# Patient Record
Sex: Male | Born: 1960 | Race: White | Hispanic: No | Marital: Single | Smoking: Never smoker
Health system: Southern US, Community
[De-identification: ages and names within clinical notes are randomized; demographics above are authoritative.]

## PROBLEM LIST (undated history)

## (undated) DIAGNOSIS — G4730 Sleep apnea, unspecified: Secondary | ICD-10-CM

## (undated) DIAGNOSIS — E785 Hyperlipidemia, unspecified: Secondary | ICD-10-CM

## (undated) DIAGNOSIS — I6529 Occlusion and stenosis of unspecified carotid artery: Secondary | ICD-10-CM

## (undated) DIAGNOSIS — Z8709 Personal history of other diseases of the respiratory system: Secondary | ICD-10-CM

## (undated) DIAGNOSIS — I2510 Atherosclerotic heart disease of native coronary artery without angina pectoris: Secondary | ICD-10-CM

## (undated) DIAGNOSIS — E119 Type 2 diabetes mellitus without complications: Secondary | ICD-10-CM

## (undated) DIAGNOSIS — K219 Gastro-esophageal reflux disease without esophagitis: Secondary | ICD-10-CM

## (undated) DIAGNOSIS — J329 Chronic sinusitis, unspecified: Secondary | ICD-10-CM

## (undated) DIAGNOSIS — Z8669 Personal history of other diseases of the nervous system and sense organs: Secondary | ICD-10-CM

## (undated) DIAGNOSIS — J45909 Unspecified asthma, uncomplicated: Secondary | ICD-10-CM

## (undated) DIAGNOSIS — K5090 Crohn's disease, unspecified, without complications: Secondary | ICD-10-CM

## (undated) DIAGNOSIS — I10 Essential (primary) hypertension: Secondary | ICD-10-CM

## (undated) DIAGNOSIS — L509 Urticaria, unspecified: Secondary | ICD-10-CM

## (undated) HISTORY — PX: OTHER SURGICAL HISTORY: SHX169

## (undated) HISTORY — PX: CAROTID ENDARTERECTOMY: SUR193

## (undated) HISTORY — DX: Urticaria, unspecified: L50.9

---

## 2019-10-05 NOTE — Progress Notes (Addendum)
New Bryan Lopez Note  RE: Bryan Lopez MRN: [id number] DOB: Aug 26, 1960 Date of Office Visit: 10/06/2019  Referring provider: No ref. provider found Primary care provider: Patient, [name]  Chief Complaint: Establish Care (needs an immunologist)  History of Present Illness: I had the pleasure of seeing Bryan Lopez for initial evaluation at the [facility] on 10/08/2019. He is a 59 y.o. male, who is self-referred here for the evaluation of establishing care with an allergist. Up to date with COVID-19 vaccine: no  Bryan Lopez just moved from [place] and was seen by allergy/immunology for chronic bronchitis, asthma on Dupixent, and sinusitis - in process of obtaining records.   Breathing:  He reports symptoms of chest tightness, shortness of breath, coughing (whitish to clear/yellow), wheezing, nocturnal awakenings for 3-4 years. Current medications include Trelegy 200 1 puff QD x 1 month, Singulair QHS and albuterol prn which help. He reports not using aerochamber with inhalers. He tried the following inhalers: Symbicort. Main triggers are hot weather, exertion. In the last month, frequency of symptoms: 1-3x/day. Frequency of nocturnal symptoms: nightly. Frequency of SABA use: 1-3x/day. Interference with physical activity: yes. Sleep is disturbed. In the last 12 months, emergency room visits/urgent care visits/doctor office visits or hospitalizations due to respiratory issues: every 2 weeks. In the last 12 months, oral steroids courses: 7-8 times with minimal benefit. Lifetime history of hospitalization for respiratory issues: yes about 1-2 years ago. Prior intubations: no. History of pneumonia: yes. He was evaluated by allergist/pulmonologist in the past. Smoking exposure: no. Up to date with flu vaccine: yes. Up to date with pneumonia vaccine: yes. Up to date with COVID-19 vaccine: no.  History of reflux: yes and takes Protonix daily.  Had recent CXR per Bryan Lopez report.   Bryan Lopez  started Dupixent injections in [place] every 2 weeks and had 3 injections to date with unknown benefit. He is due for his next injection.   Rhinitis: He reports symptoms of nasal congestion, rhinorrhea, PND, sneezing and slight itchy/watery eyes. Symptoms have been going on for 3-4 years. The symptoms are present all year around. Anosmia: diminished sense of smell. Headache: yes. He has used Flonase, Singulair, Careers adviser  with some improvement in symptoms. Sinus infections: yes. Previous work up includes: skin testing a few months ago was positive to ragweed, grass, dust mites and cockroach per Bryan Lopez report. No previous allergy injections.  Previous ENT evaluation: yes 6-7 surgeries Previous sinus imaging: yes. History of nasal polyps: no. Last eye exam: 4 months ago.  Infections:  Bryan Lopez has history multiple infections including sinus infection, pneumonia, bronchitis, ear infections. Denies any GI infections, skin infections/abscesses. Bryan Lopez has no history of opportunistic infections including fungal infections, viral infections.   Bryan Lopez reports 6-8 antibiotic use in the last 12 months.  Bryan Lopez has diabetes, heart disease s/p cardiac stents, hyperlipidemia, hypertension, Crohn's disease.   Bryan Lopez does not have any secondary causes of immunodeficiency including chronic steroid use, protein losing enteropathy, renal or hepatic dysfunction, history of cancer or irradiation or history of HIV, hepatitis B or C.  Bryan Lopez was told to get his pneumonia vaccine by allergy.   Assessment and Plan: Daymien is a 59 y.o. male with: Not well controlled severe persistent asthma Diagnosed with asthma 3-4 years ago. Currently on Trelegy 200 1 puff daily x 1 month, Singulair, albuterol prn. Recently started on Dupixent injections in [place] and due for his injection. 7-8 courses of prednisone per year with minimal benefit. Has reflux and takes Protonix. Not  up to date with COVID-19 vaccine. Had recent CXR -  awaiting records from previous allergist.   Today's spirometry showed some restriction.  Discussed with Bryan Lopez at length that I need to review his previous allergist's records and I may order some additional testing/bloodwork depending on what was done to date.  Will also call him about re-starting Dupixent injections.  . Daily controller medication(s): continue with Trelegy 1 puff daily and rinse mouth afterwards. . May use albuterol rescue inhaler 2 puffs every 4 to 6 hours as needed for shortness of breath, chest tightness, coughing, and wheezing. May use albuterol rescue inhaler 2 puffs 5 to 15 minutes prior to strenuous physical activities. Monitor frequency of use.  . Repeat spirometry at next visit. . Will check when last CXR was ordered.  . If no full body PFT - will need to get one in future. . Check for alpha-1 antitrypsin level as well.  . Use flutter valve to help with mucous clearance.  History of frequent upper respiratory infection Frequent sinusitis, pneumonia, bronchitis and ear infections. 6-8 courses of antibiotics the last 12 months. It sounds like his previous allergist may have done some basic immune bloodwork - awaiting records.  Depending on what was done, we may have to get additional bloodwork.  Okay to get the pneumonia vaccine and will check titers 4 weeks afterwards  Asked Bryan Lopez to hold given notes from allergist. Will check bloodwork first.   Keep track of infections.   Other allergic rhinitis Perennial rhinoconjunctivitis symptoms for the past 3 to 4 years.  Used Flonase, Singulair and Allegra with some benefit.  Apparently he had 6-7 sinus surgeries due to MRSA infection.  No history of polyps. skin testing a few months ago was positive to ragweed, grass, dust mites and cockroach per Bryan Lopez report.  No previous allergy immunotherapy.  Start environmental control measures as below.  May use over the counter antihistamines such as Zyrtec  (cetirizine), Claritin (loratadine), Allegra (fexofenadine), or Xyzal (levocetirizine) daily as needed.  May use Flonase 1 spray per nostril 1-2 times a day for nasal congestion.  Continue with Singulair daily.  Nasal saline spray (i.e., Simply Saline) or nasal saline lavage (i.e., NeilMed) is recommended as needed and prior to medicated nasal sprays.  Urticaria History of hives outbreak about 5 years ago with no known triggers.  Monitor symptoms and if has a recurrence then will do additional work-up at that time.  Return in about 4 weeks (around 11/03/2019).   Advised Bryan Lopez to establish care with a PCP in [place] as soon as possible as he has a complicated medical history.   ADDENDUM: Received some records from previous allergist Dr. Margit Banda from [place]. Severe persistent asthma with nasal polyposis. Started on Dupixent but issues with insurance.  Frequent infections needing 10+ courses of antibiotics and 7 courses of prednisone within 12 month.  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  Consider starting on IgG replacement therapy.   Other allergy screening: Food allergy: no Medication allergy:  Metformin - GI symptoms.  Hymenoptera allergy: no Urticaria:  5 years ago had hives with no known triggers noted.  Eczema:no  Diagnostics: Spirometry:  Tracings reviewed. His effort: Good reproducible efforts. FVC: 3.13L FEV1: 2.67L, 84% predicted FEV1/FVC ratio: 85% Interpretation: Spirometry consistent with possible restrictive disease.  Please see scanned spirometry results for details.  Skin Testing: Deferred due to recent antihistamines use.  Past Medical History: Bryan Lopez  Active Problem List   Diagnosis Date Noted  . History of frequent upper respiratory infection 10/07/2019  . Other allergic rhinitis 10/07/2019  . Urticaria 10/07/2019  . Not well controlled severe persistent asthma 10/06/2019   Past  Medical History:  Diagnosis Date  . Urticaria    Past Surgical History: History reviewed. No pertinent surgical history. Medication List:  Current Outpatient Medications  Medication Sig Dispense Refill  . albuterol (VENTOLIN HFA) 108 (90 Base) MCG/ACT inhaler     . atorvastatin (LIPITOR) 80 MG tablet Take 80 mg by mouth daily.    . DUPIXENT 300 MG/2ML prefilled syringe     . EPINEPHrine 0.3 mg/0.3 mL IJ SOAJ injection     . fentaNYL (DURAGESIC) 50 MCG/HR 1 patch every 3 (three) days.    . fluticasone (FLONASE) 50 MCG/ACT nasal spray Place 1 spray into both nostrils 2 (two) times daily.    Marland Kitchen gabapentin (NEURONTIN) 600 MG tablet     . JARDIANCE 25 MG TABS tablet Take 25 mg by mouth daily.    . methylPREDNISolone (MEDROL DOSEPAK) 4 MG TBPK tablet     . montelukast (SINGULAIR) 10 MG tablet Take 10 mg by mouth daily.    . nortriptyline (PAMELOR) 25 MG capsule Take 25 mg by mouth at bedtime.    . ondansetron (ZOFRAN) 8 MG tablet     . ONETOUCH ULTRA test strip 1 each daily.    . Oxycodone HCl 10 MG TABS Take 10 mg by mouth 5 (five) times daily as needed.    . pantoprazole (PROTONIX) 40 MG tablet Take 40 mg by mouth daily.    Viviana Simpler ELLIPTA 200-62.5-25 MCG/INH AEPB     . valsartan (DIOVAN) 160 MG tablet Take 160 mg by mouth daily.     No current facility-administered medications for this visit.   Allergies: No Known Allergies Social History: Social History   Socioeconomic History  . Marital status: Single    Spouse name: Not on file  . Number of children: Not on file  . Years of education: Not on file  . Highest education level: Not on file  Occupational History  . Not on file  Tobacco Use  . Smoking status: Never Smoker  . Smokeless tobacco: Never Used  Substance and Sexual Activity  . Alcohol use: Never  . Drug use: Never  . Sexual activity: Not on file  Other Topics Concern  . Not on file  Social History Narrative  . Not on file   Social Determinants of Health    Financial Resource Strain:   . Difficulty of Paying Living Expenses:   Food Insecurity:   . Worried About Charity fundraiser in the Last Year:   . Arboriculturist in the Last Year:   Transportation Needs:   . Film/video editor (Medical):   Marland Kitchen Lack of Transportation (Non-Medical):   Physical Activity:   . Days of Exercise per Week:   . Minutes of Exercise per Session:   Stress:   . Feeling of Stress :   Social Connections:   . Frequency of Communication with Friends and Family:   . Frequency of Social Gatherings with Friends and Family:   . Attends Religious Services:   . Active Member of Clubs or Organizations:   . Attends Archivist Meetings:   Marland Kitchen Marital Status:    Lives in a motel currently. Smoking: denies Occupation: tool and Set designer History: Water Damage/mildew in the house: no  Carpet in the family room: no Carpet in the bedroom: no Heating: heat pump Cooling: central Pet: 1 dog x 3 yrs  Family History: Family History  Problem Relation Age of Onset  . Asthma Father   . Allergic rhinitis Neg Hx   . Atopy Neg Hx    Review of Systems  Constitutional: Negative for appetite change, chills, fever and unexpected weight change.  HENT: Positive for congestion, postnasal drip, rhinorrhea and sneezing.   Eyes: Positive for itching.  Respiratory: Positive for cough, chest tightness, shortness of breath and wheezing.   Cardiovascular: Negative for chest pain.  Gastrointestinal: Negative for abdominal pain.  Genitourinary: Negative for difficulty urinating.  Skin: Negative for rash.  Allergic/Immunologic: Positive for environmental allergies. Negative for food allergies.  Neurological: Positive for headaches.   Objective: BP 140/82 (BP Location: Right Arm, Bryan Lopez Position: Sitting, Cuff Size: Normal)   Pulse 100   Temp 98.2 F (36.8 C) (Temporal)   Resp 16   Ht 5' 7.13" (1.705 m)   Wt 196 lb (88.9 kg)   SpO2 96%   BMI 30.58  kg/m  Body mass index is 30.58 kg/m. Physical Exam Vitals and nursing note reviewed.  Constitutional:      Appearance: Normal appearance. He is well-developed.  HENT:     Head: Normocephalic and atraumatic.     Right Ear: Tympanic membrane and external ear normal.     Left Ear: Tympanic membrane and external ear normal.     Nose: Nose normal. No congestion or rhinorrhea.     Mouth/Throat:     Mouth: Mucous membranes are moist.     Pharynx: Oropharynx is clear.  Eyes:     Conjunctiva/sclera: Conjunctivae normal.  Cardiovascular:     Rate and Rhythm: Normal rate and regular rhythm.     Heart sounds: Normal heart sounds. No murmur heard.  No friction rub. No gallop.   Pulmonary:     Effort: Pulmonary effort is normal.     Breath sounds: Rhonchi present. No wheezing or rales.  Abdominal:     Palpations: Abdomen is soft.  Musculoskeletal:     Cervical back: Neck supple.  Skin:    General: Skin is warm.     Findings: No rash.  Neurological:     Mental Status: He is alert and oriented to person, place, and time.  Psychiatric:        Behavior: Behavior normal.    The plan was reviewed with the Bryan Lopez/family, and all questions/concerned were addressed.  It was my pleasure to see Jazziel today and participate in his care. Please feel free to contact me with any questions or concerns.  Sincerely,  Wyline Mood, DO Allergy & Immunology  [facility] office: [phone] [facility] office: [phone] [place] office: [phone]

## 2019-10-06 ENCOUNTER — Other Ambulatory Visit: Payer: Self-pay

## 2019-10-06 ENCOUNTER — Encounter: Payer: Self-pay | Admitting: Allergy

## 2019-10-06 ENCOUNTER — Ambulatory Visit (INDEPENDENT_AMBULATORY_CARE_PROVIDER_SITE_OTHER): Payer: BC Managed Care – PPO | Admitting: Allergy

## 2019-10-06 VITALS — BP 140/82 | HR 100 | Temp 98.2°F | Resp 16 | Ht 67.13 in | Wt 196.0 lb

## 2019-10-06 DIAGNOSIS — L509 Urticaria, unspecified: Secondary | ICD-10-CM | POA: Diagnosis not present

## 2019-10-06 DIAGNOSIS — J3089 Other allergic rhinitis: Secondary | ICD-10-CM

## 2019-10-06 DIAGNOSIS — J4550 Severe persistent asthma, uncomplicated: Secondary | ICD-10-CM | POA: Diagnosis not present

## 2019-10-06 DIAGNOSIS — Z8709 Personal history of other diseases of the respiratory system: Secondary | ICD-10-CM

## 2019-10-06 NOTE — Patient Instructions (Addendum)
I will review your previous allergist's records and depending on what they ordered, I may order additional bloodwork.  Okay to get second pneumonia vaccine.  Keep track of infections.   We will call you Thursday about the Dupixent injecion.   Asthma: . Daily controller medication(s): continue with Trelegy 1 puff daily and rinse mouth afterwards. . May use albuterol rescue inhaler 2 puffs every 4 to 6 hours as needed for shortness of breath, chest tightness, coughing, and wheezing. May use albuterol rescue inhaler 2 puffs 5 to 15 minutes prior to strenuous physical activities. Monitor frequency of use.  . Asthma control goals:  o Full participation in all desired activities (may need albuterol before activity) o Albuterol use two times or less a week on average (not counting use with activity) o Cough interfering with sleep two times or less a month o Oral steroids no more than once a year o No hospitalizations  Environmental allergies  Start environmental control measures as below.  May use over the counter antihistamines such as Zyrtec (cetirizine), Claritin (loratadine), Allegra (fexofenadine), or Xyzal (levocetirizine) daily as needed.  May use Flonase 1 spray per nostril 1-2 times a day for nasal congestion.  Continue with Singulair daily.  Nasal saline spray (i.e., Simply Saline) or nasal saline lavage (i.e., NeilMed) is recommended as needed and prior to medicated nasal sprays.  Follow up in 1 months or sooner if needed.   Please establish care with primary care in [place].   Reducing Pollen Exposure . Pollen seasons: trees (spring), grass (summer) and ragweed/weeds (fall). Marland Kitchen Keep windows closed in your home and car to lower pollen exposure.  Lilian Kapur air conditioning in the bedroom and throughout the house if possible.  . Avoid going out in dry windy days - especially early morning. . Pollen counts are highest between 5 - 10 AM and on dry, hot and windy days.  . Save  outside activities for late afternoon or after a heavy rain, when pollen levels are lower.  . Avoid mowing of grass if you have grass pollen allergy. Marland Kitchen Be aware that pollen can also be transported indoors on people and pets.  . Dry your clothes in an automatic dryer rather than hanging them outside where they might collect pollen.  . Rinse hair and eyes before bedtime.  Control of House Dust Mite Allergen . Dust mite allergens are a common trigger of allergy and asthma symptoms. While they can be found throughout the house, these microscopic creatures thrive in warm, humid environments such as bedding, upholstered furniture and carpeting. . Because so much time is spent in the bedroom, it is essential to reduce mite levels there.  . Encase pillows, mattresses, and box springs in special allergen-proof fabric covers or airtight, zippered plastic covers.  . Bedding should be washed weekly in hot water (130 F) and dried in a hot dryer. Allergen-proof covers are available for comforters and pillows that can't be regularly washed.  Reyes Ivan the allergy-proof covers every few months. Minimize clutter in the bedroom. Keep pets out of the bedroom.  Marland Kitchen Keep humidity less than 50% by using a dehumidifier or air conditioning. You can buy a humidity measuring device called a hygrometer to monitor this.  . If possible, replace carpets with hardwood, linoleum, or washable area rugs. If that's not possible, vacuum frequently with a vacuum that has a HEPA filter. . Remove all upholstered furniture and non-washable window drapes from the bedroom. . Remove all non-washable stuffed toys from  the bedroom.  Wash stuffed toys weekly.  Cockroach Allergen Avoidance Cockroaches are often found in the homes of densely populated urban areas, schools or commercial buildings, but these creatures can lurk almost anywhere. This does not mean that you have a dirty house or living area. . Block all areas where roaches can enter the  home. This includes crevices, wall cracks and windows.  . Cockroaches need water to survive, so fix and seal all leaky faucets and pipes. Have an exterminator go through the house when your family and pets are gone to eliminate any remaining roaches. Marland Kitchen Keep food in lidded containers and put pet food dishes away after your pets are done eating. Vacuum and sweep the floor after meals, and take out garbage and recyclables. Use lidded garbage containers in the kitchen. Wash dishes immediately after use and clean under stoves, refrigerators or toasters where crumbs can accumulate. Wipe off the stove and other kitchen surfaces and cupboards regularly.

## 2019-10-07 DIAGNOSIS — J3089 Other allergic rhinitis: Secondary | ICD-10-CM | POA: Insufficient documentation

## 2019-10-07 DIAGNOSIS — Z8709 Personal history of other diseases of the respiratory system: Secondary | ICD-10-CM | POA: Insufficient documentation

## 2019-10-07 DIAGNOSIS — L509 Urticaria, unspecified: Secondary | ICD-10-CM | POA: Insufficient documentation

## 2019-10-07 NOTE — Assessment & Plan Note (Addendum)
Frequent sinusitis, pneumonia, bronchitis and ear infections. 6-8 courses of antibiotics the last 12 months. It sounds like his previous allergist may have done some basic immune bloodwork - awaiting records.  Depending on what was done, we may have to get additional bloodwork.  Okay to get the pneumonia vaccine and will check titers 4 weeks afterwards  Asked patient to hold given notes from allergist. Will check bloodwork first.   Keep track of infections.

## 2019-10-07 NOTE — Assessment & Plan Note (Signed)
Perennial rhinoconjunctivitis symptoms for the past 3 to 4 years.  Used Flonase, Singulair and Allegra with some benefit.  Apparently he had 6-7 sinus surgeries due to MRSA infection.  No history of polyps. skin testing a few months ago was positive to ragweed, grass, dust mites and cockroach per patient report.  No previous allergy immunotherapy.  Start environmental control measures as below.  May use over the counter antihistamines such as Zyrtec (cetirizine), Claritin (loratadine), Allegra (fexofenadine), or Xyzal (levocetirizine) daily as needed.  May use Flonase 1 spray per nostril 1-2 times a day for nasal congestion.  Continue with Singulair daily.  Nasal saline spray (i.e., Simply Saline) or nasal saline lavage (i.e., NeilMed) is recommended as needed and prior to medicated nasal sprays.

## 2019-10-07 NOTE — Assessment & Plan Note (Signed)
History of hives outbreak about 5 years ago with no known triggers.  Monitor symptoms and if has a recurrence then will do additional work-up at that time.

## 2019-10-07 NOTE — Assessment & Plan Note (Signed)
Diagnosed with asthma 3-4 years ago. Currently on Trelegy 200 1 puff daily x 1 month, Singulair, albuterol prn. Recently started on Dupixent injections in [place] and due for his injection. 7-8 courses of prednisone per year with minimal benefit. Has reflux and takes Protonix. Not up to date with COVID-19 vaccine. Had recent CXR - awaiting records from previous allergist.   Today's spirometry showed some restriction.  Discussed with patient at length that I need to review his previous allergist's records and I may order some additional testing/bloodwork depending on what was done to date.  Will also call him about re-starting Dupixent injections.  . Daily controller medication(s): continue with Trelegy 1 puff daily and rinse mouth afterwards. . May use albuterol rescue inhaler 2 puffs every 4 to 6 hours as needed for shortness of breath, chest tightness, coughing, and wheezing. May use albuterol rescue inhaler 2 puffs 5 to 15 minutes prior to strenuous physical activities. Monitor frequency of use.  . Repeat spirometry at next visit. . Will check when last CXR was ordered.  . If no full body PFT - will need to get one in future. . Check for alpha-1 antitrypsin level as well.  . Use flutter valve to help with mucous clearance.

## 2019-10-08 ENCOUNTER — Other Ambulatory Visit: Payer: Self-pay

## 2019-10-08 ENCOUNTER — Ambulatory Visit (INDEPENDENT_AMBULATORY_CARE_PROVIDER_SITE_OTHER): Payer: BC Managed Care – PPO

## 2019-10-08 DIAGNOSIS — J4550 Severe persistent asthma, uncomplicated: Secondary | ICD-10-CM | POA: Diagnosis not present

## 2019-10-08 MED ORDER — DUPILUMAB 300 MG/2ML ~~LOC~~ SOSY
300.0000 mg | PREFILLED_SYRINGE | Freq: Once | SUBCUTANEOUS | Status: AC
  Administered 2019-10-08: 300 mg via SUBCUTANEOUS

## 2019-10-08 NOTE — Progress Notes (Signed)
Immunotherapy   Patient Details  Name: Bryan Lopez MRN: [id number] Date of Birth: 05/17/1960  10/08/2019  Bryan Lopez started Dupixent in our office for asthma however patient has been on Dupixent from previous allergist. Patient was receiving Dupixent every 2 weeks with his last injection on 09/23/2019. Patient did not wait 30 minutes. Frequency: Every 2 weeks Epi-Pen: Yes Consent signed and patient instructions given.   Bryan Lopez 10/08/2019, 3:51 PM

## 2019-10-08 NOTE — Addendum Note (Signed)
Addended by: Ellamae Sia on: 10/08/2019 12:58 PM   Modules accepted: Orders

## 2019-10-13 ENCOUNTER — Telehealth: Payer: Self-pay | Admitting: *Deleted

## 2019-10-13 NOTE — Telephone Encounter (Signed)
Called patient and advised process to get approval for Dupixent. Obtained [employer] info from patient. Will reach out patient once approved for submit

## 2019-10-13 NOTE — Telephone Encounter (Signed)
-----   Message from Ellamae Sia, DO sent at 10/08/2019  3:59 PM EDT ----- Regarding: PA dupixent Please start PA for Dupixent for asthma. Patient was started on Dupixent by his previous allergist in [place] but recently moved to [facility]. I didn't receive all his notes but the last OV note mentioned something about denial? Not sure why. I'm awaiting the rest of his records. We are giving him a sample dose today as he is due for his Dupixent injection.  Thank you.

## 2019-10-22 ENCOUNTER — Ambulatory Visit: Payer: BC Managed Care – PPO

## 2019-10-30 ENCOUNTER — Telehealth: Payer: Self-pay | Admitting: Allergy

## 2019-10-30 ENCOUNTER — Encounter: Payer: Self-pay | Admitting: Allergy

## 2019-10-30 NOTE — Telephone Encounter (Signed)
Please call patient.  I was reviewing his previous allergist notes and did not get his labwork results.  Did he get it drawn? Also, he was supposed to come in for Dupixent injections. Did that get approved?  Thank you.

## 2019-10-30 NOTE — Progress Notes (Signed)
Reviewed notes sent from previous allergist DR. Darrick Grinder from [place]. See scanned notes.  IgG 855, IgA normal 99, IgM 39L and IgE 10 IgG1 494 IgG2 227L IgG3 9L IgG4 50 Good post prevnar response.

## 2019-10-30 NOTE — Telephone Encounter (Signed)
Spoke with patient and due to his work he totally forgot to get his labs drawn. Patient stated that he will come into the [place] office on Monday to have them done. Patient also informed me that he has spoke with Tammy and has been approved for Dupixent. He will come in next Thursday 11/05/2019 and receive his injection.

## 2019-11-05 ENCOUNTER — Ambulatory Visit: Payer: Self-pay

## 2019-11-10 ENCOUNTER — Telehealth: Payer: Self-pay

## 2019-11-10 LAB — STREP PNEUMONIAE 23 SEROTYPES IGG
Pneumo Ab Type 1*: 2.6 ug/mL (ref >1.3)
Pneumo Ab Type 12 (12F)*: 1 ug/mL — ABNORMAL LOW (ref >1.3)
Pneumo Ab Type 14*: 6.7 ug/mL (ref >1.3)
Pneumo Ab Type 17 (17F)*: 2 ug/mL (ref >1.3)
Pneumo Ab Type 19 (19F)*: 10.1 ug/mL (ref >1.3)
Pneumo Ab Type 2*: 7.5 ug/mL (ref >1.3)
Pneumo Ab Type 20*: >40.2 ug/mL (ref >1.3)
Pneumo Ab Type 22 (22F)*: 5.2 ug/mL (ref >1.3)
Pneumo Ab Type 23 (23F)*: 6 ug/mL (ref >1.3)
Pneumo Ab Type 26 (6B)*: 2.5 ug/mL (ref >1.3)
Pneumo Ab Type 3*: 9.4 ug/mL (ref >1.3)
Pneumo Ab Type 34 (10A)*: >25.3 ug/mL (ref >1.3)
Pneumo Ab Type 4*: 1.5 ug/mL (ref >1.3)
Pneumo Ab Type 43 (11A)*: 10.2 ug/mL (ref >1.3)
Pneumo Ab Type 5*: 11.2 ug/mL (ref >1.3)
Pneumo Ab Type 51 (7F)*: 17.2 ug/mL (ref >1.3)
Pneumo Ab Type 54 (15B)*: 12.5 ug/mL (ref >1.3)
Pneumo Ab Type 56 (18C)*: 11.5 ug/mL (ref >1.3)
Pneumo Ab Type 57 (19A)*: 12.1 ug/mL (ref >1.3)
Pneumo Ab Type 68 (9V)*: 5.3 ug/mL (ref >1.3)
Pneumo Ab Type 70 (33F)*: 8.7 ug/mL (ref >1.3)
Pneumo Ab Type 8*: 15.7 ug/mL (ref >1.3)
Pneumo Ab Type 9 (9N)*: 1.3 ug/mL — ABNORMAL LOW (ref >1.3)

## 2019-11-10 LAB — CBC WITH DIFFERENTIAL/PLATELET
Basophils Absolute: 0 10*3/uL (ref 0.0–0.2)
Basos: 1 %
EOS (ABSOLUTE): 0.2 10*3/uL (ref 0.0–0.4)
Eos: 4 %
Hematocrit: 42.2 % (ref 37.5–51.0)
Hemoglobin: 14.6 g/dL (ref 13.0–17.7)
Immature Grans (Abs): 0 10*3/uL (ref 0.0–0.1)
Immature Granulocytes: 1 %
Lymphocytes Absolute: 1.4 10*3/uL (ref 0.7–3.1)
Lymphs: 33 %
MCH: 29.7 pg (ref 26.6–33.0)
MCHC: 34.6 g/dL (ref 31.5–35.7)
MCV: 86 fL (ref 79–97)
Monocytes Absolute: 0.3 10*3/uL (ref 0.1–0.9)
Monocytes: 8 %
Neutrophils Absolute: 2.3 10*3/uL (ref 1.4–7.0)
Neutrophils: 53 %
Platelets: 247 10*3/uL (ref 150–450)
RBC: 4.92 x10E6/uL (ref 4.14–5.80)
RDW: 13 % (ref 11.6–15.4)
WBC: 4.2 10*3/uL (ref 3.4–10.8)

## 2019-11-10 LAB — IGG 1, 2, 3, AND 4
IgG (Immunoglobin G), Serum: 927 mg/dL (ref 603–1613)
IgG, Subclass 1: 510 mg/dL (ref 248–810)
IgG, Subclass 2: 297 mg/dL (ref 130–555)
IgG, Subclass 3: 6 mg/dL — ABNORMAL LOW (ref 15–102)
IgG, Subclass 4: 60 mg/dL (ref 2–96)

## 2019-11-10 LAB — COMPLEMENT, TOTAL: Compl, Total (CH50): >60 U/mL (ref >41)

## 2019-11-10 LAB — DIPHTHERIA / TETANUS ANTIBODY PANEL
Diphtheria Ab: 0.13 IU/mL (ref <0.10)
Tetanus Ab, IgG: 3.11 IU/mL (ref <0.10)

## 2019-11-10 LAB — ALPHA-1-ANTITRYPSIN: A-1 Antitrypsin: 118 mg/dL (ref 101–187)

## 2019-11-10 LAB — IGG, IGA, IGM
IgA/Immunoglobulin A, Serum: 108 mg/dL (ref 61–437)
IgM (Immunoglobulin M), Srm: 38 mg/dL (ref 20–172)

## 2019-11-10 MED ORDER — TRELEGY ELLIPTA 200-62.5-25 MCG/INH IN AEPB: 1.0000 | INHALATION_SPRAY | Freq: Every day | RESPIRATORY_TRACT | 0 refills | Status: DC

## 2019-11-10 NOTE — Telephone Encounter (Signed)
Patient needs refill on Trelegy sent to [place] address because that's where he is until another three days.

## 2019-11-16 NOTE — Progress Notes (Deleted)
Follow Up Note  RE: Bryan Lopez MRN: [id number] DOB: 09-04-1960 Date of Office Visit: 11/17/2019  Referring provider: No ref. provider found Primary care provider: Patient, [name]  Chief Complaint: No chief complaint on file.  History of Present Illness: I had the pleasure of seeing Bryan Lopez for a follow up visit at the [facility] on 11/16/2019. He is a 59 y.o. male, who is being followed for asthma on Dupixent injections, h/o frequent URIs, allergic rhino conjunctivitis and h/o hves. His previous allergy office visit was on 10/06/2019 with Dr. Selena Batten. Today is a regular follow up visit.  Not well controlled severe persistent asthma Diagnosed with asthma 3-4 years ago. Currently on Trelegy 200 1 puff daily x 1 month, Singulair, albuterol prn. Recently started on Dupixent injections in [place] and due for his injection. 7-8 courses of prednisone per year with minimal benefit. Has reflux and takes Protonix. Not up to date with COVID-19 vaccine. Had recent CXR - awaiting records from previous allergist.   Today's spirometry showed some restriction.  Discussed with patient at length that I need to review his previous allergist's records and I may order some additional testing/bloodwork depending on what was done to date.  Will also call him about re-starting Dupixent injections.   Daily controller medication(s):continue with Trelegy 1 puff daily and rinse mouth afterwards.  May use albuterol rescue inhaler 2 puffs every 4 to 6 hours as needed for shortness of breath, chest tightness, coughing, and wheezing. May use albuterol rescue inhaler 2 puffs 5 to 15 minutes prior to strenuous physical activities. Monitor frequency of use.   Repeat spirometry at next visit.  Will check when last CXR was ordered.   If no full body PFT - will need to get one in future.  Check for alpha-1 antitrypsin level as well.   Use flutter valve to help with mucous  clearance.  History of frequent upper respiratory infection Frequent sinusitis, pneumonia, bronchitis and ear infections. 6-8 courses of antibiotics the last 12 months. It sounds like his previous allergist may have done some basic immune bloodwork - awaiting records.  Depending on what was done, we may have to get additional bloodwork.  Okay to get the pneumonia vaccine and will check titers 4 weeks afterwards ? Asked patient to hold given notes from allergist. Will check bloodwork first.   Keep track of infections.   Other allergic rhinitis Perennial rhinoconjunctivitis symptoms for the past 3 to 4 years.  Used Flonase, Singulair and Allegra with some benefit.  Apparently he had 6-7 sinus surgeries due to MRSA infection.  No history of polyps. skin testing a few months ago was positive to ragweed, grass, dust mites and cockroach per patient report.  No previous allergy immunotherapy.  Start environmental control measures as below.  May use over the counter antihistamines such as Zyrtec (cetirizine), Claritin (loratadine), Allegra (fexofenadine), or Xyzal (levocetirizine) daily as needed.  May use Flonase 1 spray per nostril 1-2 times a day for nasal congestion.  Continue with Singulair daily.  Nasal saline spray (i.e., Simply Saline) or nasal saline lavage (i.e., NeilMed) is recommended as needed and prior to medicated nasal sprays.  Urticaria History of hives outbreak about 5 years ago with no known triggers.  Monitor symptoms and if has a recurrence then will do additional work-up at that time.  Return in about 4 weeks (around 11/03/2019).   Advised patient to establish care with a PCP in [place] as soon  as possible as he has a complicated medical history.   ADDENDUM: Received some records from previous allergist Dr. Margit Banda from [place]. Severe persistent asthma with nasal polyposis. Started on Dupixent but issues with insurance.  Frequent infections needing 10+ courses of  antibiotics and 7 courses of prednisone within 12 month.  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  Consider starting on IgG replacement therapy.   IgG 855, IgA normal 99, IgM 39L and IgE 10 IgG1 494 IgG2 227L IgG3 9L IgG4 50 Good post prevnar response.   Cbc - blood count normal. Pneumococcal titers showed a good response - no need for additional pneumonia shots right now.  Diptheria and tetanus titers were protective as well. Normal alpha 1 level - checks for a lung disease. IgA, IgM and IgG levels were normal. IgG3 subclass was lower than normal.   Assessment and Plan: Bryan Lopez is a 59 y.o. male with: No problem-specific Assessment & Plan notes found for this encounter.  No follow-ups on file.  No orders of the defined types were placed in this encounter.  Lab Orders  No laboratory test(s) ordered today    Diagnostics: Spirometry:  Tracings reviewed. His effort: {Blank single:19197::"Good reproducible efforts.","It was hard to get consistent efforts and there is a question as to whether this reflects a maximal maneuver.","Poor effort, data can not be interpreted."} FVC: ***L FEV1: ***L, ***% predicted FEV1/FVC ratio: ***% Interpretation: {Blank single:19197::"Spirometry consistent with mild obstructive disease","Spirometry consistent with moderate obstructive disease","Spirometry consistent with severe obstructive disease","Spirometry consistent with possible restrictive disease","Spirometry consistent with mixed obstructive and restrictive disease","Spirometry uninterpretable due to technique","Spirometry consistent with normal pattern","No overt abnormalities noted given today's efforts"}.  Please see scanned spirometry results for details.  Skin Testing: {Blank single:19197::"Select foods","Environmental allergy panel","Environmental allergy panel and select foods","Food allergy panel","None","Deferred due to  recent antihistamines use"}. Positive test to: ***. Negative test to: ***.  Results discussed with patient/family.   Medication List:  Current Outpatient Medications  Medication Sig Dispense Refill   albuterol (VENTOLIN HFA) 108 (90 Base) MCG/ACT inhaler      atorvastatin (LIPITOR) 80 MG tablet Take 80 mg by mouth daily.     DUPIXENT 300 MG/2ML prefilled syringe      EPINEPHrine 0.3 mg/0.3 mL IJ SOAJ injection      fentaNYL (DURAGESIC) 50 MCG/HR 1 patch every 3 (three) days.     fluticasone (FLONASE) 50 MCG/ACT nasal spray Place 1 spray into both nostrils 2 (two) times daily.     gabapentin (NEURONTIN) 600 MG tablet      JARDIANCE 25 MG TABS tablet Take 25 mg by mouth daily.     methylPREDNISolone (MEDROL DOSEPAK) 4 MG TBPK tablet      montelukast (SINGULAIR) 10 MG tablet Take 10 mg by mouth daily.     nortriptyline (PAMELOR) 25 MG capsule Take 25 mg by mouth at bedtime.     ondansetron (ZOFRAN) 8 MG tablet      ONETOUCH ULTRA test strip 1 each daily.     Oxycodone HCl 10 MG TABS Take 10 mg by mouth 5 (five) times daily as needed.     pantoprazole (PROTONIX) 40 MG tablet Take 40 mg by mouth daily.     TRELEGY ELLIPTA 200-62.5-25 MCG/INH AEPB Inhale 1 puff into the lungs daily. 28 each 0   valsartan (DIOVAN) 160 MG tablet Take 160 mg by mouth daily.     No current facility-administered medications for this  visit.   Allergies: No Known Allergies I reviewed his past medical history, social history, family history, and environmental history and no significant changes have been reported from his previous visit.  Review of Systems  Constitutional: Negative for appetite change, chills, fever and unexpected weight change.  HENT: Positive for congestion, postnasal drip, rhinorrhea and sneezing.   Eyes: Positive for itching.  Respiratory: Positive for cough, chest tightness, shortness of breath and wheezing.   Cardiovascular: Negative for chest pain.  Gastrointestinal:  Negative for abdominal pain.  Genitourinary: Negative for difficulty urinating.  Skin: Negative for rash.  Allergic/Immunologic: Positive for environmental allergies. Negative for food allergies.  Neurological: Positive for headaches.   Objective: There were no vitals taken for this visit. There is no height or weight on file to calculate BMI. Physical Exam Vitals and nursing note reviewed.  Constitutional:      Appearance: Normal appearance. He is well-developed.  HENT:     Head: Normocephalic and atraumatic.     Right Ear: Tympanic membrane and external ear normal.     Left Ear: Tympanic membrane and external ear normal.     Nose: Nose normal. No congestion or rhinorrhea.     Mouth/Throat:     Mouth: Mucous membranes are moist.     Pharynx: Oropharynx is clear.  Eyes:     Conjunctiva/sclera: Conjunctivae normal.  Cardiovascular:     Rate and Rhythm: Normal rate and regular rhythm.     Heart sounds: Normal heart sounds. No murmur heard.  No friction rub. No gallop.   Pulmonary:     Effort: Pulmonary effort is normal.     Breath sounds: Rhonchi present. No wheezing or rales.  Abdominal:     Palpations: Abdomen is soft.  Musculoskeletal:     Cervical back: Neck supple.  Skin:    General: Skin is warm.     Findings: No rash.  Neurological:     Mental Status: He is alert and oriented to person, place, and time.  Psychiatric:        Behavior: Behavior normal.    Previous notes and tests were reviewed. The plan was reviewed with the patient/family, and all questions/concerned were addressed.  It was my pleasure to see Bryan Lopez today and participate in his care. Please feel free to contact me with any questions or concerns.  Sincerely,  Wyline Mood, DO Allergy & Immunology  [facility] office: [phone] [facility] office: [phone] [place] office: [phone]

## 2019-11-17 ENCOUNTER — Ambulatory Visit: Payer: Self-pay

## 2019-11-17 ENCOUNTER — Telehealth: Payer: Self-pay | Admitting: Allergy

## 2019-11-17 ENCOUNTER — Ambulatory Visit: Payer: BC Managed Care – PPO | Admitting: Allergy

## 2019-11-17 NOTE — Telephone Encounter (Signed)
Please call patient and see if he wants to the Dupixent injections at home as he is traveling a lot for work.  He does need to come in for an office visit when he is back inc [place].

## 2019-11-18 NOTE — Telephone Encounter (Signed)
Called however was unable to leave a message.

## 2019-11-19 ENCOUNTER — Other Ambulatory Visit: Payer: Self-pay | Admitting: Allergy

## 2019-11-24 NOTE — Telephone Encounter (Signed)
Spoke with patient and he stated he is finally staying in [place] and is finalizing his final move from [place]. Patient also state he prefers to get his Dupixent injections in the clinic since he is diabetic and would like to get injection in his arm due to scar tissue from diabetic injections. Patient believes he can be more consistent with his Dupixent schedule and will be in the office this Thursday.

## 2019-11-25 NOTE — Progress Notes (Deleted)
Follow Up Note  RE: Bryan Lopez MRN: [id number] DOB: December 24, 1960 Date of Office Visit: 11/26/2019  Referring provider: No ref. provider found Primary care provider: Patient, [name]  Chief Complaint: No chief complaint on file.  History of Present Illness: I had the pleasure of seeing Bryan Lopez for a follow up visit at the [facility] on 11/25/2019. He is a 59 y.o. male, who is being followed for asthma on Dupxient injections, h/o frequent URIs, allergic rhinitis, h/o hives. His previous allergy office visit was on 10/06/2019 with Dr. Selena Batten. Today is a regular follow up visit.  Not well controlled severe persistent asthma Diagnosed with asthma 3-4 years ago. Currently on Trelegy 200 1 puff daily x 1 month, Singulair, albuterol prn. Recently started on Dupixent injections in [place] and due for his injection. 7-8 courses of prednisone per year with minimal benefit. Has reflux and takes Protonix. Not up to date with COVID-19 vaccine. Had recent CXR - awaiting records from previous allergist.   Today's spirometry showed some restriction.  Discussed with patient at length that I need to review his previous allergist's records and I may order some additional testing/bloodwork depending on what was done to date.  Will also call him about re-starting Dupixent injections.   Daily controller medication(s):continue with Trelegy 1 puff daily and rinse mouth afterwards.  May use albuterol rescue inhaler 2 puffs every 4 to 6 hours as needed for shortness of breath, chest tightness, coughing, and wheezing. May use albuterol rescue inhaler 2 puffs 5 to 15 minutes prior to strenuous physical activities. Monitor frequency of use.   Repeat spirometry at next visit.  Will check when last CXR was ordered.   If no full body PFT - will need to get one in future.  Check for alpha-1 antitrypsin level as well.   Use flutter valve to help with mucous clearance.  History of  frequent upper respiratory infection Frequent sinusitis, pneumonia, bronchitis and ear infections. 6-8 courses of antibiotics the last 12 months. It sounds like his previous allergist may have done some basic immune bloodwork - awaiting records.  Depending on what was done, we may have to get additional bloodwork.  Okay to get the pneumonia vaccine and will check titers 4 weeks afterwards ? Asked patient to hold given notes from allergist. Will check bloodwork first.   Keep track of infections.   Other allergic rhinitis Perennial rhinoconjunctivitis symptoms for the past 3 to 4 years.  Used Flonase, Singulair and Allegra with some benefit.  Apparently he had 6-7 sinus surgeries due to MRSA infection.  No history of polyps. skin testing a few months ago was positive to ragweed, grass, dust mites and cockroach per patient report.  No previous allergy immunotherapy.  Start environmental control measures as below.  May use over the counter antihistamines such as Zyrtec (cetirizine), Claritin (loratadine), Allegra (fexofenadine), or Xyzal (levocetirizine) daily as needed.  May use Flonase 1 spray per nostril 1-2 times a day for nasal congestion.  Continue with Singulair daily.  Nasal saline spray (i.e., Simply Saline) or nasal saline lavage (i.e., NeilMed) is recommended as needed and prior to medicated nasal sprays.  Urticaria History of hives outbreak about 5 years ago with no known triggers.  Monitor symptoms and if has a recurrence then will do additional work-up at that time.  Return in about 4 weeks (around 11/03/2019).   Advised patient to establish care with a PCP in  as soon as possible  as he has a complicated medical history.   ADDENDUM: Received some records from previous allergist Dr. Margit Banda from [place]. Severe persistent asthma with nasal polyposis. Started on Dupixent but issues with insurance.  Frequent infections needing 10+ courses of antibiotics and 7 courses of  prednisone within 12 month.  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  Consider starting on IgG replacement therapy.   Assessment and Plan: Bryan Lopez is a 59 y.o. male with: No problem-specific Assessment & Plan notes found for this encounter.  No follow-ups on file.  No orders of the defined types were placed in this encounter.  Lab Orders  No laboratory test(s) ordered today    Diagnostics: Spirometry:  Tracings reviewed. His effort: {Blank single:19197::"Good reproducible efforts.","It was hard to get consistent efforts and there is a question as to whether this reflects a maximal maneuver.","Poor effort, data can not be interpreted."} FVC: ***L FEV1: ***L, ***% predicted FEV1/FVC ratio: ***% Interpretation: {Blank single:19197::"Spirometry consistent with mild obstructive disease","Spirometry consistent with moderate obstructive disease","Spirometry consistent with severe obstructive disease","Spirometry consistent with possible restrictive disease","Spirometry consistent with mixed obstructive and restrictive disease","Spirometry uninterpretable due to technique","Spirometry consistent with normal pattern","No overt abnormalities noted given today's efforts"}.  Please see scanned spirometry results for details.  Skin Testing: {Blank single:19197::"Select foods","Environmental allergy panel","Environmental allergy panel and select foods","Food allergy panel","None","Deferred due to recent antihistamines use"}. Positive test to: ***. Negative test to: ***.  Results discussed with patient/family.   Medication List:  Current Outpatient Medications  Medication Sig Dispense Refill  . albuterol (VENTOLIN HFA) 108 (90 Base) MCG/ACT inhaler     . atorvastatin (LIPITOR) 80 MG tablet Take 80 mg by mouth daily.    . DUPIXENT 300 MG/2ML prefilled syringe     . EPINEPHrine 0.3 mg/0.3 mL IJ SOAJ injection     . fentaNYL  (DURAGESIC) 50 MCG/HR 1 patch every 3 (three) days.    . fluticasone (FLONASE) 50 MCG/ACT nasal spray Place 1 spray into both nostrils 2 (two) times daily.    Marland Kitchen gabapentin (NEURONTIN) 600 MG tablet     . JARDIANCE 25 MG TABS tablet Take 25 mg by mouth daily.    . methylPREDNISolone (MEDROL DOSEPAK) 4 MG TBPK tablet     . montelukast (SINGULAIR) 10 MG tablet Take 10 mg by mouth daily.    . nortriptyline (PAMELOR) 25 MG capsule Take 25 mg by mouth at bedtime.    . ondansetron (ZOFRAN) 8 MG tablet     . ONETOUCH ULTRA test strip 1 each daily.    . Oxycodone HCl 10 MG TABS Take 10 mg by mouth 5 (five) times daily as needed.    . pantoprazole (PROTONIX) 40 MG tablet Take 40 mg by mouth daily.    Harrel Carina ELLIPTA 200-62.5-25 MCG/INH AEPB Inhale 1 puff into the lungs daily. 28 each 0  . valsartan (DIOVAN) 160 MG tablet Take 160 mg by mouth daily.     No current facility-administered medications for this visit.   Allergies: No Known Allergies I reviewed his past medical history, social history, family history, and environmental history and no significant changes have been reported from his previous visit.  Review of Systems  Constitutional: Negative for appetite change, chills, fever and unexpected weight change.  HENT: Positive for congestion, postnasal drip, rhinorrhea and sneezing.   Eyes: Positive for itching.  Respiratory: Positive for cough, chest tightness, shortness of breath and wheezing.   Cardiovascular: Negative for  chest pain.  Gastrointestinal: Negative for abdominal pain.  Genitourinary: Negative for difficulty urinating.  Skin: Negative for rash.  Allergic/Immunologic: Positive for environmental allergies. Negative for food allergies.  Neurological: Positive for headaches.   Objective: There were no vitals taken for this visit. There is no height or weight on file to calculate BMI. Physical Exam Vitals and nursing note reviewed.  Constitutional:      Appearance: Normal  appearance. He is well-developed.  HENT:     Head: Normocephalic and atraumatic.     Right Ear: Tympanic membrane and external ear normal.     Left Ear: Tympanic membrane and external ear normal.     Nose: Nose normal. No congestion or rhinorrhea.     Mouth/Throat:     Mouth: Mucous membranes are moist.     Pharynx: Oropharynx is clear.  Eyes:     Conjunctiva/sclera: Conjunctivae normal.  Cardiovascular:     Rate and Rhythm: Normal rate and regular rhythm.     Heart sounds: Normal heart sounds. No murmur heard.  No friction rub. No gallop.   Pulmonary:     Effort: Pulmonary effort is normal.     Breath sounds: Rhonchi present. No wheezing or rales.  Abdominal:     Palpations: Abdomen is soft.  Musculoskeletal:     Cervical back: Neck supple.  Skin:    General: Skin is warm.     Findings: No rash.  Neurological:     Mental Status: He is alert and oriented to person, place, and time.  Psychiatric:        Behavior: Behavior normal.    Previous notes and tests were reviewed. The plan was reviewed with the patient/family, and all questions/concerned were addressed.  It was my pleasure to see Bryan Lopez today and participate in his care. Please feel free to contact me with any questions or concerns.  Sincerely,  Wyline Mood, DO Allergy & Immunology  [facility] office: [phone] [facility] office: [phone] [place] office: [phone]

## 2019-11-26 ENCOUNTER — Emergency Department (HOSPITAL_COMMUNITY): Payer: BC Managed Care – PPO

## 2019-11-26 ENCOUNTER — Ambulatory Visit: Payer: BC Managed Care – PPO | Admitting: Allergy

## 2019-11-26 ENCOUNTER — Encounter (HOSPITAL_COMMUNITY): Payer: Self-pay | Admitting: Emergency Medicine

## 2019-11-26 ENCOUNTER — Other Ambulatory Visit: Payer: Self-pay

## 2019-11-26 ENCOUNTER — Ambulatory Visit: Payer: BC Managed Care – PPO

## 2019-11-26 ENCOUNTER — Observation Stay (HOSPITAL_COMMUNITY)
Admission: EM | Admit: 2019-11-26 | Discharge: 2019-12-02 | Disposition: A | Discharge status: Home / Self Care | Payer: BC Managed Care – PPO | Attending: Family Medicine | Admitting: Family Medicine

## 2019-11-26 DIAGNOSIS — K5900 Constipation, unspecified: Secondary | ICD-10-CM | POA: Insufficient documentation

## 2019-11-26 DIAGNOSIS — E119 Type 2 diabetes mellitus without complications: Secondary | ICD-10-CM | POA: Diagnosis not present

## 2019-11-26 DIAGNOSIS — Z79899 Other long term (current) drug therapy: Secondary | ICD-10-CM

## 2019-11-26 DIAGNOSIS — M545 Low back pain, unspecified: Secondary | ICD-10-CM

## 2019-11-26 DIAGNOSIS — R509 Fever, unspecified: Secondary | ICD-10-CM | POA: Diagnosis not present

## 2019-11-26 DIAGNOSIS — Z825 Family history of asthma and other chronic lower respiratory diseases: Secondary | ICD-10-CM

## 2019-11-26 DIAGNOSIS — I2510 Atherosclerotic heart disease of native coronary artery without angina pectoris: Secondary | ICD-10-CM | POA: Diagnosis not present

## 2019-11-26 DIAGNOSIS — J309 Allergic rhinitis, unspecified: Secondary | ICD-10-CM | POA: Diagnosis not present

## 2019-11-26 DIAGNOSIS — J3089 Other allergic rhinitis: Secondary | ICD-10-CM | POA: Diagnosis present

## 2019-11-26 DIAGNOSIS — Z20822 Contact with and (suspected) exposure to covid-19: Secondary | ICD-10-CM | POA: Insufficient documentation

## 2019-11-26 DIAGNOSIS — E872 Acidosis: Secondary | ICD-10-CM | POA: Insufficient documentation

## 2019-11-26 DIAGNOSIS — G44201 Tension-type headache, unspecified, intractable: Secondary | ICD-10-CM

## 2019-11-26 DIAGNOSIS — B349 Viral infection, unspecified: Secondary | ICD-10-CM

## 2019-11-26 DIAGNOSIS — R1110 Vomiting, unspecified: Secondary | ICD-10-CM

## 2019-11-26 DIAGNOSIS — J0100 Acute maxillary sinusitis, unspecified: Secondary | ICD-10-CM | POA: Diagnosis not present

## 2019-11-26 DIAGNOSIS — R200 Anesthesia of skin: Secondary | ICD-10-CM | POA: Diagnosis not present

## 2019-11-26 DIAGNOSIS — M549 Dorsalgia, unspecified: Secondary | ICD-10-CM | POA: Diagnosis present

## 2019-11-26 DIAGNOSIS — E1140 Type 2 diabetes mellitus with diabetic neuropathy, unspecified: Secondary | ICD-10-CM | POA: Insufficient documentation

## 2019-11-26 DIAGNOSIS — R112 Nausea with vomiting, unspecified: Secondary | ICD-10-CM | POA: Diagnosis not present

## 2019-11-26 DIAGNOSIS — R519 Headache, unspecified: Secondary | ICD-10-CM

## 2019-11-26 DIAGNOSIS — E876 Hypokalemia: Secondary | ICD-10-CM | POA: Insufficient documentation

## 2019-11-26 DIAGNOSIS — Z8709 Personal history of other diseases of the respiratory system: Secondary | ICD-10-CM

## 2019-11-26 DIAGNOSIS — J0191 Acute recurrent sinusitis, unspecified: Secondary | ICD-10-CM

## 2019-11-26 DIAGNOSIS — E871 Hypo-osmolality and hyponatremia: Secondary | ICD-10-CM | POA: Diagnosis present

## 2019-11-26 DIAGNOSIS — Z794 Long term (current) use of insulin: Secondary | ICD-10-CM | POA: Diagnosis not present

## 2019-11-26 DIAGNOSIS — R6510 Systemic inflammatory response syndrome (SIRS) of non-infectious origin without acute organ dysfunction: Secondary | ICD-10-CM | POA: Diagnosis present

## 2019-11-26 DIAGNOSIS — M5442 Lumbago with sciatica, left side: Secondary | ICD-10-CM

## 2019-11-26 HISTORY — DX: Chronic sinusitis, unspecified: J32.9

## 2019-11-26 HISTORY — DX: Essential (primary) hypertension: I10

## 2019-11-26 HISTORY — DX: Personal history of other diseases of the respiratory system: Z87.09

## 2019-11-26 HISTORY — DX: Crohn's disease, unspecified, without complications: K50.90

## 2019-11-26 HISTORY — DX: Gastro-esophageal reflux disease without esophagitis: K21.9

## 2019-11-26 HISTORY — DX: Hyperlipidemia, unspecified: E78.5

## 2019-11-26 HISTORY — DX: Personal history of other diseases of the nervous system and sense organs: Z86.69

## 2019-11-26 IMAGING — CR DG LUMBAR SPINE COMPLETE 4+V
5 series · 5 of 5 positions shown · non-contrast
Comparison: None.

CLINICAL DATA: Back pain after lifting a heavy object today.

EXAM:
LUMBAR SPINE - COMPLETE 4+ VIEW

[t lumbar spine ap]
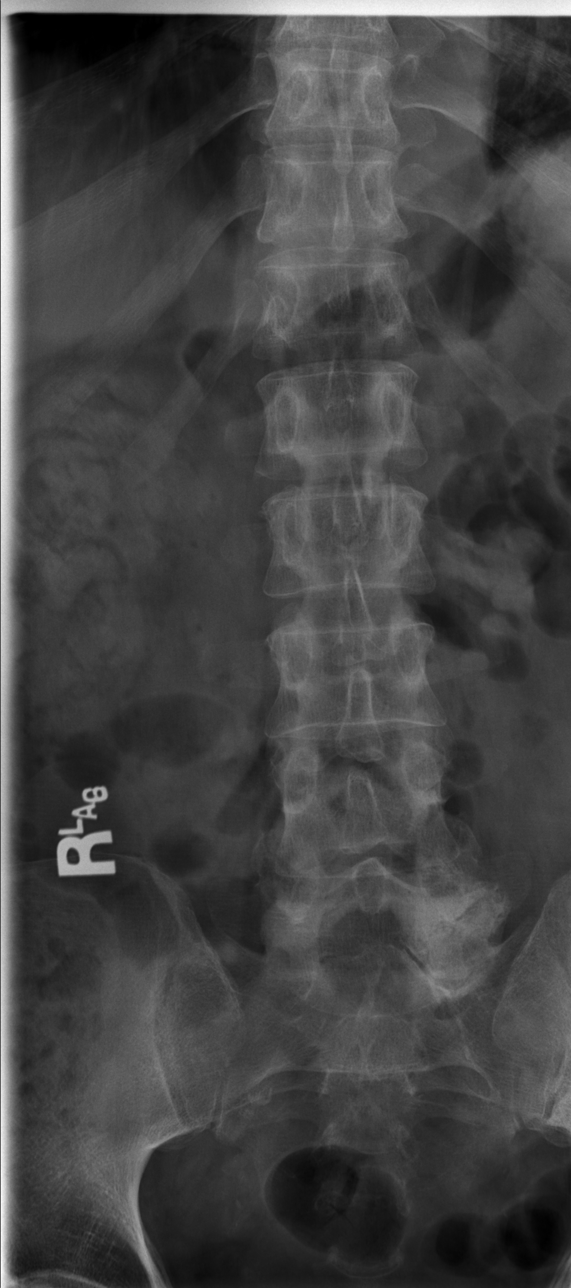

[t lumbar spine obl (1 of 2)]
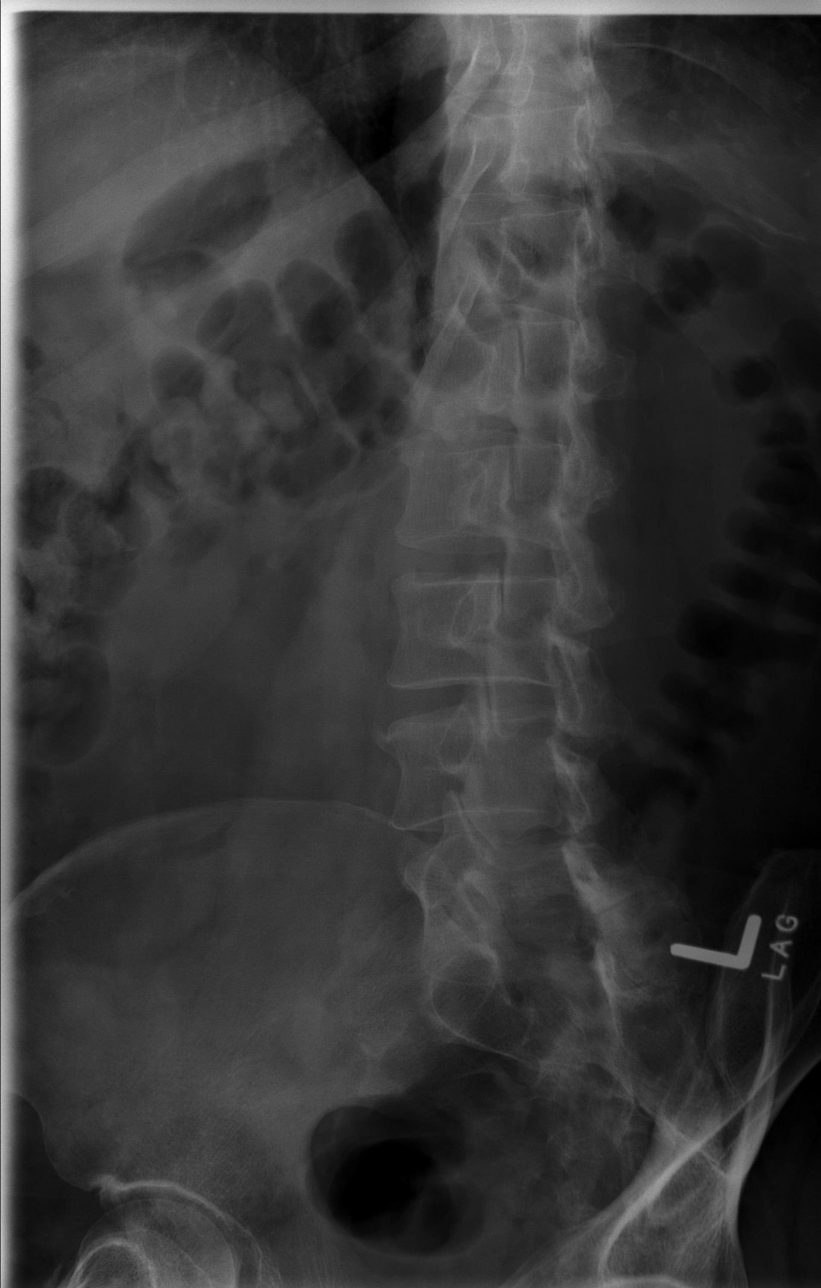

[t lumbar spine obl (2 of 2)]
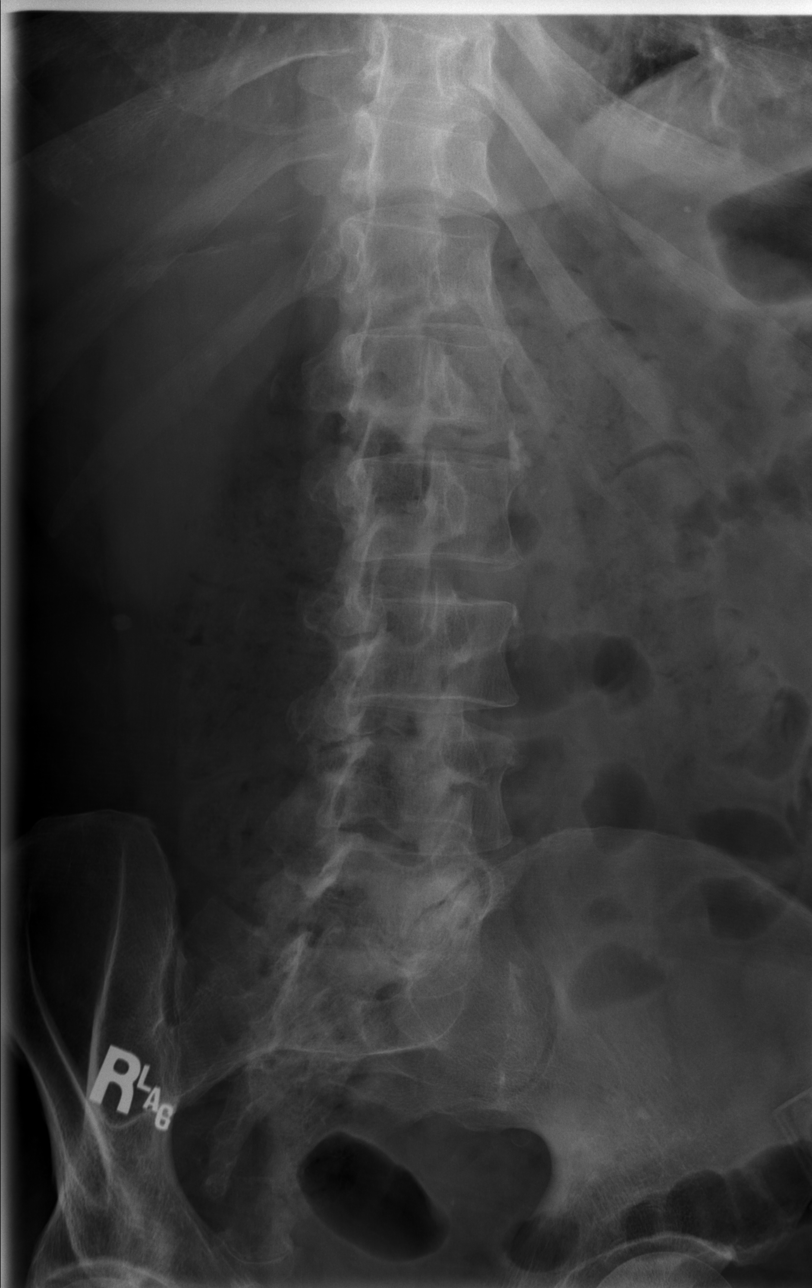

[t lumbar spine lat]
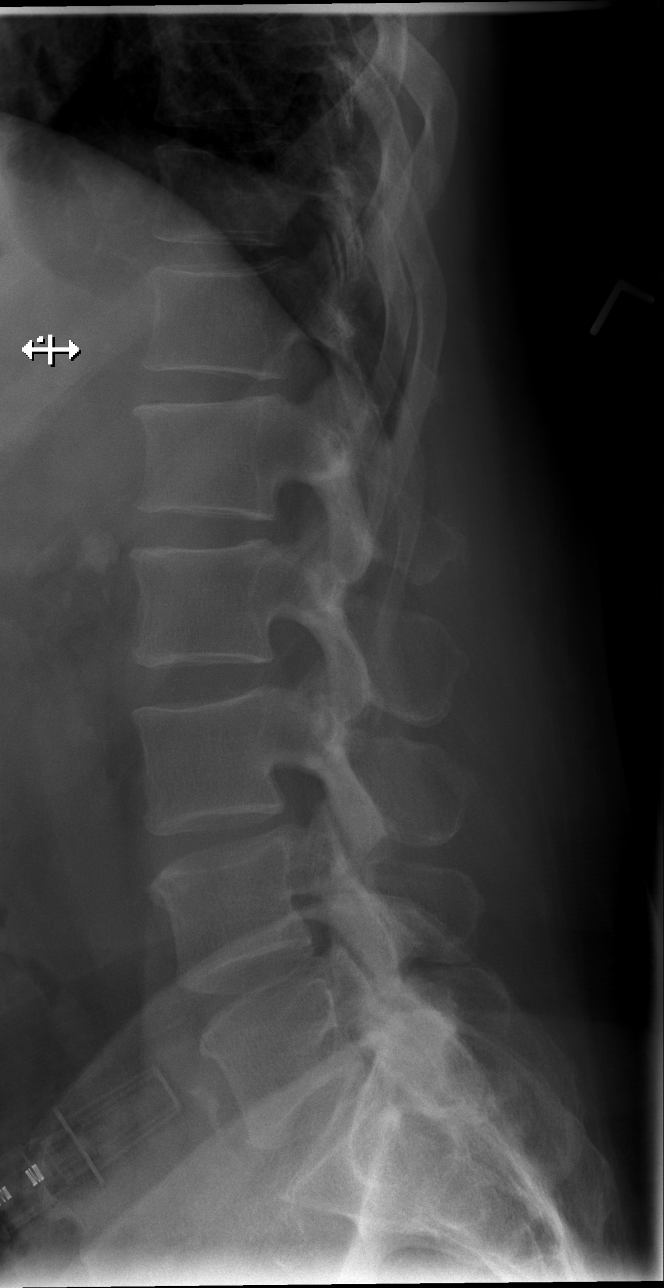

[t lumbar l-5 s-1 spot]
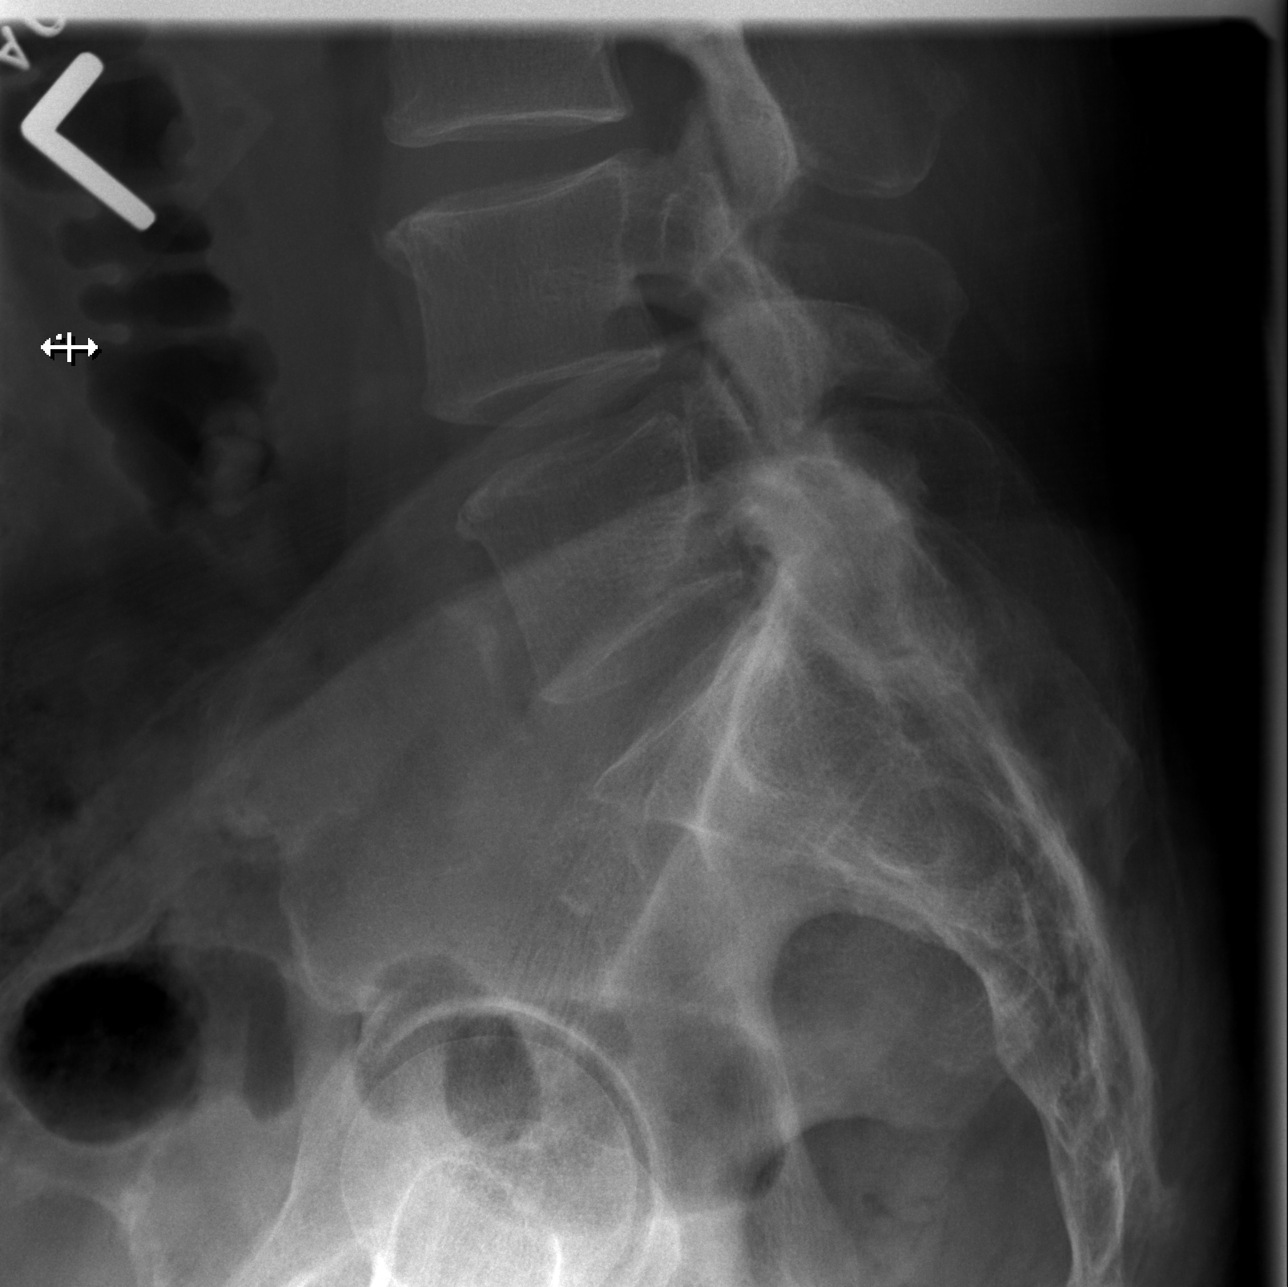

[5 of 5 positions shown; findings below may reference images not displayed]

FINDINGS: Five lumbar type vertebral bodies. Normal alignment. No vertebral
compression deformities. Mild degenerative changes with slight disc
space narrowing and endplate osteophyte formation. Sclerosis and
prominent degenerative changes in the posterior elements at L5-S1 on
the left. This may indicate spondylolysis. No spondylolisthesis.
Visualized sacrum appears intact.
IMPRESSION: 1. No acute displaced fractures identified.
2. Possible spondylolysis at L5-S1 on the left with asymmetric
prominence of degenerative changes in the posterior elements.

## 2019-11-26 IMAGING — CT CT CERVICAL SPINE W/O CM
3 of 4 series · 13 of 33 positions shown, 16 images · non-contrast
Comparison: None.

CLINICAL DATA: Status post trauma.

EXAM:
CT CERVICAL SPINE WITHOUT CONTRAST
TECHNIQUE: Multidetector CT imaging of the cervical spine was performed without
intravenous contrast. Multiplanar CT image reconstructions were also
generated.

[Series 7: orthogonal bone · axial · 0.32mm/px · z∈[-242,-123]mm · 5 of 98 slices shown, 7 images]
[im 17/98  soft-tissue]
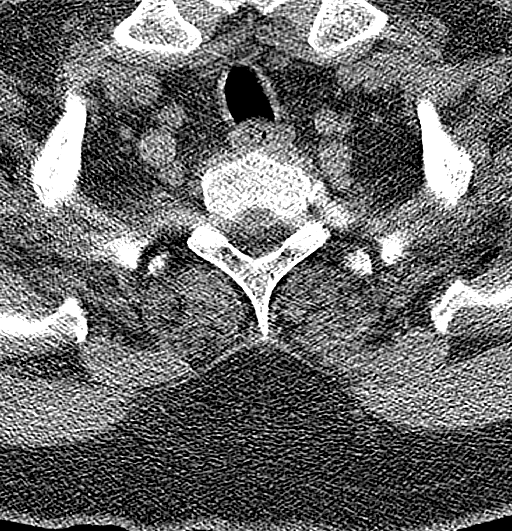
[im 17/98  bone]
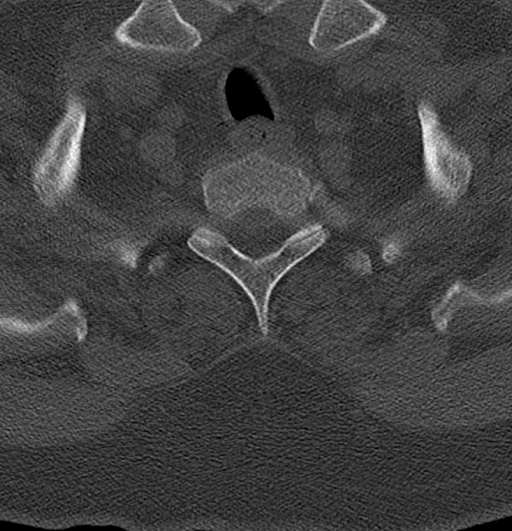
[im 33/98  bone]
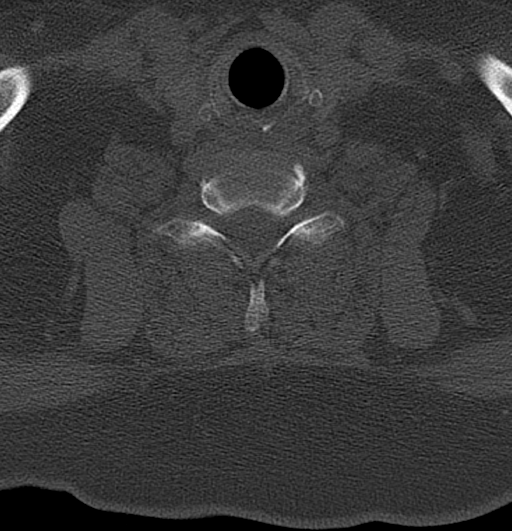
[im 49/98  bone]
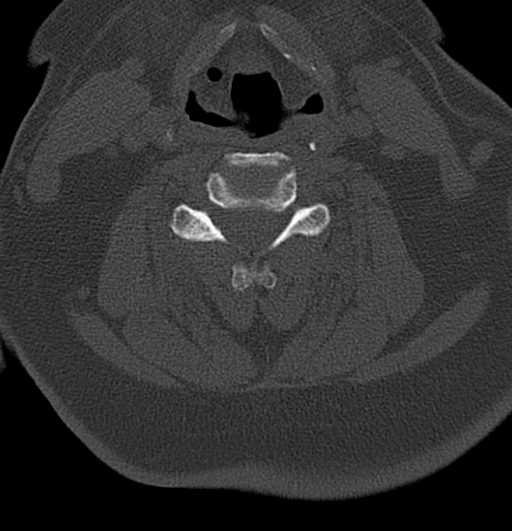
[im 65/98  bone]
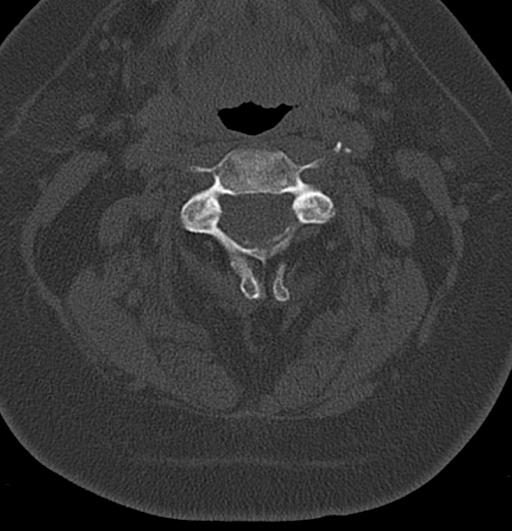
[im 81/98  soft-tissue]
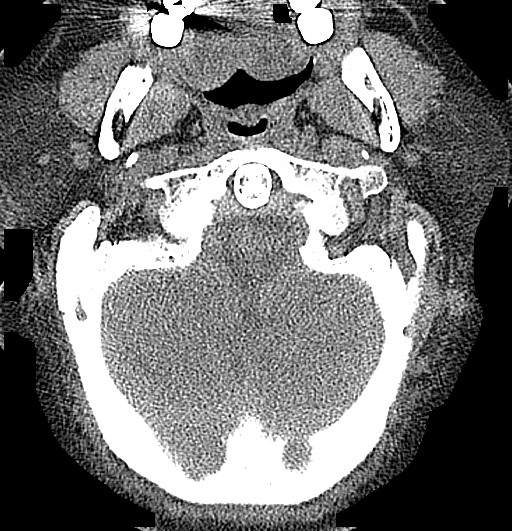
[im 81/98  bone]
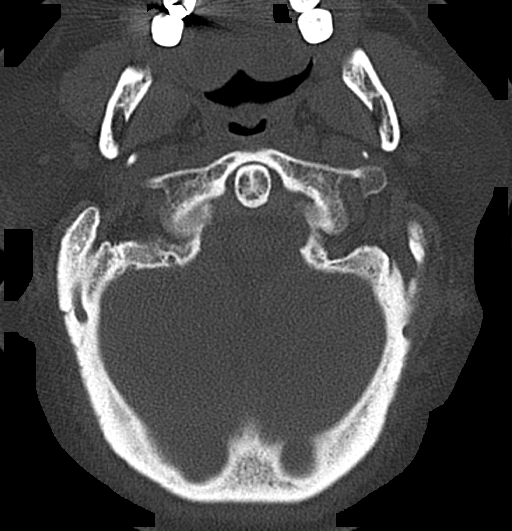

[Series 8: coronal bone · coronal · 0.27mm/px · 3 of 49 slices shown]
[im 10/49  bone]
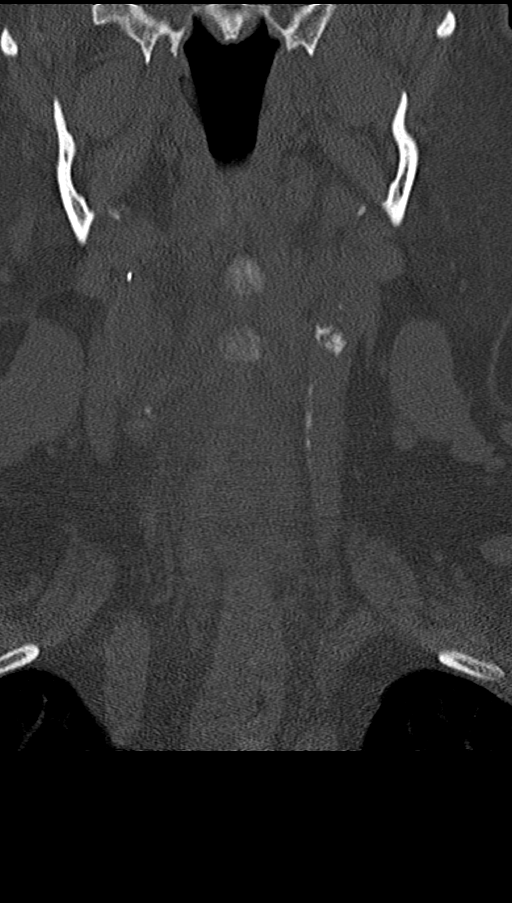
[im 20/49  bone]
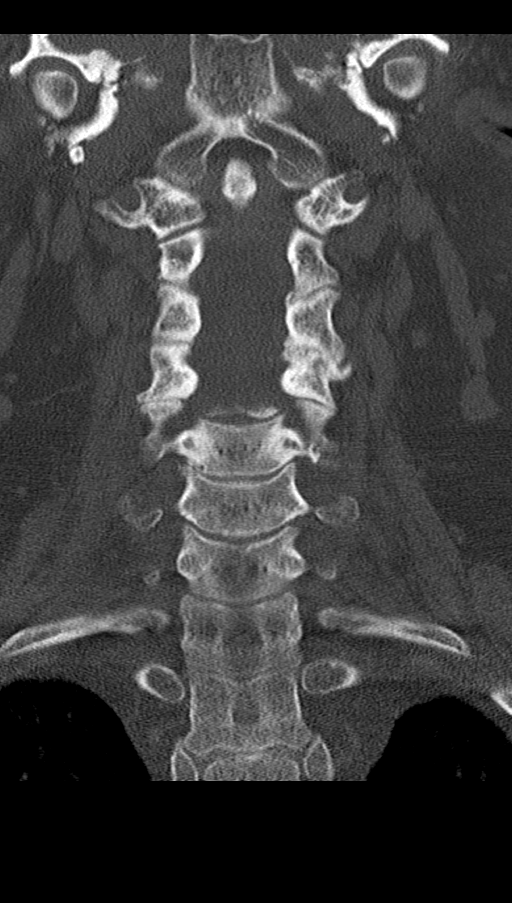
[im 29/49  bone]
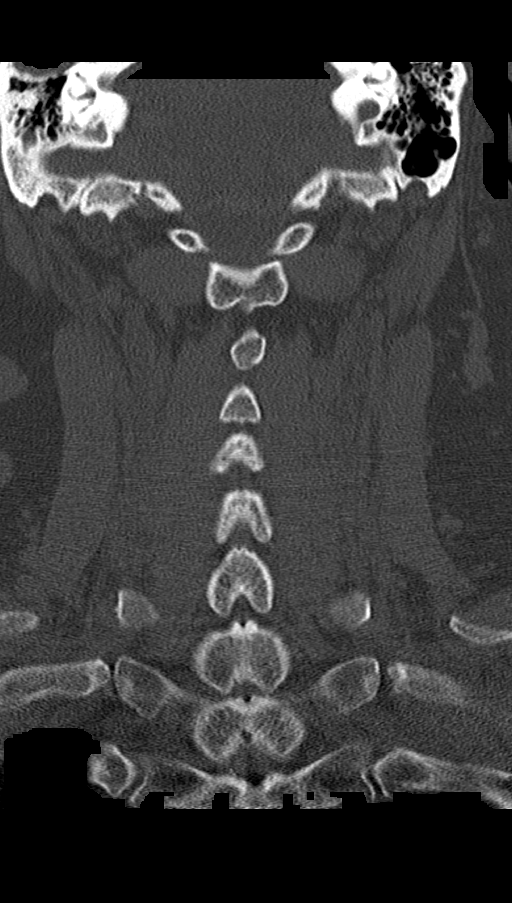

[Series 9: sagittal bone · sagittal · 0.36mm/px · 5 of 50 slices shown, 6 images]
[im 17/50  bone]
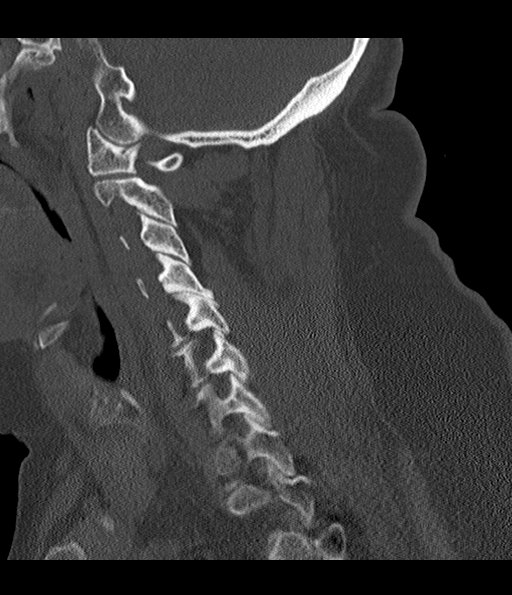
[im 21/50  bone]
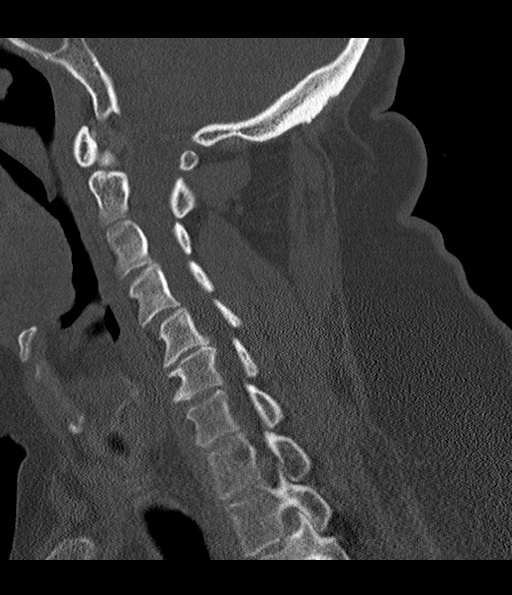
[im 25/50  soft-tissue]
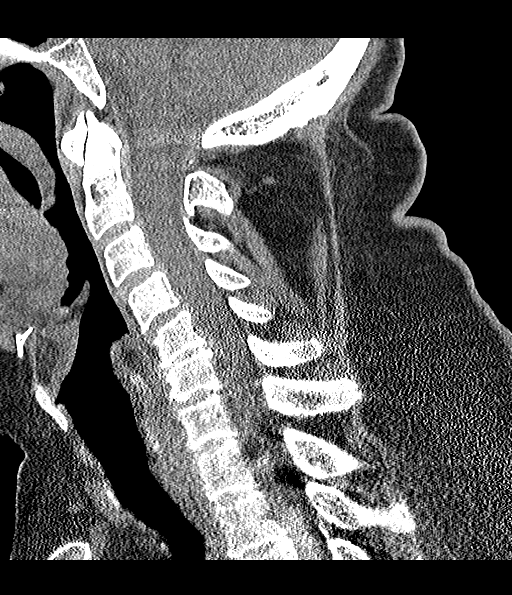
[im 25/50  bone]
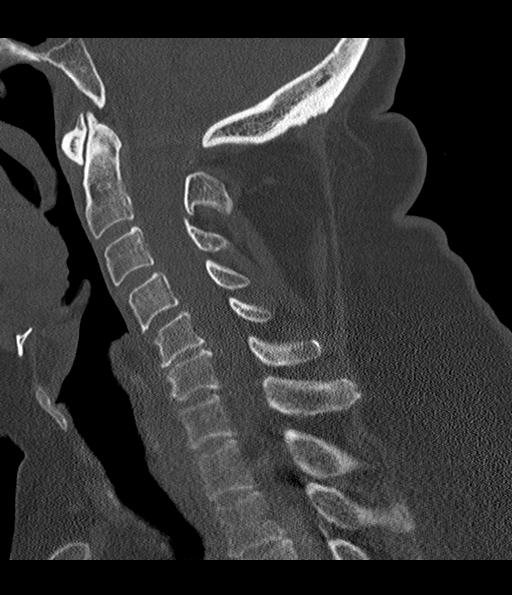
[im 29/50  bone]
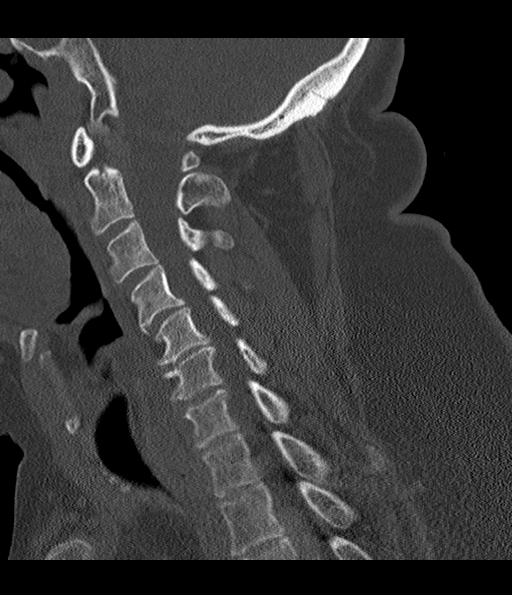
[im 33/50  bone]
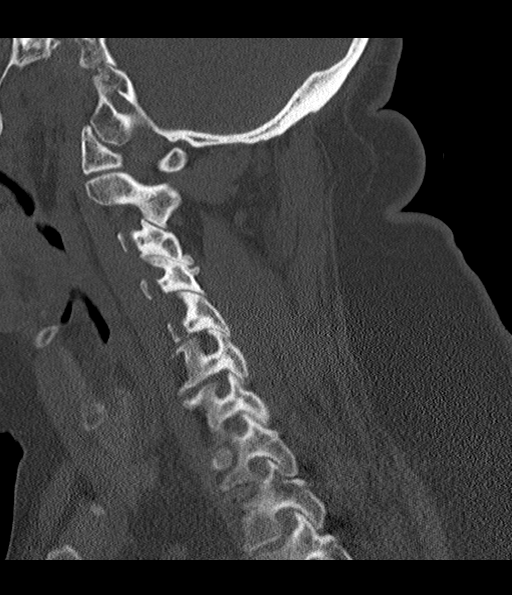

[13 of 33 positions shown; findings below may reference images not displayed]

FINDINGS: Alignment: Normal.

Skull base and vertebrae: No acute fracture. No primary bone lesion
or focal pathologic process.

Soft tissues and spinal canal: No prevertebral fluid or swelling. No
visible canal hematoma.

Disc levels: Mild to moderate severity endplate sclerosis is seen at
the levels of C5-C6 and C6-C7. Mild intervertebral disc space
narrowing is also seen at these levels.

Mild to moderate severity bilateral multilevel facet joint
hypertrophy is noted.

Upper chest: A 6 mm bone island is seen within the paraspinal region
of the third left rib.

Other: There is moderate severity sphenoid sinus mucosal thickening.
IMPRESSION: 1. No acute fracture or subluxation of the cervical spine.
2. Mild to moderate severity degenerative changes at the levels of
C5-C6 and C6-C7.
3. Moderate severity sphenoid sinus mucosal thickening.

## 2019-11-26 IMAGING — CR DG THORACIC SPINE 2V
3 series · 3 of 3 positions shown · non-contrast
Comparison: None.

CLINICAL DATA: Back pain after lifting heavy object today.

EXAM:
THORACIC SPINE 2 VIEWS

[t thoracic spine ap]
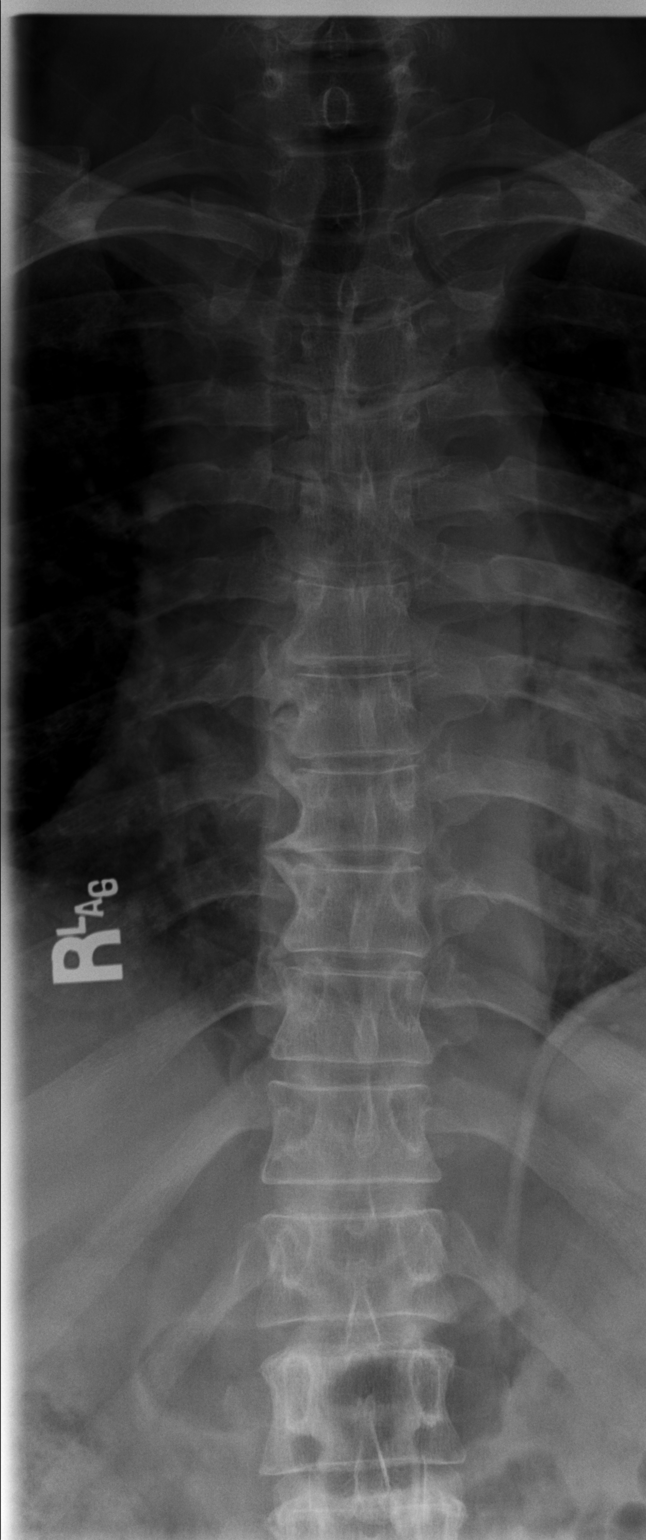

[t thoracic breathing lat]
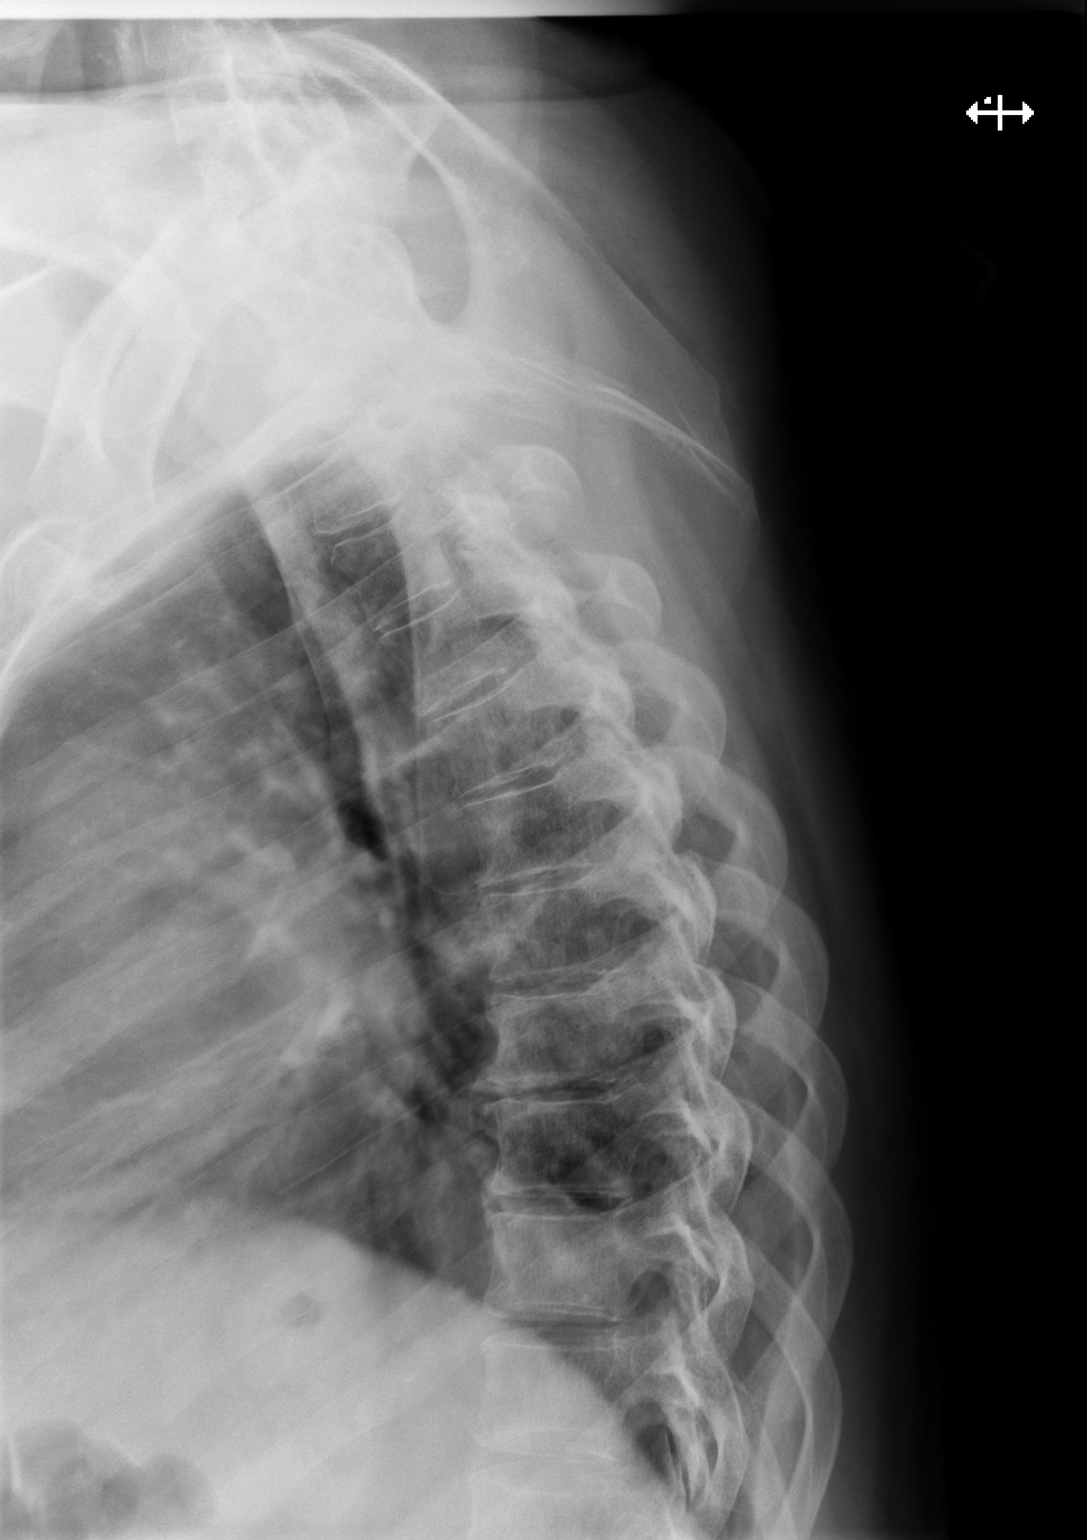

[t thoracic swimmers]
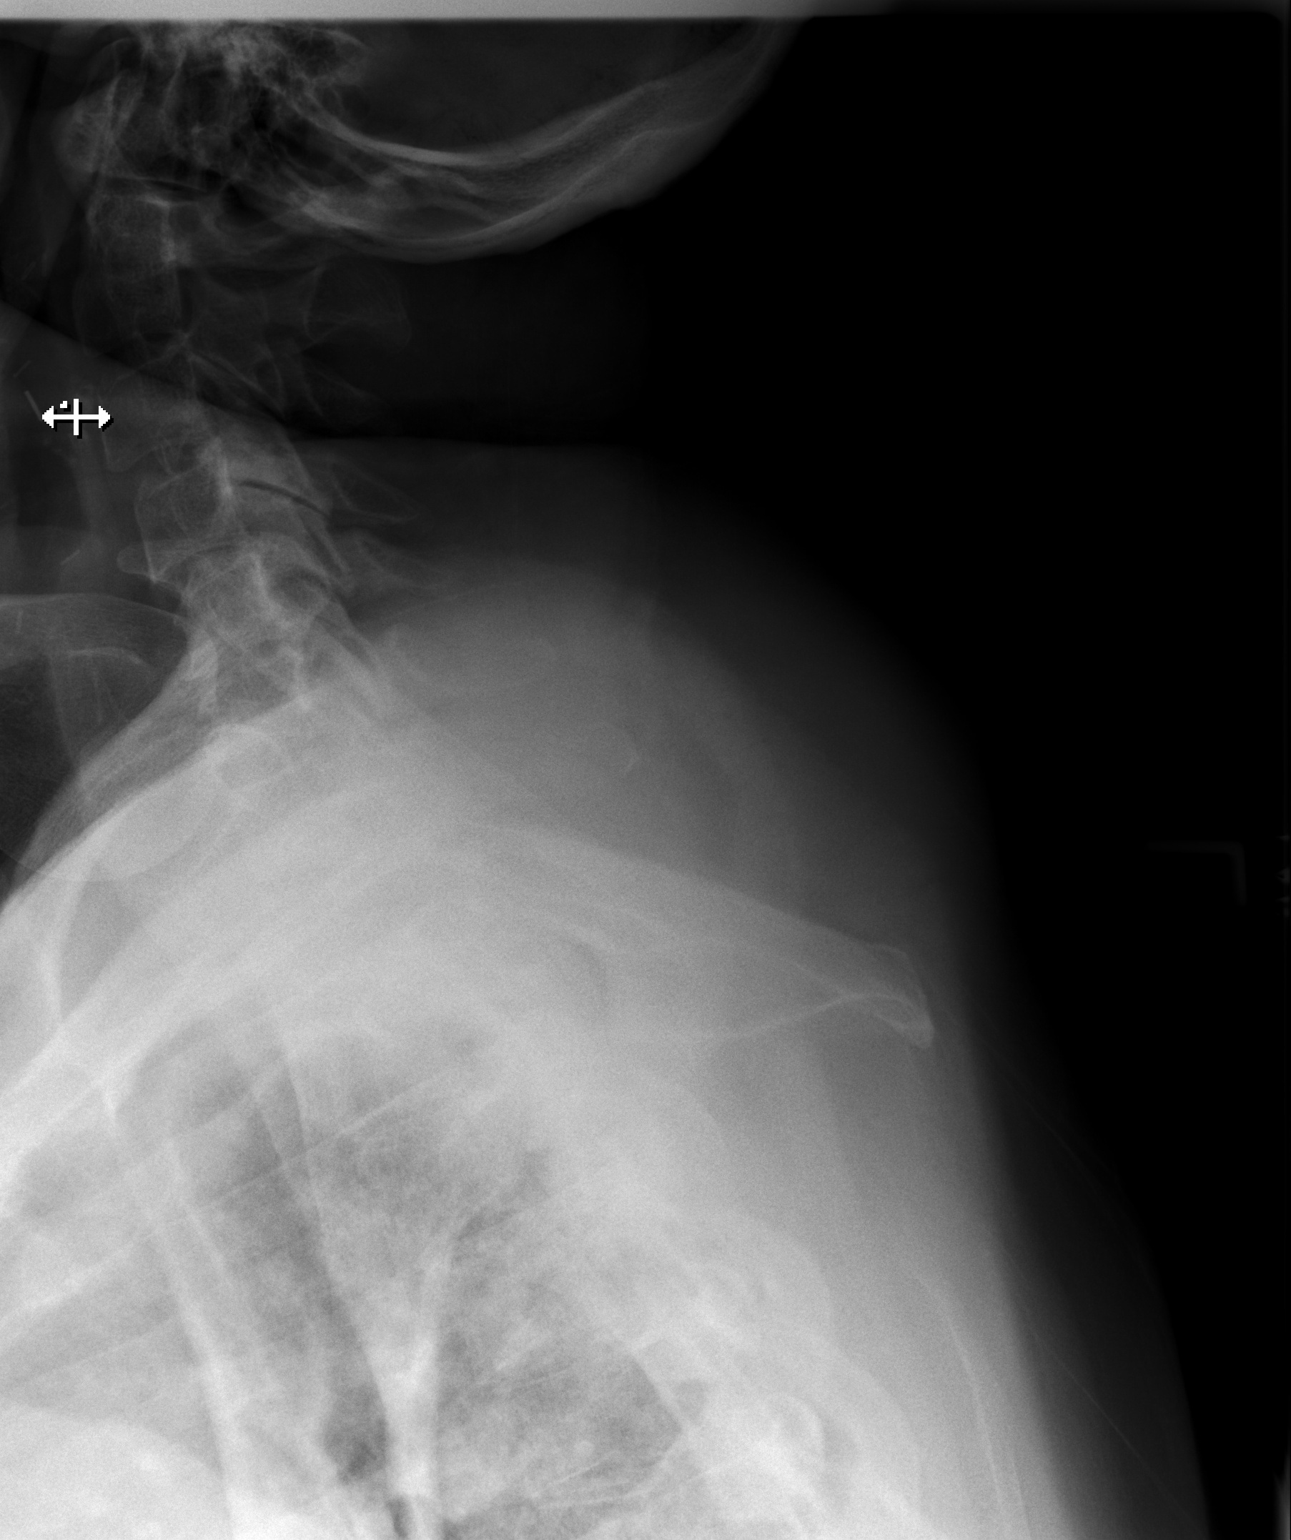

[3 of 3 positions shown; findings below may reference images not displayed]

FINDINGS: Normal alignment of the thoracic spine. No vertebral compression
deformities. Degenerative changes with disc space narrowing and
endplate hypertrophic changes mostly in the midthoracic region. Bone
cortex appears intact. No paraspinal soft tissue swelling.
IMPRESSION: Degenerative changes in the thoracic spine. No acute displaced
fractures identified.

## 2019-11-26 IMAGING — CR DG CHEST 2V
2 series · 2 of 2 positions shown · non-contrast
Comparison: None.

CLINICAL DATA: Cough.  Back pain after lifting heavy object today.

EXAM:
CHEST - 2 VIEW

[w chest pa]
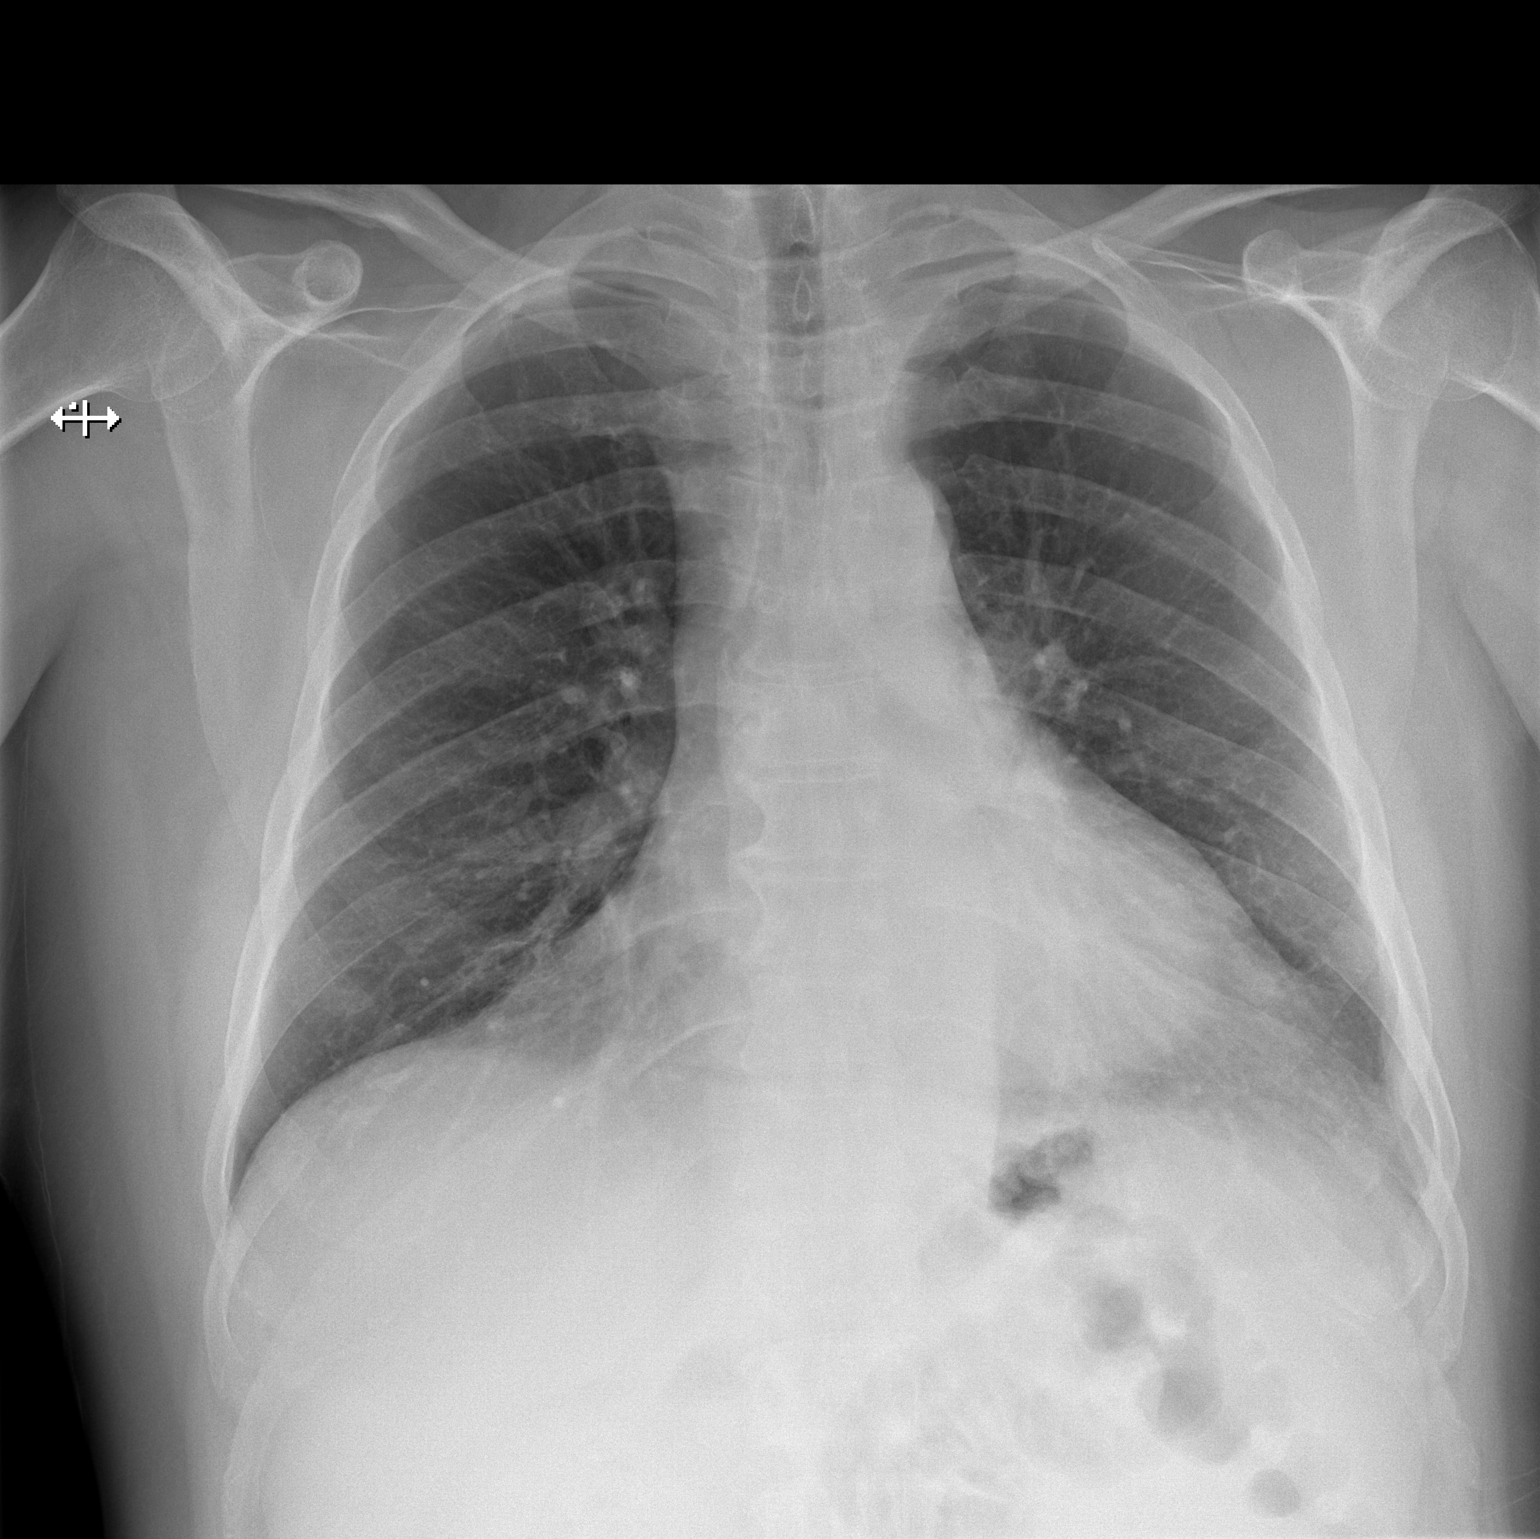

[w chest lat]
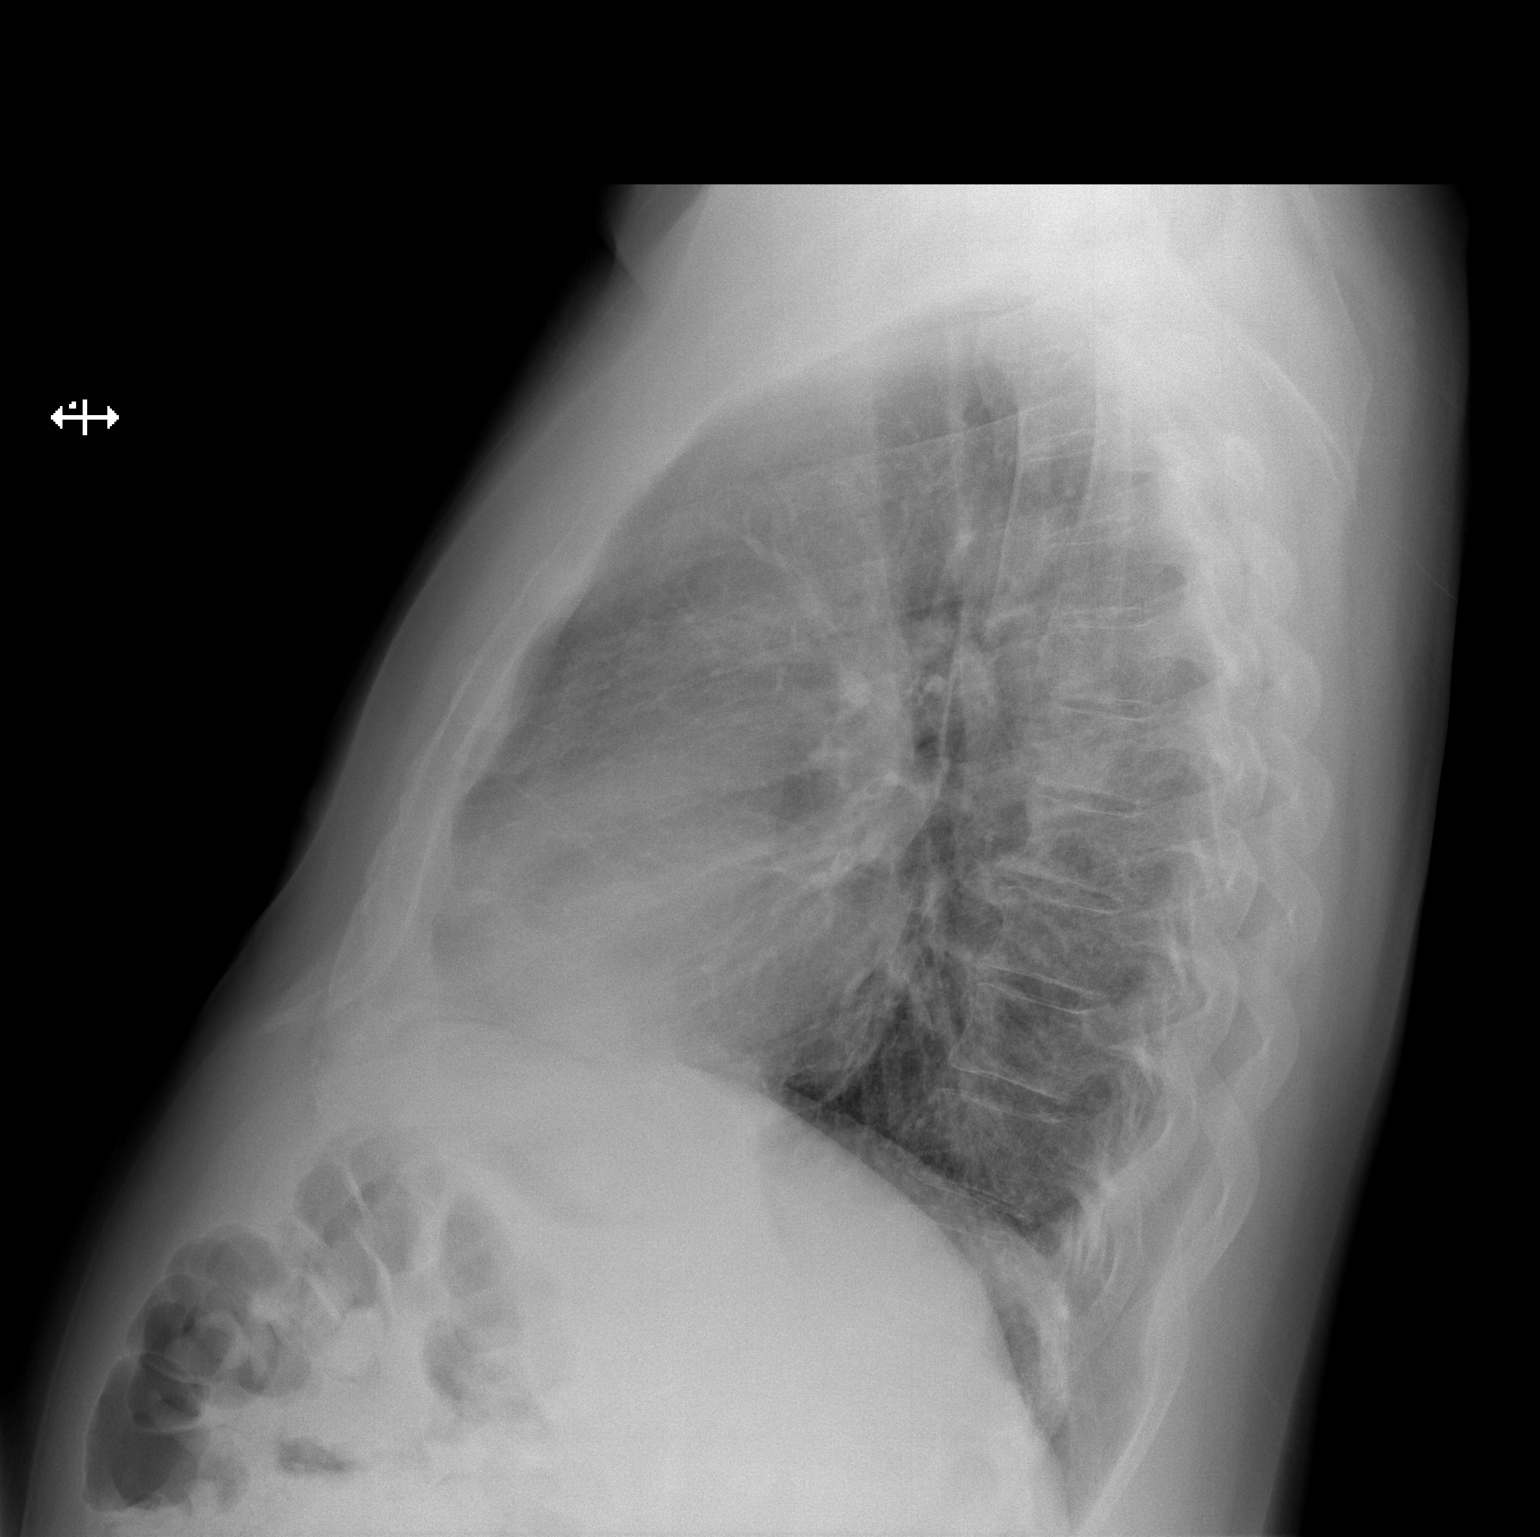

[2 of 2 positions shown; findings below may reference images not displayed]

FINDINGS: Heart size and pulmonary vascularity are normal. Lungs are clear. No
pleural effusions. No pneumothorax. Mediastinal contours appear
intact. Degenerative changes in the spine.
IMPRESSION: No active cardiopulmonary disease.

## 2019-11-26 MED ORDER — METHOCARBAMOL 500 MG PO TABS
500.0000 mg | ORAL_TABLET | Freq: Two times a day (BID) | ORAL | 0 refills | Status: DC
Start: 2019-11-26 — End: 2019-12-15

## 2019-11-26 MED ORDER — PREDNISONE 20 MG PO TABS
40.0000 mg | ORAL_TABLET | Freq: Every day | ORAL | 0 refills | Status: DC
Start: 2019-11-26 — End: 2019-12-02

## 2019-11-26 MED ORDER — ONDANSETRON HCL 4 MG/2ML IJ SOLN
4.0000 mg | Freq: Once | INTRAMUSCULAR | Status: AC
  Administered 2019-11-26: 4 mg via INTRAVENOUS
  Filled 2019-11-26: qty 2

## 2019-11-26 MED ORDER — MORPHINE SULFATE (PF) 4 MG/ML IV SOLN
4.0000 mg | Freq: Once | INTRAVENOUS | Status: AC
  Administered 2019-11-26: 4 mg via INTRAVENOUS
  Filled 2019-11-26: qty 1

## 2019-11-26 NOTE — ED Notes (Signed)
Patient transported to CT 

## 2019-11-26 NOTE — Discharge Instructions (Signed)
Take Robaxin as prescribed. This medication will make you drowsy so do not drive or drink alcohol when taking it.  You can take Tylenol or Ibuprofen as directed for pain. You can alternate Tylenol and Ibuprofen every 4 hours. If you take Tylenol at 1pm, then you can take Ibuprofen at 5pm. Then you can take Tylenol again at 9pm.   Take prednisone as directed.  Return to the Emergency Department immediately for any worsening back pain, neck pain, difficulty walking, numbness/weaknss of your arms or legs, urinary or bowel accidents, fever or any other worsening or concerning symptoms.

## 2019-11-26 NOTE — ED Triage Notes (Signed)
Patient c/o pain in neck radiating down back today. Reports moving and lifted heavy box and twisted to put box down and pain traveled down spine. Reports initial tingling in bilateral hands that has resolved since that time. Denies changes in bowel and bladder.

## 2019-11-26 NOTE — ED Provider Notes (Signed)
[facility]-EMERGENCY DEPT Provider Note   CSN: [id number] Arrival date & time: 11/26/19  2015     History Chief Complaint  Patient presents with  . Back Pain    Bryan Lopez is a 59 y.o. male past medical history of asthma, frequent respiratory infections who presents for evaluation of multiple complaints.  He reports that he has had ongoing chronic neck and back pain for several months.  He states that he attributed this because he just moved here and was sleeping on an air mattress.  He states that he chronically has back issues that flareup intermittently.  He states that today, he was moving into a new place and states that he picked up a heavy wardrobe and when he did, he started having worsening pain in his neck and back.  He states that the neck and back pain hurts more when he tries to move.  He states that he had some tingling in his bilateral fingertips after he initially had the pain.  He states this is now since resolved.  He does report that he has had that previously with his back issues.  The low back pain in the lower aspect radiates down into his gluteus and into his left lower extremity.  He states he still has been able to walk but reports worsening pain when he tries to move or bend.  He also reports that yesterday, he starting feeling feverish and having chills.  He states he has not measured any temperature.  He states that he had some nausea/vomiting earlier today.  He states that this was mostly dry heaving but was also spitting up.  No blood noted.  He states he has not been able to eat or drink anything today.  He states his stomach feels empty but does not hurt.  He states that he has felt tired and fatigued.  He states he has chronic cough from secondary MRSA respiratory infection.  He states he chronically has hematuria.  He states that he has not peed much today.  He denies any chest pain, difficulty breathing.  He states that he did not get Covid  vaccinated. He does report that he recently traveled to [place] and came back a few days. Denies weight loss, numbness/weakness of upper and lower extremities, bowel/bladder incontinence, saddle anesthesia, history of back surgery, history of IVDA.    The history is provided by the patient.       Past Medical History:  Diagnosis Date  . Urticaria     Patient Active Problem List   Diagnosis Date Noted  . SIRS (systemic inflammatory response syndrome) (HCC) 11/27/2019  . Acute back pain 11/27/2019  . Fever 11/27/2019  . DM (diabetes mellitus), type 2 (HCC) 11/27/2019  . History of frequent upper respiratory infection 10/07/2019  . Other allergic rhinitis 10/07/2019  . Urticaria 10/07/2019  . Not well controlled severe persistent asthma 10/06/2019    History reviewed. No pertinent surgical history.     Family History  Problem Relation Age of Onset  . Asthma Father   . Allergic rhinitis Neg Hx   . Atopy Neg Hx     Social History   Tobacco Use  . Smoking status: Never Smoker  . Smokeless tobacco: Never Used  Substance Use Topics  . Alcohol use: Never  . Drug use: Never    Home Medications Prior to Admission medications   Medication Sig Start Date End Date Taking? Authorizing Provider  albuterol (VENTOLIN HFA) 108 (90 Base)  MCG/ACT inhaler Inhale 1-2 puffs into the lungs every 4 (four) hours as needed for wheezing.  04/11/19  Yes [provider]  atorvastatin (LIPITOR) 80 MG tablet Take 80 mg by mouth daily. 09/26/19  Yes [provider]  DUPIXENT 300 MG/2ML prefilled syringe Inject 300 mg into the skin every 14 (fourteen) days.  07/30/19  Yes [provider]  EPINEPHrine 0.3 mg/0.3 mL IJ SOAJ injection Inject 0.3 mg into the muscle as needed for anaphylaxis.  08/13/19  Yes [provider]  fentaNYL (DURAGESIC) 50 MCG/HR Place 1 patch onto the skin every three (3) days as needed (pain).  09/29/19  Yes [provider]  gabapentin  (NEURONTIN) 600 MG tablet Take 1,200 mg by mouth 2 (two) times daily.  09/29/19  Yes [provider]  Insulin Glargine-Lixisenatide (SOLIQUA [place]) Inject 40-60 Units into the skin daily. Depending on CBG's   Yes [provider]  JARDIANCE 25 MG TABS tablet Take 25 mg by mouth daily. 07/20/19  Yes [provider]  montelukast (SINGULAIR) 10 MG tablet Take 10 mg by mouth daily. 06/10/19  Yes [provider]  nortriptyline (PAMELOR) 25 MG capsule Take 25 mg by mouth at bedtime. 09/29/19  Yes [provider]  Oxycodone HCl 10 MG TABS Take 10 mg by mouth 5 (five) times daily as needed (pain).  09/29/19  Yes [provider]  pantoprazole (PROTONIX) 40 MG tablet Take 40 mg by mouth daily. 08/04/19  Yes [provider]  Dwyane Luo 200-62.5-25 MCG/INH AEPB Inhale 1 puff into the lungs daily. 11/10/19  Yes Ellamae Sia, DO  valsartan (DIOVAN) 160 MG tablet Take 160 mg by mouth daily. 08/04/19  Yes [provider]  fluticasone (FLONASE) 50 MCG/ACT nasal spray Place 1 spray into both nostrils 2 (two) times daily. 09/28/19   [provider]  methocarbamol (ROBAXIN) 500 MG tablet Take 1 tablet (500 mg total) by mouth 2 (two) times daily. 11/26/19   Maxwell Caul, PA-C  ONETOUCH ULTRA test strip 1 each daily. 04/14/19   [provider]  predniSONE (DELTASONE) 20 MG tablet Take 2 tablets (40 mg total) by mouth daily for 4 days. 11/26/19 11/30/19  Maxwell Caul, PA-C    Allergies    Patient has no known allergies.  Review of Systems   Review of Systems  Constitutional: Positive for appetite change, chills and fever.  Respiratory: Negative for cough and shortness of breath.   Cardiovascular: Negative for chest pain.  Gastrointestinal: Positive for nausea and vomiting. Negative for abdominal pain and diarrhea.  Genitourinary: Negative for dysuria and hematuria.  Musculoskeletal: Positive for back pain and neck pain.    Neurological: Negative for weakness, numbness and headaches.  All other systems reviewed and are negative.   Physical Exam Updated Vital Signs BP 115/70   Pulse 95   Temp (!) 97.5 F (36.4 C) (Oral)   Resp 14   Ht  (1.727 m)   Wt 86.2 kg   SpO2 96%   BMI 28.89 kg/m   Physical Exam Vitals and nursing note reviewed.  Constitutional:      Appearance: Normal appearance. He is well-developed.  HENT:     Head: Normocephalic and atraumatic.  Eyes:     General: Lids are normal.     Conjunctiva/sclera: Conjunctivae normal.     Pupils: Pupils are equal, round, and reactive to light.  Neck:     Comments: Neck is supple and without rigidity.  Tenderness palpation in  midline C-spine.  No deformity or crepitus noted. Cardiovascular:     Rate and Rhythm: Regular rhythm. Tachycardia present.     Pulses: Normal pulses.     Heart sounds: Normal heart sounds. No murmur heard.  No friction rub. No gallop.   Pulmonary:     Effort: Pulmonary effort is normal.     Breath sounds: Normal breath sounds.     Comments: Lungs clear to auscultation bilaterally.  Symmetric chest rise.  No wheezing, rales, rhonchi. Abdominal:     Palpations: Abdomen is soft. Abdomen is not rigid.     Tenderness: There is no abdominal tenderness. There is no guarding.     Comments: Abdomen is soft, non-distended, non-tender. No rigidity, No guarding. No peritoneal signs.  Musculoskeletal:        General: Normal range of motion.     Comments: Diffuse midline tenderness noted to T and L-spine.  Skin:    General: Skin is warm and dry.     Capillary Refill: Capillary refill takes less than 2 seconds.  Neurological:     Mental Status: He is alert and oriented to person, place, and time.     Comments: Cranial nerves III-XII intact Follows commands, Moves all extremities  5/5 strength to BUE and BLE  Sensation intact throughout all major nerve distributions No gait abnormalities   No slurred speech. No facial  droop.  Positive SLR on LLE.   Psychiatric:        Speech: Speech normal.     ED Results / Procedures / Treatments   Labs (all labs ordered are listed, but only abnormal results are displayed) Labs Reviewed  COMPREHENSIVE METABOLIC PANEL - Abnormal; Notable for the following components:      Result Value   Sodium 132 (*)    CO2 18 (*)    Total Protein 8.5 (*)    Total Bilirubin 2.1 (*)    Anion gap 16 (*)    All other components within normal limits  CBC WITH DIFFERENTIAL/PLATELET - Abnormal; Notable for the following components:   WBC 10.8 (*)    Neutro Abs 8.7 (*)    All other components within normal limits  URINALYSIS, ROUTINE W REFLEX MICROSCOPIC - Abnormal; Notable for the following components:   Glucose, UA >=500 (*)    Ketones, ur 80 (*)    All other components within normal limits  SEDIMENTATION RATE - Abnormal; Notable for the following components:   Sed Rate 95 (*)    All other components within normal limits  C-REACTIVE PROTEIN - Abnormal; Notable for the following components:   CRP 17.4 (*)    All other components within normal limits  SARS CORONAVIRUS 2 BY RT PCR (HOSPITAL ORDER, PERFORMED IN [facility])  URINE CULTURE  CULTURE, BLOOD (ROUTINE X 2)  CULTURE, BLOOD (ROUTINE X 2)  LIPASE, BLOOD  LACTIC ACID, PLASMA  LACTIC ACID, PLASMA  CBG MONITORING, ED  CBG MONITORING, ED    EKG None  Radiology DG Chest 2 View  Result Date: 11/26/2019 CLINICAL DATA:  Cough.  Back pain after lifting heavy object today. EXAM: CHEST - 2 VIEW COMPARISON:  None. FINDINGS: Heart size and pulmonary vascularity are normal. Lungs are clear. No pleural effusions. No pneumothorax. Mediastinal contours appear intact. Degenerative changes in the spine. IMPRESSION: No active cardiopulmonary disease. Electronically Signed   By: Burman Nieves M.D.   On: 11/26/2019 23:46   DG Thoracic Spine 2 View  Result Date: 11/26/2019 CLINICAL DATA:  Back pain after lifting heavy  object today. EXAM: THORACIC SPINE 2 VIEWS COMPARISON:  None. FINDINGS: Normal alignment of the thoracic spine. No vertebral compression deformities. Degenerative changes with disc space narrowing and endplate hypertrophic changes mostly in the midthoracic region. Bone cortex appears intact. No paraspinal soft tissue swelling. IMPRESSION: Degenerative changes in the thoracic spine. No acute displaced fractures identified. Electronically Signed   By: Burman NievesWilliam  Stevens M.D.   On: 11/26/2019 23:43   DG Lumbar Spine Complete  Result Date: 11/26/2019 CLINICAL DATA:  Back pain after lifting a heavy object today. EXAM: LUMBAR SPINE - COMPLETE 4+ VIEW COMPARISON:  None. FINDINGS: Five lumbar type vertebral bodies. Normal alignment. No vertebral compression deformities. Mild degenerative changes with slight disc space narrowing and endplate osteophyte formation. Sclerosis and prominent degenerative changes in the posterior elements at L5-S1 on the left. This may indicate spondylolysis. No spondylolisthesis. Visualized sacrum appears intact. IMPRESSION: 1. No acute displaced fractures identified. 2. Possible spondylolysis at L5-S1 on the left with asymmetric prominence of degenerative changes in the posterior elements. Electronically Signed   By: Burman NievesWilliam  Stevens M.D.   On: 11/26/2019 23:45   CT Head Wo Contrast  Result Date: 11/27/2019 CLINICAL DATA:  Worsening headache, immunodeficiency, fever, previous sinus and nasal surgery for MRSA infection. EXAM: CT HEAD WITHOUT CONTRAST TECHNIQUE: Contiguous axial images were obtained from the base of the skull through the vertex without intravenous contrast. COMPARISON:  None. FINDINGS: Brain: No evidence of acute infarction, hemorrhage, hydrocephalus, extra-axial collection or mass lesion/mass effect. Vascular: No hyperdense vessel or unexpected calcification. Skull: Normal. Negative for fracture or focal lesion. Sinuses/Orbits: Air-fluid level in the sphenoid sinus likely  sinusitis. Mucosal thickening in the paranasal sinuses otherwise. Postoperative changes with partial left turbinate tectum ease and resection of left ethmoid septations. Mastoid air cells are clear. Other: None. IMPRESSION: 1. No acute intracranial abnormalities. 2. Air-fluid level in the sphenoid sinus likely sinusitis. Electronically Signed   By: Burman NievesWilliam  Stevens M.D.   On: 11/27/2019 03:06   CT Cervical Spine Wo Contrast  Result Date: 11/26/2019 CLINICAL DATA:  Status post trauma. EXAM: CT CERVICAL SPINE WITHOUT CONTRAST TECHNIQUE: Multidetector CT imaging of the cervical spine was performed without intravenous contrast. Multiplanar CT image reconstructions were also generated. COMPARISON:  None. FINDINGS: Alignment: Normal. Skull base and vertebrae: No acute fracture. No primary bone lesion or focal pathologic process. Soft tissues and spinal canal: No prevertebral fluid or swelling. No visible canal hematoma. Disc levels: Mild to moderate severity endplate sclerosis is seen at the levels of C5-C6 and C6-C7. Mild intervertebral disc space narrowing is also seen at these levels. Mild to moderate severity bilateral multilevel facet joint hypertrophy is noted. Upper chest: A 6 mm bone island is seen within the paraspinal region of the third left rib. Other: There is moderate severity sphenoid sinus mucosal thickening. IMPRESSION: 1. No acute fracture or subluxation of the cervical spine. 2. Mild to moderate severity degenerative changes at the levels of C5-C6 and C6-C7. 3. Moderate severity sphenoid sinus mucosal thickening. Electronically Signed   By: Aram Candelahaddeus  Houston M.D.   On: 11/26/2019 21:14    Procedures Procedures (including critical care time)  Medications Ordered in ED Medications  oxyCODONE (Oxy IR/ROXICODONE) immediate release tablet 10 mg (10 mg Oral Given 11/27/19 0758)  atorvastatin (LIPITOR) tablet 80 mg (has no administration in time range)  nortriptyline (PAMELOR) capsule 25 mg (has no  administration in time range)  pantoprazole (PROTONIX) EC tablet 40 mg (has no administration in time  range)  gabapentin (NEURONTIN) capsule 1,200 mg (has no administration in time range)  fluticasone (FLONASE) 50 MCG/ACT nasal spray 1 spray (has no administration in time range)  montelukast (SINGULAIR) tablet 10 mg (has no administration in time range)  sodium chloride flush (NS) 0.9 % injection 3 mL (has no administration in time range)  sodium chloride flush (NS) 0.9 % injection 3 mL (has no administration in time range)  0.9 %  sodium chloride infusion (has no administration in time range)  acetaminophen (TYLENOL) tablet 650 mg (has no administration in time range)    Or  acetaminophen (TYLENOL) suppository 650 mg (has no administration in time range)  methocarbamol (ROBAXIN) 500 mg in dextrose 5 % 50 mL IVPB (has no administration in time range)  ondansetron (ZOFRAN) tablet 4 mg (has no administration in time range)    Or  ondansetron (ZOFRAN) injection 4 mg (has no administration in time range)  senna (SENOKOT) tablet 8.6 mg (has no administration in time range)  albuterol (PROVENTIL) (2.5 MG/3ML) 0.083% nebulizer solution 2.5 mg (has no administration in time range)  insulin aspart (novoLOG) injection 0-15 Units (0 Units Subcutaneous Not Given 11/27/19 0858)  insulin aspart (novoLOG) injection 0-5 Units (has no administration in time range)  fluticasone furoate-vilanterol (BREO ELLIPTA) 200-25 MCG/INH 1 puff (has no administration in time range)    And  umeclidinium bromide (INCRUSE ELLIPTA) 62.5 MCG/INH 1 puff (has no administration in time range)  0.9 %  sodium chloride infusion (has no administration in time range)  morphine 4 MG/ML injection 4 mg (4 mg Intravenous Given 11/26/19 2358)  ondansetron (ZOFRAN) injection 4 mg (4 mg Intravenous Given 11/26/19 2357)  lactated ringers bolus 500 mL (0 mLs Intravenous Stopped 11/27/19 0310)  acetaminophen (TYLENOL) tablet 650 mg (650 mg Oral Given  11/27/19 0201)  ondansetron (ZOFRAN) injection 4 mg (4 mg Intravenous Given 11/27/19 0758)    ED Course  I have reviewed the triage vital signs and the nursing notes.  Pertinent labs & imaging results that were available during my care of the patient were reviewed by me and considered in my medical decision making (see chart for details).  Clinical Course as of Nov 27 927  Fri Nov 27, 2019  0022 Temp(!): 101.7 F (38.7 C) [EH]  [phone] I spoke with Dr. Toniann Fail who willl see patient for admission.    [EH]    Clinical Course User Index [EH] Norman Clay   MDM Rules/Calculators/A&P                          59 year old male who presents for evaluation of multiple complaints.  He states since yesterday, he has felt feverish and had chills.  He reports some nausea/vomiting today states he has not been able to tolerate p.o.  He also reports that he has had month long history of neck and back pain.  He states he has chronic pain and states it flares up every once in a while.  He states that he recently moved and has had does be sleeping on an air mattress and attributed that to his pain.  Today, he lifted a heavy wardrobe and had worsening pain in his neck and back that radiated into his left lower extremity.  On initial arrival, he is afebrile, nontoxic-appearing.  He is slightly tachycardic.  On exam, his neck is supple and without rigidity.  Do not suspect meningitis.  I think these are mostly likely 2  separate issues/processes as his back pain has been an on going issue for several months.  I suspect one is most likely a viral in nature.  Additionally, I suspect his back pain is a continuation of his chronic back pain worsened by heavy lifting earlier today.  He also reports recent travel to [place] and has not gotten Covid vaccinated.  This may be Covid that is contributing to his symptoms.  We will plan to check labs, imaging.  Cervical spine shows no evidence of acute fracture or  bony abnormality.  Patient signed out to Lyndel Safe, PA-C with labs and imaging pending.   Kiondre Grenz was evaluated in Emergency Department on 11/27/2019 for the symptoms described in the history of present illness. He was evaluated in the context of the global COVID-19 pandemic, which necessitated consideration that the patient might be at risk for infection with the SARS-CoV-2 virus that causes COVID-19. Institutional protocols and algorithms that pertain to the evaluation of patients at risk for COVID-19 are in a state of rapid change based on information released by regulatory bodies including the CDC and federal and state organizations. These policies and algorithms were followed during the patient's care in the ED.   Portions of this note were generated with Scientist, clinical (histocompatibility and immunogenetics). Dictation errors may occur despite best attempts at proofreading.   Final Clinical Impression(s) / ED Diagnoses Final diagnoses:  Acute left-sided low back pain with left-sided sciatica  Viral illness    Rx / DC Orders ED Discharge Orders         Ordered    methocarbamol (ROBAXIN) 500 MG tablet  2 times daily     Discontinue  Reprint     11/26/19 2337    predniSONE (DELTASONE) 20 MG tablet  Daily     Discontinue  Reprint     11/26/19 2337           Maxwell Caul, PA-C 11/27/19 0930    Gwyneth Sprout, MD 11/27/19 1549

## 2019-11-26 NOTE — ED Provider Notes (Signed)
I assumed care of patient from L. Layden PA-C, please see her note for full H and P.     Physical Exam  BP (!) 151/85 (BP Location: Right Arm)   Pulse (!) 116   Temp 99.5 F (37.5 C) (Oral)   Resp 20   SpO2 98%   Physical Exam Vitals and nursing note reviewed.  Constitutional:      General: He is not in acute distress.    Appearance: He is well-developed. He is not diaphoretic.  HENT:     Head: Normocephalic and atraumatic.  Eyes:     General: No scleral icterus.       Right eye: No discharge.        Left eye: No discharge.     Conjunctiva/sclera: Conjunctivae normal.  Cardiovascular:     Rate and Rhythm: Normal rate and regular rhythm.  Pulmonary:     Effort: Pulmonary effort is normal. No respiratory distress.     Breath sounds: No stridor.  Abdominal:     General: There is no distension.  Musculoskeletal:        General: No deformity.     Cervical back: Normal range of motion.     Comments: Diffuse back pain  Skin:    General: Skin is warm and dry.  Neurological:     Mental Status: He is alert.     Motor: No abnormal muscle tone.  Psychiatric:        Mood and Affect: Mood normal.        Behavior: Behavior normal.     ED Course/Procedures   Clinical Course as of Nov 27 823  Fri Nov 27, 2019  0022 Temp(!): 101.7 F (38.7 C) [EH]  [phone] I spoke with Dr. Hal Hope who willl see patient for admission.    [EH]    Clinical Course User Index [EH] Lorin Glass, PA-C    Procedures   Labs Reviewed  COMPREHENSIVE METABOLIC PANEL - Abnormal; Notable for the following components:      Result Value   Sodium 132 (*)    CO2 18 (*)    Total Protein 8.5 (*)    Total Bilirubin 2.1 (*)    Anion gap 16 (*)    All other components within normal limits  CBC WITH DIFFERENTIAL/PLATELET - Abnormal; Notable for the following components:   WBC 10.8 (*)    Neutro Abs 8.7 (*)    All other components within normal limits  URINALYSIS, ROUTINE W REFLEX MICROSCOPIC  - Abnormal; Notable for the following components:   Glucose, UA >=500 (*)    Ketones, ur 80 (*)    All other components within normal limits  SEDIMENTATION RATE - Abnormal; Notable for the following components:   Sed Rate 95 (*)    All other components within normal limits  C-REACTIVE PROTEIN - Abnormal; Notable for the following components:   CRP 17.4 (*)    All other components within normal limits  SARS CORONAVIRUS 2 BY RT PCR (HOSPITAL ORDER, [facility])  URINE CULTURE  CULTURE, BLOOD (ROUTINE X 2)  CULTURE, BLOOD (ROUTINE X 2)  LIPASE, BLOOD  LACTIC ACID, PLASMA  LACTIC ACID, PLASMA    DG Chest 2 View  Result Date: 11/26/2019 CLINICAL DATA:  Cough.  Back pain after lifting heavy object today. EXAM: CHEST - 2 VIEW COMPARISON:  None. FINDINGS: Heart size and pulmonary vascularity are normal. Lungs are clear. No pleural effusions. No pneumothorax. Mediastinal contours appear intact.  Degenerative changes in the spine. IMPRESSION: No active cardiopulmonary disease. Electronically Signed   By: Lucienne Capers M.D.   On: 11/26/2019 23:46   DG Thoracic Spine 2 View  Result Date: 11/26/2019 CLINICAL DATA:  Back pain after lifting heavy object today. EXAM: THORACIC SPINE 2 VIEWS COMPARISON:  None. FINDINGS: Normal alignment of the thoracic spine. No vertebral compression deformities. Degenerative changes with disc space narrowing and endplate hypertrophic changes mostly in the midthoracic region. Bone cortex appears intact. No paraspinal soft tissue swelling. IMPRESSION: Degenerative changes in the thoracic spine. No acute displaced fractures identified. Electronically Signed   By: Lucienne Capers M.D.   On: 11/26/2019 23:43   DG Lumbar Spine Complete  Result Date: 11/26/2019 CLINICAL DATA:  Back pain after lifting a heavy object today. EXAM: LUMBAR SPINE - COMPLETE 4+ VIEW COMPARISON:  None. FINDINGS: Five lumbar type vertebral bodies. Normal alignment. No vertebral  compression deformities. Mild degenerative changes with slight disc space narrowing and endplate osteophyte formation. Sclerosis and prominent degenerative changes in the posterior elements at L5-S1 on the left. This may indicate spondylolysis. No spondylolisthesis. Visualized sacrum appears intact. IMPRESSION: 1. No acute displaced fractures identified. 2. Possible spondylolysis at L5-S1 on the left with asymmetric prominence of degenerative changes in the posterior elements. Electronically Signed   By: Lucienne Capers M.D.   On: 11/26/2019 23:45   CT Head Wo Contrast  Result Date: 11/27/2019 CLINICAL DATA:  Worsening headache, immunodeficiency, fever, previous sinus and nasal surgery for MRSA infection. EXAM: CT HEAD WITHOUT CONTRAST TECHNIQUE: Contiguous axial images were obtained from the base of the skull through the vertex without intravenous contrast. COMPARISON:  None. FINDINGS: Brain: No evidence of acute infarction, hemorrhage, hydrocephalus, extra-axial collection or mass lesion/mass effect. Vascular: No hyperdense vessel or unexpected calcification. Skull: Normal. Negative for fracture or focal lesion. Sinuses/Orbits: Air-fluid level in the sphenoid sinus likely sinusitis. Mucosal thickening in the paranasal sinuses otherwise. Postoperative changes with partial left turbinate tectum ease and resection of left ethmoid septations. Mastoid air cells are clear. Other: None. IMPRESSION: 1. No acute intracranial abnormalities. 2. Air-fluid level in the sphenoid sinus likely sinusitis. Electronically Signed   By: Lucienne Capers M.D.   On: 11/27/2019 03:06   CT Cervical Spine Wo Contrast  Result Date: 11/26/2019 CLINICAL DATA:  Status post trauma. EXAM: CT CERVICAL SPINE WITHOUT CONTRAST TECHNIQUE: Multidetector CT imaging of the cervical spine was performed without intravenous contrast. Multiplanar CT image reconstructions were also generated. COMPARISON:  None. FINDINGS: Alignment: Normal. Skull base  and vertebrae: No acute fracture. No primary bone lesion or focal pathologic process. Soft tissues and spinal canal: No prevertebral fluid or swelling. No visible canal hematoma. Disc levels: Mild to moderate severity endplate sclerosis is seen at the levels of C5-C6 and C6-C7. Mild intervertebral disc space narrowing is also seen at these levels. Mild to moderate severity bilateral multilevel facet joint hypertrophy is noted. Upper chest: A 6 mm bone island is seen within the paraspinal region of the third left rib. Other: There is moderate severity sphenoid sinus mucosal thickening. IMPRESSION: 1. No acute fracture or subluxation of the cervical spine. 2. Mild to moderate severity degenerative changes at the levels of C5-C6 and C6-C7. 3. Moderate severity sphenoid sinus mucosal thickening. Electronically Signed   By: Virgina Norfolk M.D.   On: 11/26/2019 21:14     MDM   Rectal temp Covid and labs Acute on chronic back pain   Patient developed a fever.  He is febrile at 101.7.  I spoke with him, he reports that his back pain started before he lifted a heavy box.  He notes that he has had MRSA, and tells me that he has had multiple sinus surgeries including transnasal brain surgery to "washout" MRSA.  He also reportedly has an immune deficiency on chart review.  He states that this does not feel like when he had MRSA in his head before.  He does report that he feels slightly rattling in his chest.  He reports hematuria however that is unchanged over a year.  He is not vaccinated against covid.   Blood cultures and urine cultures were already added.  Additionally will add on ESR and CRP and obtain a noncontrast CT scan on his head.  Based on these will obtain additional testing as needed.   ESR is elevated at 95, CRP is markedly elevated at 17.4.  Patient has underlying IgG deficiency.  His Covid test is negative.  I discussed the patient with Dr. Tomi Bamberger.  Patient is unable to localize the pain in his  back.  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  Dr. Tomi Bamberger recommended MRIs of brain, C, T, and L-spine to evaluate for possible abscess, discitis, or osteomyelitis given his significantly elevated CRP and ESR.    I spoke with Dr. Hal Hope who will see patient for admission.       Lorin Glass, PA-C 11/27/19 3151    Rolland Porter, MD 11/27/19 [phone]

## 2019-11-27 ENCOUNTER — Observation Stay (HOSPITAL_COMMUNITY): Payer: BC Managed Care – PPO

## 2019-11-27 ENCOUNTER — Encounter (HOSPITAL_COMMUNITY): Payer: Self-pay | Admitting: Internal Medicine

## 2019-11-27 ENCOUNTER — Emergency Department (HOSPITAL_COMMUNITY): Payer: BC Managed Care – PPO

## 2019-11-27 DIAGNOSIS — M549 Dorsalgia, unspecified: Secondary | ICD-10-CM | POA: Diagnosis present

## 2019-11-27 DIAGNOSIS — E119 Type 2 diabetes mellitus without complications: Secondary | ICD-10-CM

## 2019-11-27 DIAGNOSIS — M5442 Lumbago with sciatica, left side: Secondary | ICD-10-CM | POA: Diagnosis not present

## 2019-11-27 DIAGNOSIS — R6510 Systemic inflammatory response syndrome (SIRS) of non-infectious origin without acute organ dysfunction: Secondary | ICD-10-CM | POA: Diagnosis not present

## 2019-11-27 DIAGNOSIS — R509 Fever, unspecified: Secondary | ICD-10-CM

## 2019-11-27 DIAGNOSIS — Z794 Long term (current) use of insulin: Secondary | ICD-10-CM

## 2019-11-27 LAB — URINALYSIS, ROUTINE W REFLEX MICROSCOPIC
Bacteria, UA: NONE SEEN
Bilirubin Urine: NEGATIVE
Glucose, UA: >=500 mg/dL — AB
Hgb urine dipstick: NEGATIVE
Ketones, ur: 80 mg/dL — AB
Leukocytes,Ua: NEGATIVE
Nitrite: NEGATIVE
Protein, ur: NEGATIVE mg/dL
Specific Gravity, Urine: 1.024 (ref 1.005–1.030)
pH: 5 (ref 5.0–8.0)

## 2019-11-27 LAB — CBC WITH DIFFERENTIAL/PLATELET
Abs Immature Granulocytes: 0.05 10*3/uL (ref 0.00–0.07)
Basophils Absolute: 0.1 10*3/uL (ref 0.0–0.1)
Basophils Relative: 1 %
Eosinophils Absolute: 0 10*3/uL (ref 0.0–0.5)
Eosinophils Relative: 0 %
HCT: 40.2 % (ref 39.0–52.0)
Hemoglobin: 13.1 g/dL (ref 13.0–17.0)
Immature Granulocytes: 1 %
Lymphocytes Relative: 10 %
Lymphs Abs: 1.1 10*3/uL (ref 0.7–4.0)
MCH: 29.4 pg (ref 26.0–34.0)
MCHC: 32.6 g/dL (ref 30.0–36.0)
MCV: 90.1 fL (ref 80.0–100.0)
Monocytes Absolute: 0.8 10*3/uL (ref 0.1–1.0)
Monocytes Relative: 8 %
Neutro Abs: 8.7 10*3/uL — ABNORMAL HIGH (ref 1.7–7.7)
Neutrophils Relative %: 80 %
Platelets: 289 10*3/uL (ref 150–400)
RBC: 4.46 MIL/uL (ref 4.22–5.81)
RDW: 13 % (ref 11.5–15.5)
WBC: 10.8 10*3/uL — ABNORMAL HIGH (ref 4.0–10.5)
nRBC: 0 % (ref 0.0–0.2)

## 2019-11-27 LAB — COMPREHENSIVE METABOLIC PANEL
ALT: 19 U/L (ref 0–44)
AST: 18 U/L (ref 15–41)
Albumin: 4.4 g/dL (ref 3.5–5.0)
Alkaline Phosphatase: 124 U/L (ref 38–126)
Anion gap: 16 — ABNORMAL HIGH (ref 5–15)
BUN: 14 mg/dL (ref 8–23)
CO2: 18 mmol/L — ABNORMAL LOW (ref 22–32)
Calcium: 9.2 mg/dL (ref 8.9–10.3)
Chloride: 98 mmol/L (ref 98–111)
Creatinine, Ser: 1.01 mg/dL (ref 0.61–1.24)
GFR calc Af Amer: >60 mL/min (ref >60)
GFR calc non Af Amer: >60 mL/min (ref >60)
Glucose, Bld: 93 mg/dL (ref 70–99)
Potassium: 4 mmol/L (ref 3.5–5.1)
Sodium: 132 mmol/L — ABNORMAL LOW (ref 135–145)
Total Bilirubin: 2.1 mg/dL — ABNORMAL HIGH (ref 0.3–1.2)
Total Protein: 8.5 g/dL — ABNORMAL HIGH (ref 6.5–8.1)

## 2019-11-27 LAB — LACTIC ACID, PLASMA
Lactic Acid, Venous: 0.9 mmol/L (ref 0.5–1.9)
Lactic Acid, Venous: 0.8 mmol/L (ref 0.5–1.9)

## 2019-11-27 LAB — LIPASE, BLOOD: Lipase: 25 U/L (ref 11–51)

## 2019-11-27 LAB — GLUCOSE, CAPILLARY
Glucose-Capillary: 84 mg/dL (ref 70–99)
Glucose-Capillary: 77 mg/dL (ref 70–99)

## 2019-11-27 LAB — CBG MONITORING, ED
Glucose-Capillary: 77 mg/dL (ref 70–99)
Glucose-Capillary: 84 mg/dL (ref 70–99)
Glucose-Capillary: 83 mg/dL (ref 70–99)

## 2019-11-27 LAB — C-REACTIVE PROTEIN: CRP: 17.4 mg/dL — ABNORMAL HIGH (ref <1.0)

## 2019-11-27 LAB — SEDIMENTATION RATE: Sed Rate: 95 mm/hr — ABNORMAL HIGH (ref 0–16)

## 2019-11-27 LAB — SARS CORONAVIRUS 2 BY RT PCR (HOSPITAL ORDER, PERFORMED IN ~~LOC~~ HOSPITAL LAB): SARS Coronavirus 2: NEGATIVE

## 2019-11-27 IMAGING — MR MR CERVICAL SPINE WO/W CM
7 of 8 series · 33 of 48 positions shown · IV contrast (agent unspecified)
Comparison: None.

CLINICAL DATA: Severe headaches and total spine pain, history of
sinusitis with MRSA infections

EXAM:
MRI CERVICAL, THORACIC AND LUMBAR SPINE WITHOUT AND WITH CONTRAST
TECHNIQUE: Multiplanar and multiecho pulse sequences of the cervical spine, to
include the craniocervical junction and cervicothoracic junction,
and thoracic and lumbar spine, were obtained without and with
intravenous contrast.

[Series 26: T1 · sagittal · 3.0mm · 0.56mm/px · 4 of 15 slices shown (1 of 2)]
[im 1/15]
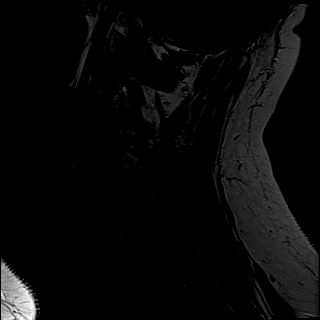
[im 5/15]
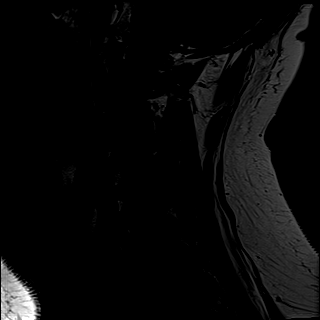
[im 10/15]
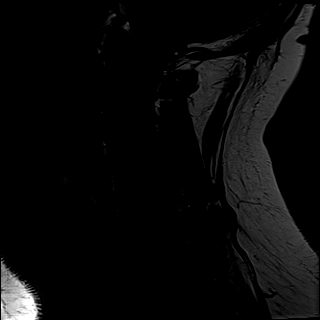
[im 15/15]
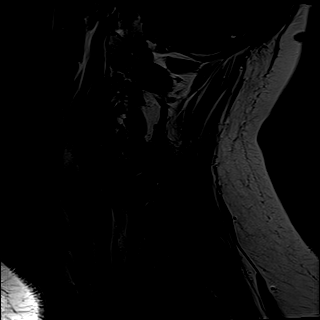

[Series 27: T2 · sagittal · 3.0mm · 0.56mm/px · 4 of 15 slices shown (1 of 2)]
[im 1/15]
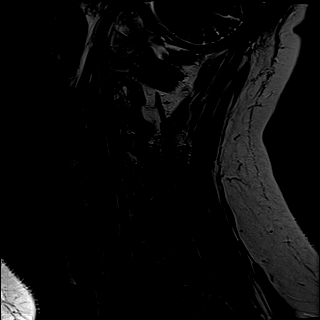
[im 5/15]
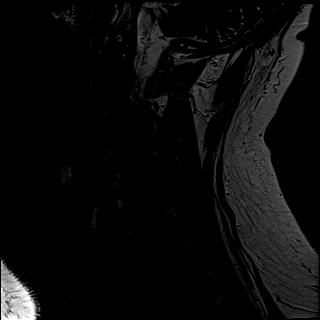
[im 10/15]
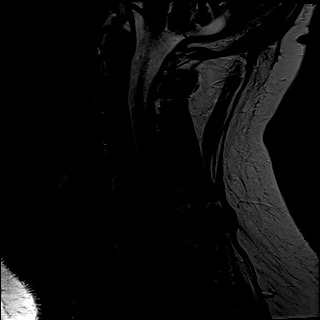
[im 15/15]
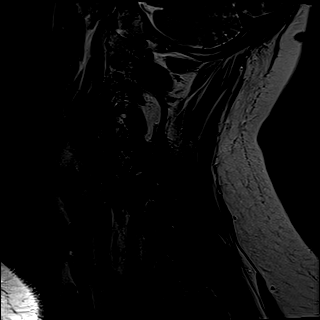

[Series 28: STIR · sagittal · 3.0mm · 0.70mm/px · 4 of 15 slices shown]
[im 1/15]
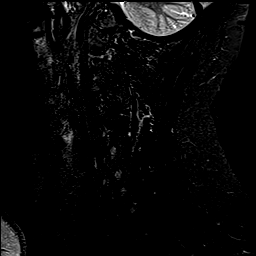
[im 5/15]
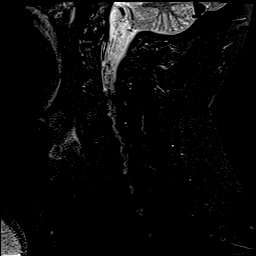
[im 10/15]
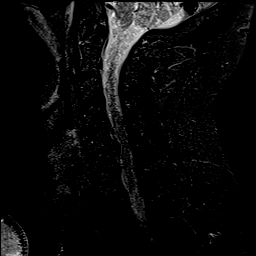
[im 15/15]
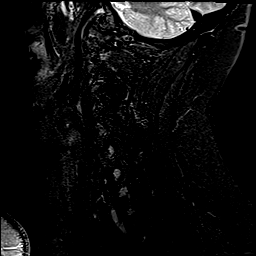

[Series 29: T2 · axial · 3.0mm · 0.70mm/px · z∈[-224,-125]mm · 8 of 30 slices shown (2 of 2)]
[im 1/30]
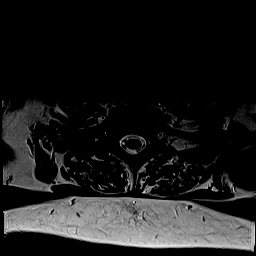
[im 5/30]
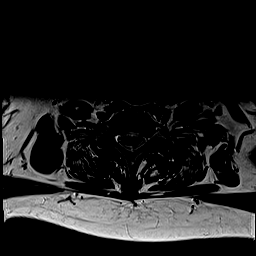
[im 9/30]
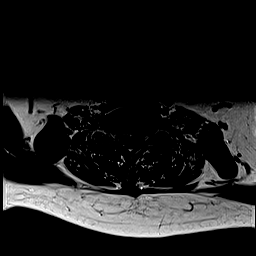
[im 13/30]
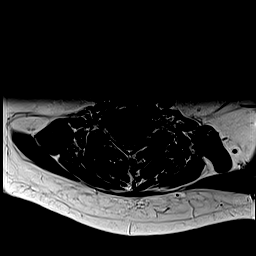
[im 17/30]
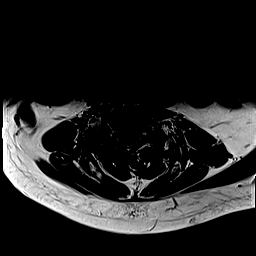
[im 21/30]
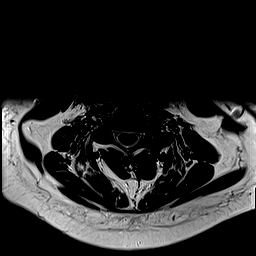
[im 25/30]
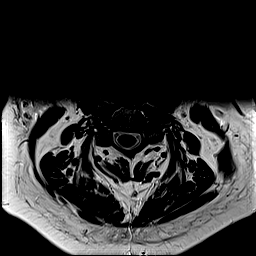
[im 30/30]
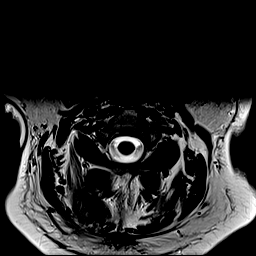

[Series 31: T1 · axial · 3.0mm · 0.35mm/px · z∈[-224,-125]mm · 8 of 30 slices shown (2 of 2)]
[im 1/30]
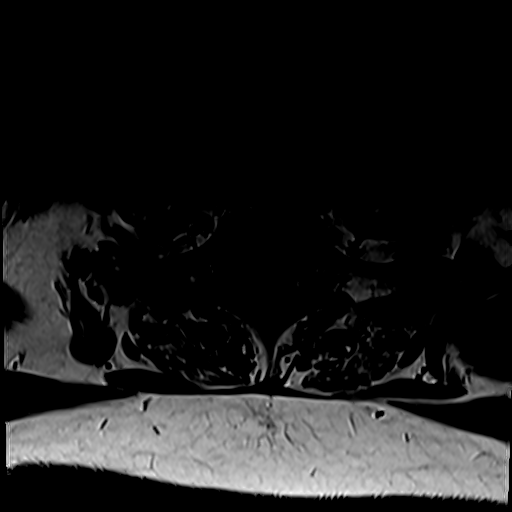
[im 5/30]
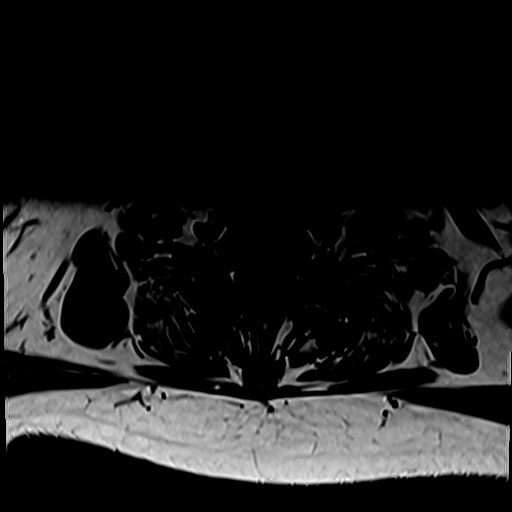
[im 9/30]
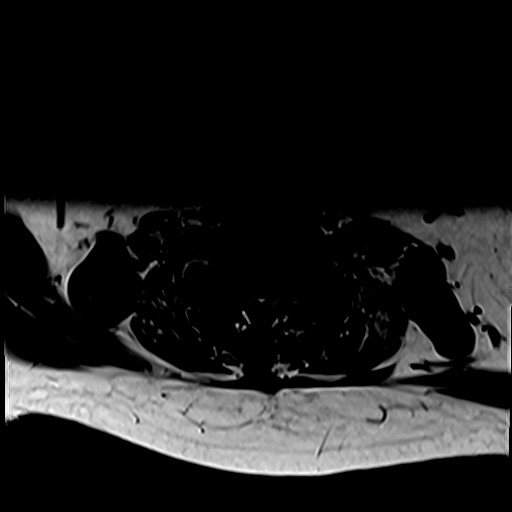
[im 13/30]
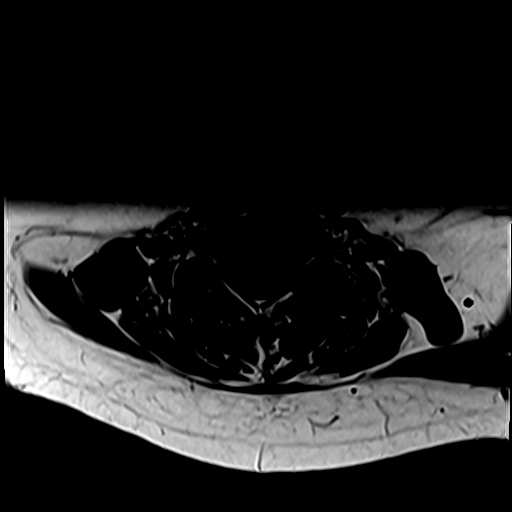
[im 17/30]
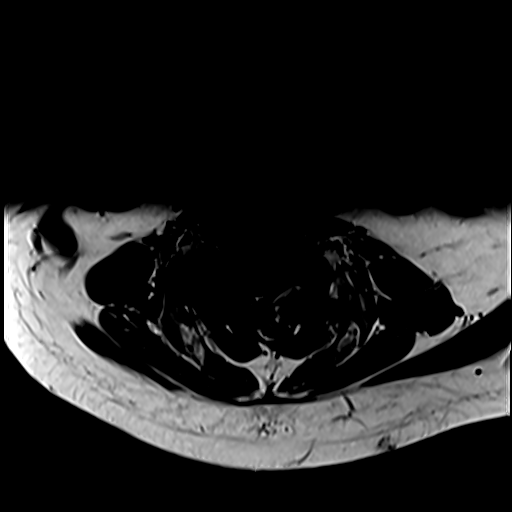
[im 21/30]
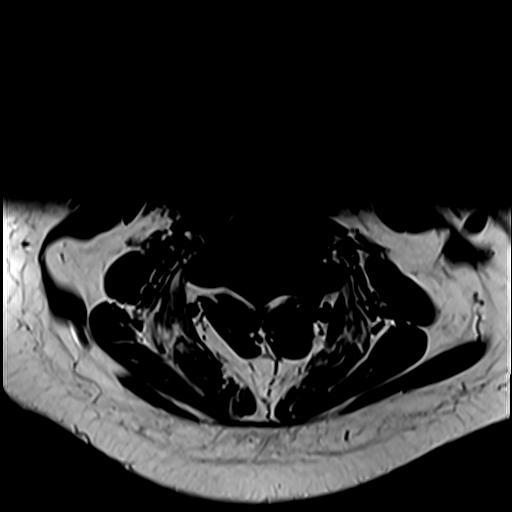
[im 25/30]
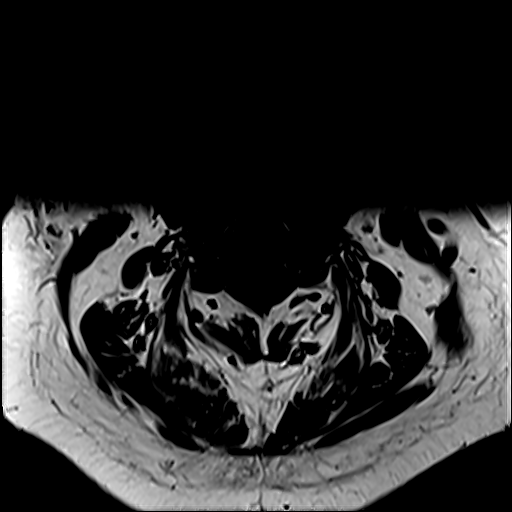
[im 30/30]
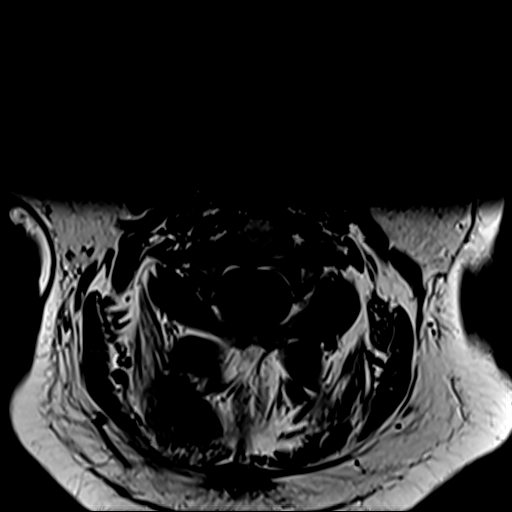

[Series 75: T1 post-contrast · axial · 3.0mm · 0.35mm/px · 1 of 30 slices shown]
[im 1/30]
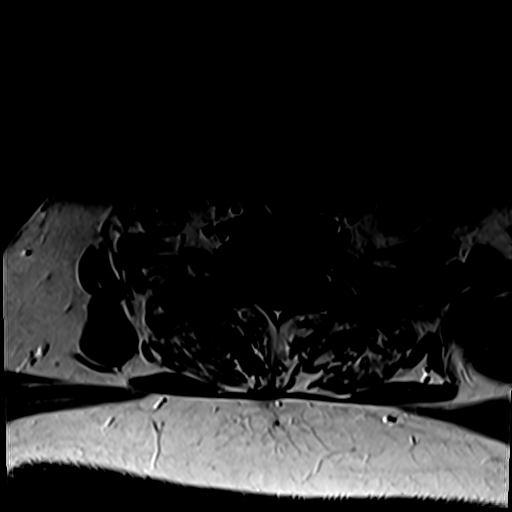

[Series 76: T1 fat-sat post-contrast · sagittal · 3.0mm · 0.56mm/px · 4 of 15 slices shown]
[im 1/15]
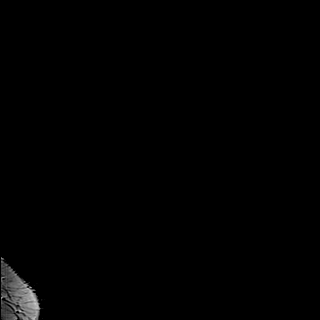
[im 5/15]
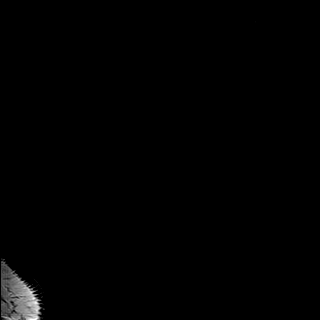
[im 10/15]
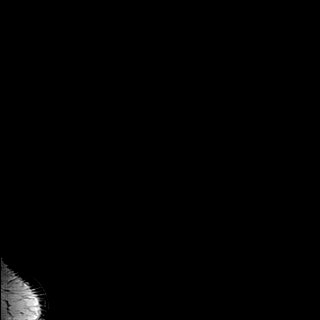
[im 15/15]
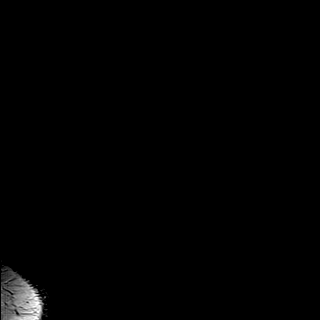

[33 of 48 positions shown; findings below may reference images not displayed]

FINDINGS: MRI CERVICAL SPINE

Alignment: Anteroposterior alignment is maintained.

Vertebrae: Vertebral body heights are preserved.

Cord: No abnormal signal.  No abnormal intrathecal enhancement.

Posterior Fossa, vertebral arteries, paraspinal tissues:
Unremarkable.

Disc levels:

C2-C3:  Mild facet hypertrophy.  No canal or foraminal stenosis.

C3-C4: Mild uncovertebral hypertrophy. Moderate left facet
hypertrophy. No canal or right foraminal stenosis. Mild left
foraminal stenosis.

C4-C5: Disc bulge with endplate osteophytes slightly eccentric to
the left. No canal or right foraminal stenosis. Mild to moderate
left foraminal stenosis.

C5-C6: Disc bulge with endplate osteophytes. Uncovertebral
hypertrophy. Mild canal stenosis. Mild foraminal stenosis.

C6-C7: Disc bulge with endplate osteophytes. No canal or right
foraminal stenosis. Minor left foraminal stenosis.

C7-T1:  No canal or foraminal stenosis.

MRI THORACIC SPINE

Alignment:  Anteroposterior alignment is maintained.

Vertebrae: There is no marrow edema.  No suspicious osseous lesion.

Cord: No abnormal cord signal. No abnormal intrathecal enhancement.

Paraspinal and other soft tissues: Left basilar
atelectasis/consolidation.

Disc levels: Minor degenerative disc disease is present. For
example, there is a small right paracentral disc protrusion at
T2-T3. There is facet hypertrophy, greatest at T3-T4 and T4-T5 on
the right.

MRI LUMBAR SPINE

Segmentation:  Standard.

Alignment:  Anteroposterior alignment is maintained.

Vertebrae: Vertebral body heights are preserved. There is no marrow
edema. There is no suspicious osseous lesion.

Conus medullaris and cauda equina: Conus extends to the L1 level.
Conus and cauda equina appear normal. No abnormal intrathecal
enhancement.

Paraspinal and other soft tissues: Unremarkable.

Disc levels: There is no significant canal stenosis at any level.
Marked left facet arthropathy with hypertrophic changes present at
L5-S1. Results in mild narrowing of the left L5-S1 foramen.
IMPRESSION: No significant abnormality identified. Mild degenerative changes are
present.

## 2019-11-27 IMAGING — MR MR THORACIC SPINE WO/W CM
8 of 9 series · 33 of 48 positions shown · IV contrast (agent unspecified)
Comparison: None.

CLINICAL DATA: Severe headaches and total spine pain, history of
sinusitis with MRSA infections

EXAM:
MRI CERVICAL, THORACIC AND LUMBAR SPINE WITHOUT AND WITH CONTRAST
TECHNIQUE: Multiplanar and multiecho pulse sequences of the cervical spine, to
include the craniocervical junction and cervicothoracic junction,
and thoracic and lumbar spine, were obtained without and with
intravenous contrast.

[Series 56: T1 · sagittal · 4.0mm · 1.56mm/px · 1 of 11 slices shown (1 of 3)]
[im 1/11]
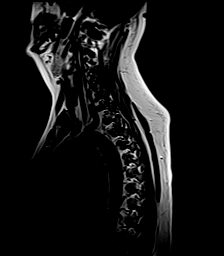

[Series 57: STIR · sagittal · 3.0mm · 0.94mm/px · 3 of 17 slices shown]
[im 1/17]
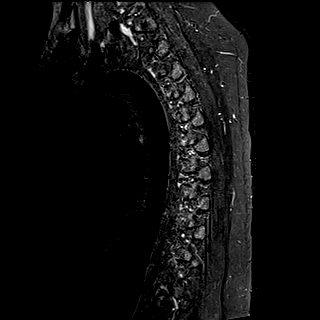
[im 9/17]
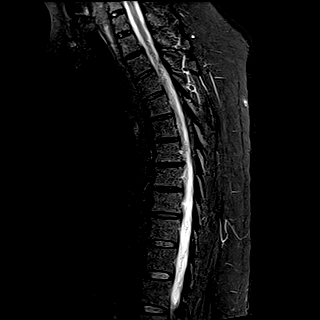
[im 17/17]
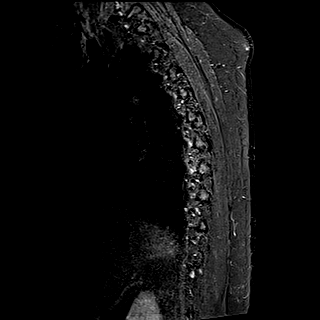

[Series 58: T1 · sagittal · 3.0mm · 1.00mm/px · 4 of 17 slices shown (2 of 3)]
[im 1/17]
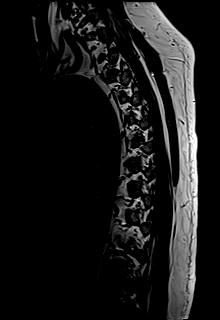
[im 6/17]
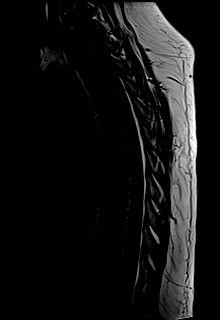
[im 11/17]
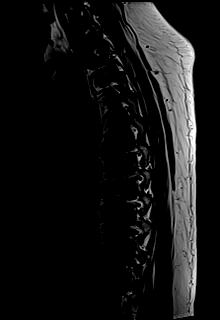
[im 17/17]
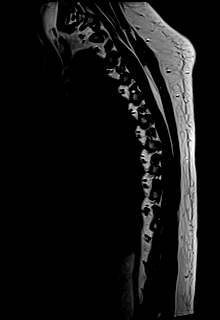

[Series 59: T2 · sagittal · 3.0mm · 0.83mm/px · 4 of 17 slices shown (1 of 2)]
[im 1/17]
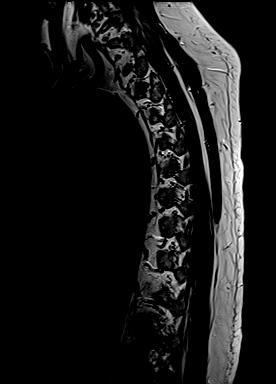
[im 6/17]
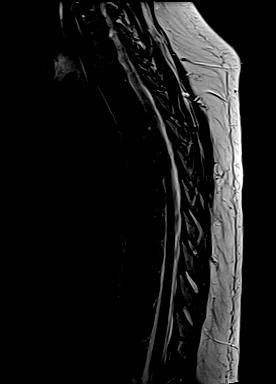
[im 11/17]
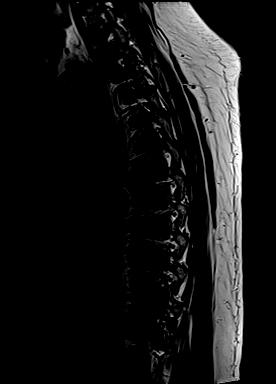
[im 17/17]
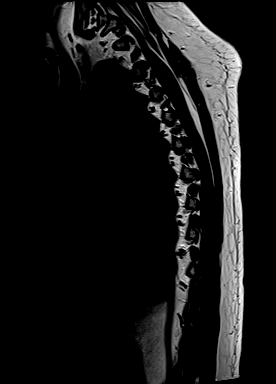

[Series 60: T2 · axial · 4.0mm · 0.78mm/px · z∈[-441,-227]mm · 8 of 36 slices shown (2 of 2)]
[im 1/36]
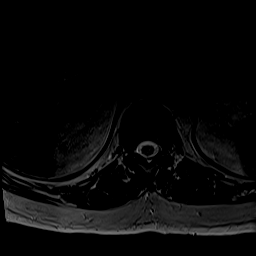
[im 6/36]
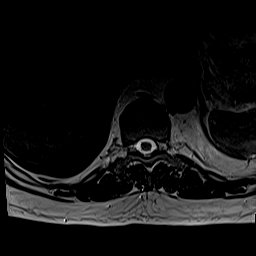
[im 11/36]
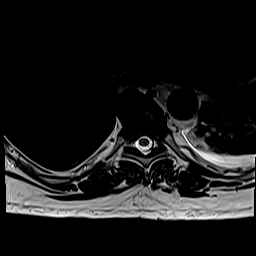
[im 16/36]
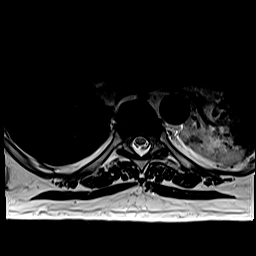
[im 21/36]
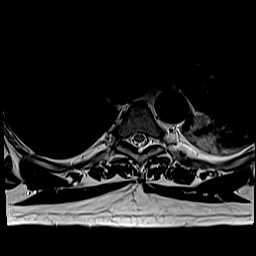
[im 26/36]
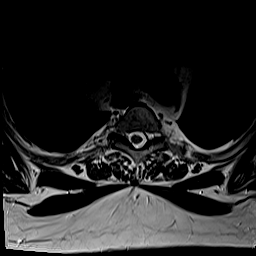
[im 31/36]
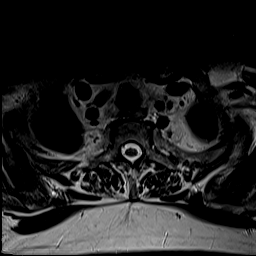
[im 36/36]
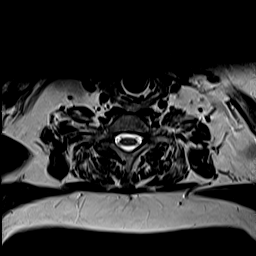

[Series 62: T1 · axial · 4.0mm · 0.39mm/px · z∈[-441,-227]mm · 8 of 36 slices shown (3 of 3)]
[im 1/36]
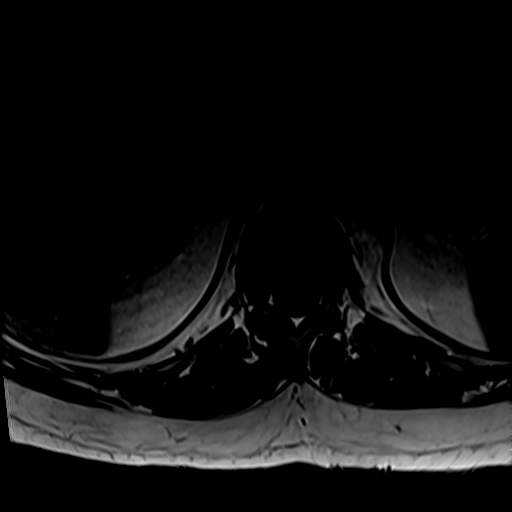
[im 6/36]
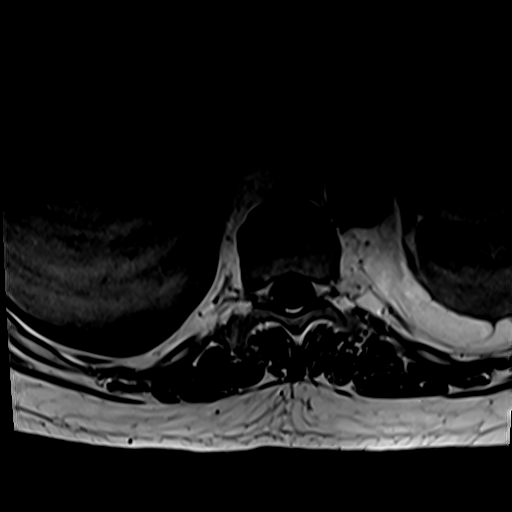
[im 11/36]
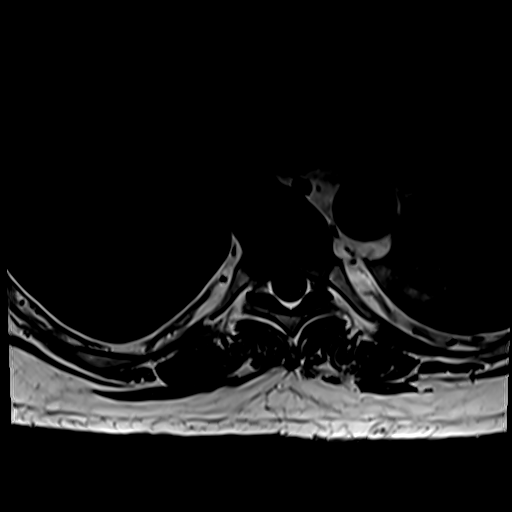
[im 16/36]
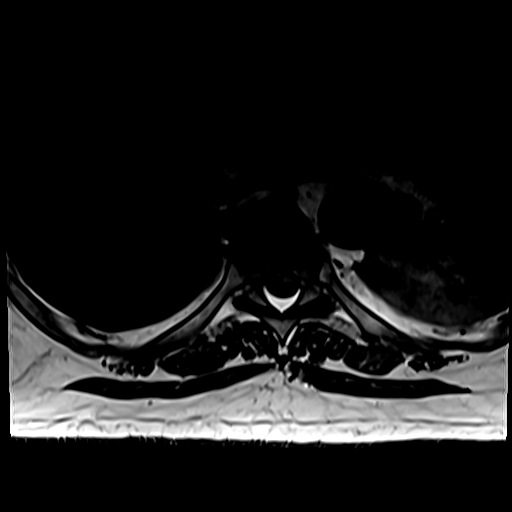
[im 21/36]
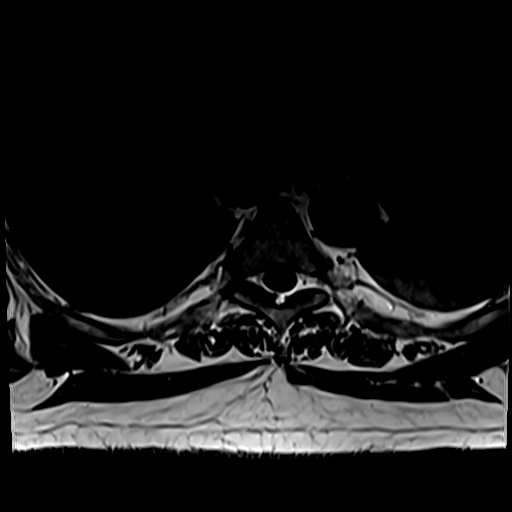
[im 26/36]
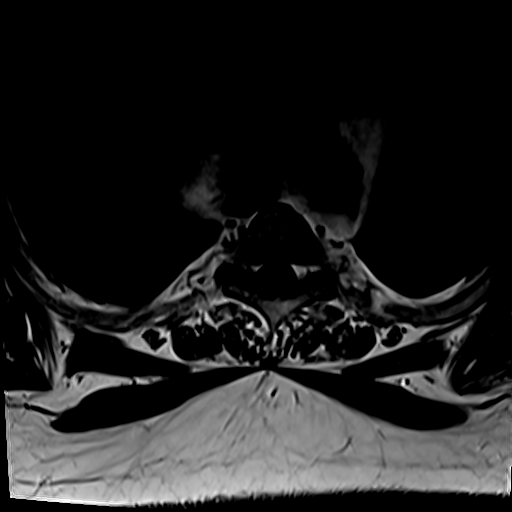
[im 31/36]
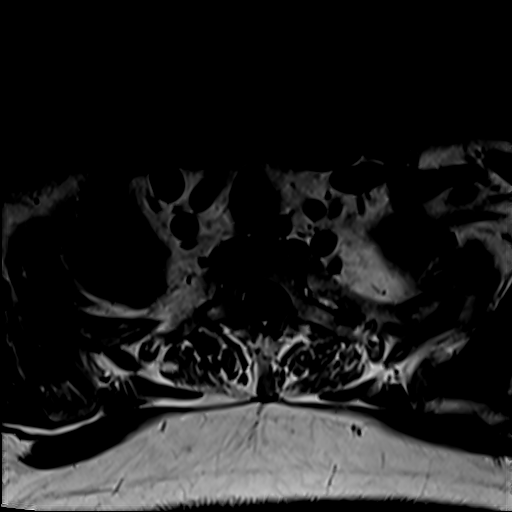
[im 36/36]
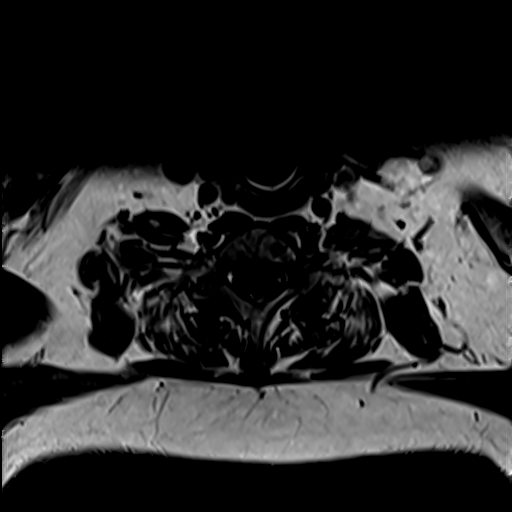

[Series 77: T1 post-contrast · axial · 4.0mm · 0.39mm/px · 1 of 36 slices shown]
[im 1/36]
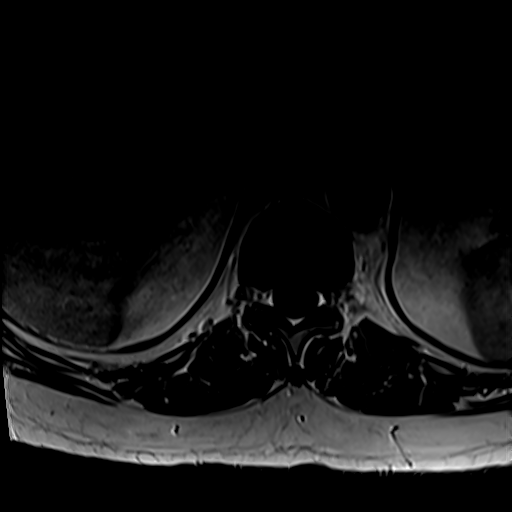

[Series 78: T1 fat-sat post-contrast · sagittal · 3.0mm · 1.00mm/px · 4 of 17 slices shown]
[im 1/17]
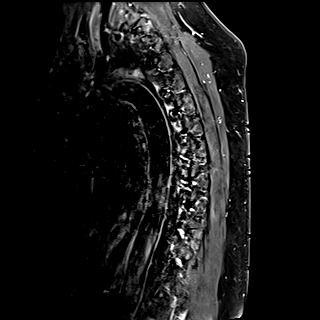
[im 6/17]
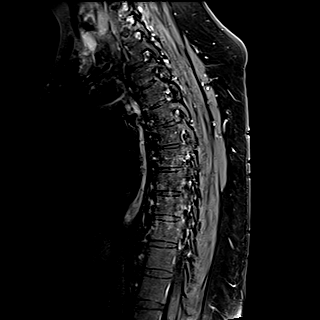
[im 11/17]
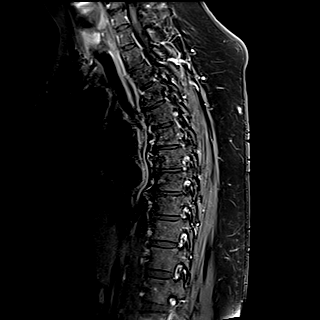
[im 17/17]
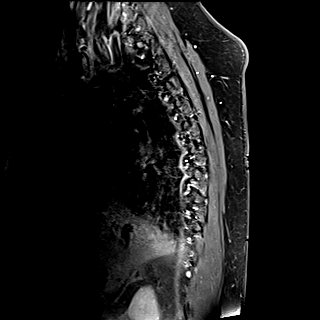

[33 of 48 positions shown; findings below may reference images not displayed]

FINDINGS: MRI CERVICAL SPINE

Alignment: Anteroposterior alignment is maintained.

Vertebrae: Vertebral body heights are preserved.

Cord: No abnormal signal.  No abnormal intrathecal enhancement.

Posterior Fossa, vertebral arteries, paraspinal tissues:
Unremarkable.

Disc levels:

C2-C3:  Mild facet hypertrophy.  No canal or foraminal stenosis.

C3-C4: Mild uncovertebral hypertrophy. Moderate left facet
hypertrophy. No canal or right foraminal stenosis. Mild left
foraminal stenosis.

C4-C5: Disc bulge with endplate osteophytes slightly eccentric to
the left. No canal or right foraminal stenosis. Mild to moderate
left foraminal stenosis.

C5-C6: Disc bulge with endplate osteophytes. Uncovertebral
hypertrophy. Mild canal stenosis. Mild foraminal stenosis.

C6-C7: Disc bulge with endplate osteophytes. No canal or right
foraminal stenosis. Minor left foraminal stenosis.

C7-T1:  No canal or foraminal stenosis.

MRI THORACIC SPINE

Alignment:  Anteroposterior alignment is maintained.

Vertebrae: There is no marrow edema.  No suspicious osseous lesion.

Cord: No abnormal cord signal. No abnormal intrathecal enhancement.

Paraspinal and other soft tissues: Left basilar
atelectasis/consolidation.

Disc levels: Minor degenerative disc disease is present. For
example, there is a small right paracentral disc protrusion at
T2-T3. There is facet hypertrophy, greatest at T3-T4 and T4-T5 on
the right.

MRI LUMBAR SPINE

Segmentation:  Standard.

Alignment:  Anteroposterior alignment is maintained.

Vertebrae: Vertebral body heights are preserved. There is no marrow
edema. There is no suspicious osseous lesion.

Conus medullaris and cauda equina: Conus extends to the L1 level.
Conus and cauda equina appear normal. No abnormal intrathecal
enhancement.

Paraspinal and other soft tissues: Unremarkable.

Disc levels: There is no significant canal stenosis at any level.
Marked left facet arthropathy with hypertrophic changes present at
L5-S1. Results in mild narrowing of the left L5-S1 foramen.
IMPRESSION: No significant abnormality identified. Mild degenerative changes are
present.

## 2019-11-27 IMAGING — CT CT HEAD W/O CM
3 series · 15 of 47 positions shown, 18 images · non-contrast
Comparison: None.

CLINICAL DATA: Worsening headache, immunodeficiency, fever,
previous sinus and nasal surgery for MRSA infection.

EXAM:
CT HEAD WITHOUT CONTRAST
TECHNIQUE: Contiguous axial images were obtained from the base of the skull
through the vertex without intravenous contrast.

[Series 2: head wo · axial · 0.47mm/px · z∈[-107,+18]mm · 9 of 31 slices shown, 12 images]
[im 3/31  brain]
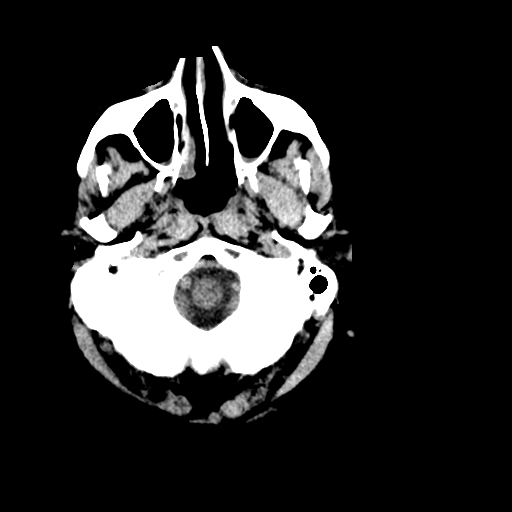
[im 3/31  bone]
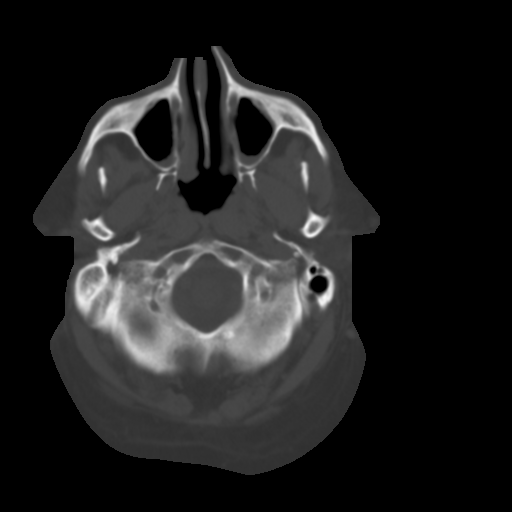
[im 6/31  brain]
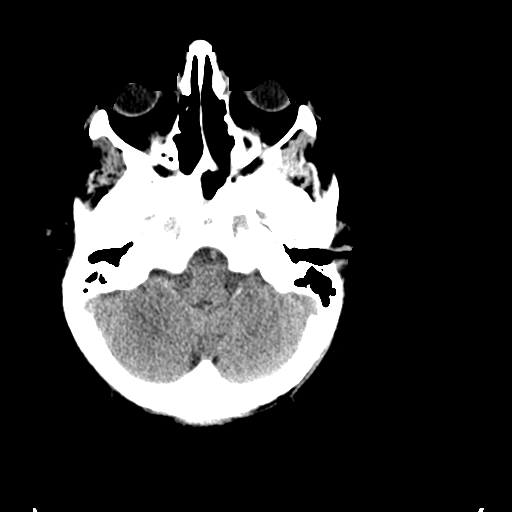
[im 9/31  brain]
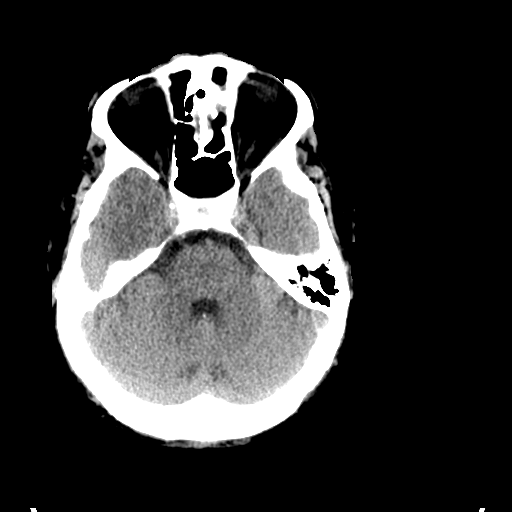
[im 12/31  brain]
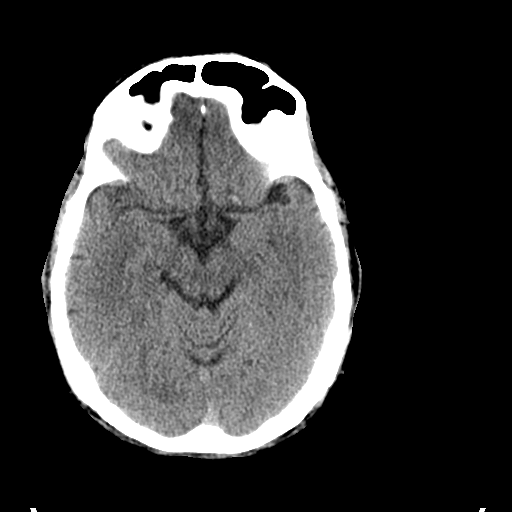
[im 16/31  brain]
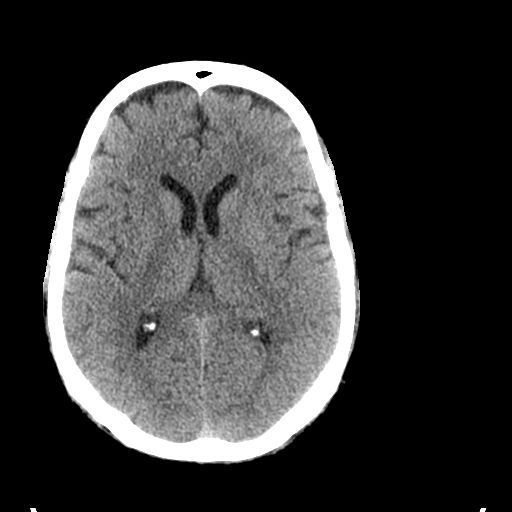
[im 16/31  bone]
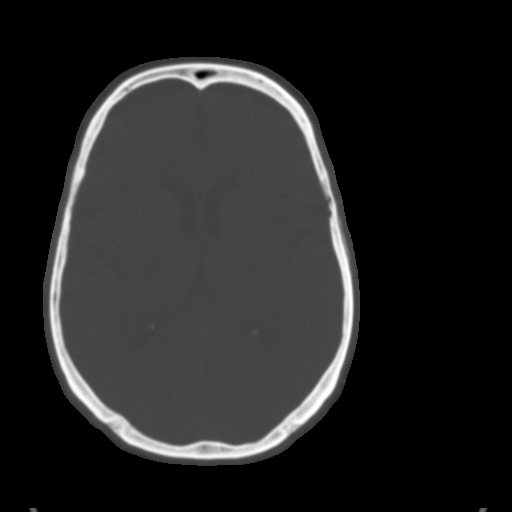
[im 19/31  brain]
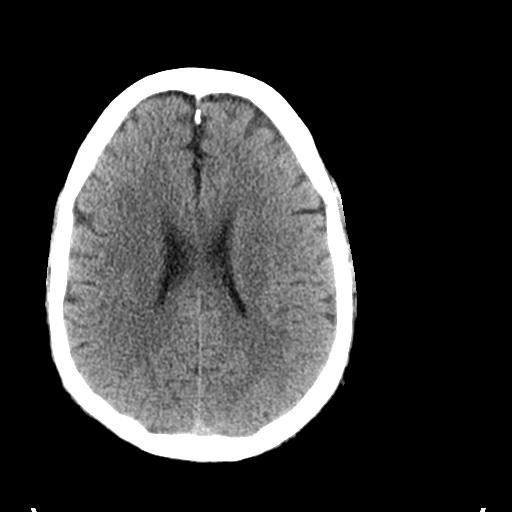
[im 22/31  brain]
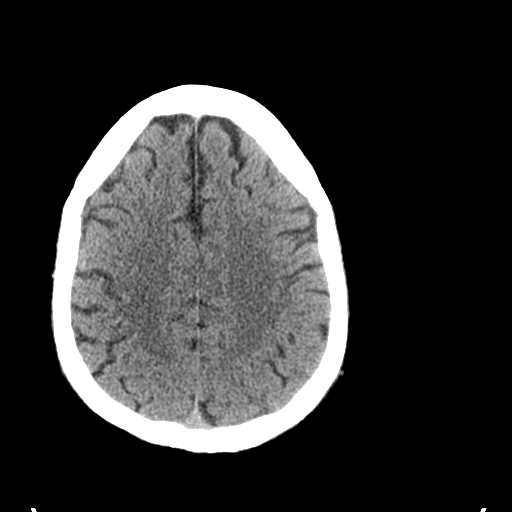
[im 25/31  brain]
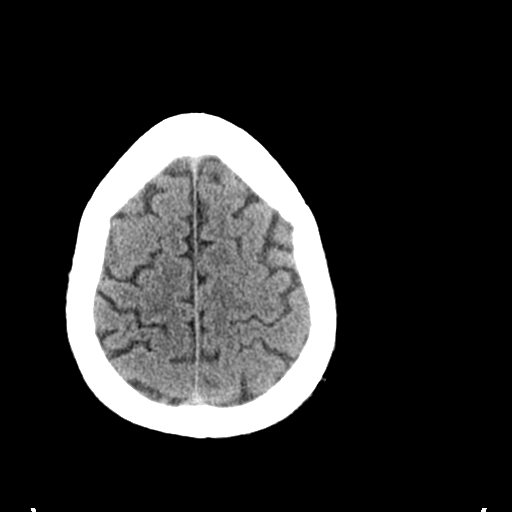
[im 28/31  brain]
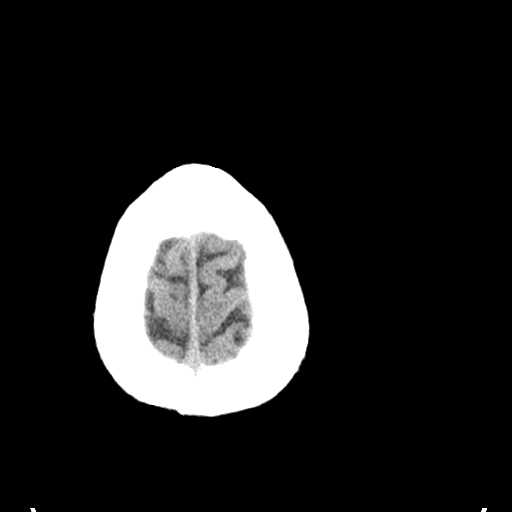
[im 28/31  bone]
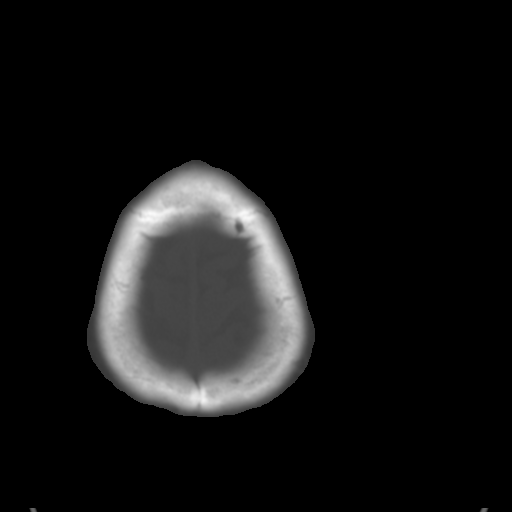

[Series 4: coronal soft tissue · coronal · 0.30mm/px · 3 of 65 slices shown]
[im 22/65  brain]
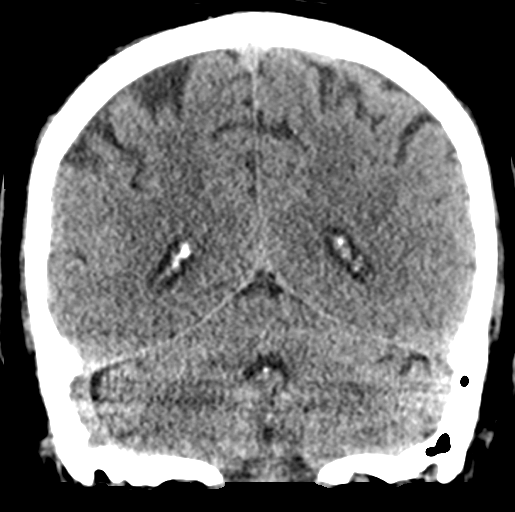
[im 29/65  brain]
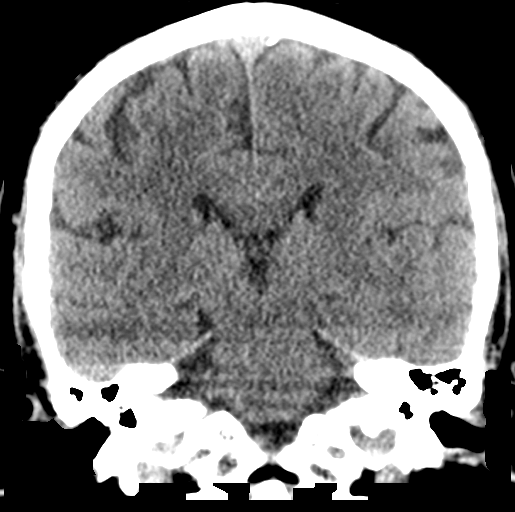
[im 36/65  brain]
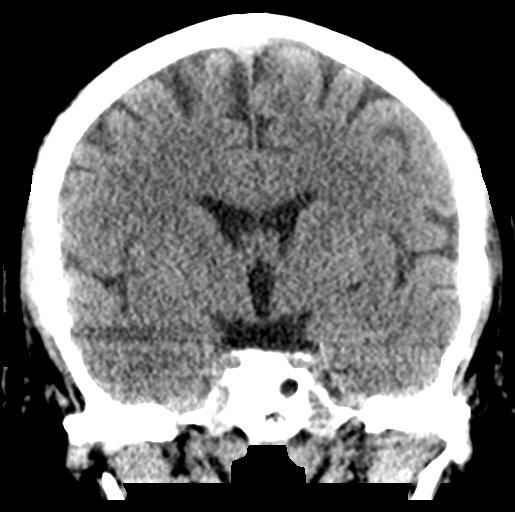

[Series 5: sagittal soft tissue · sagittal · 0.30mm/px · 3 of 51 slices shown]
[im 17/51  brain]
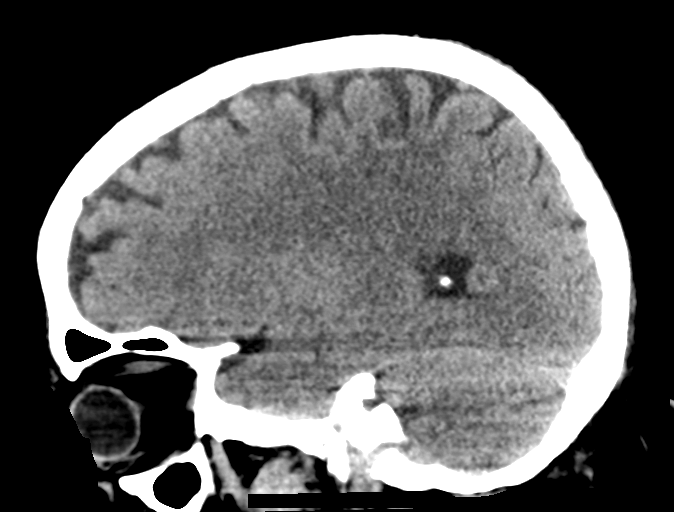
[im 26/51  brain]
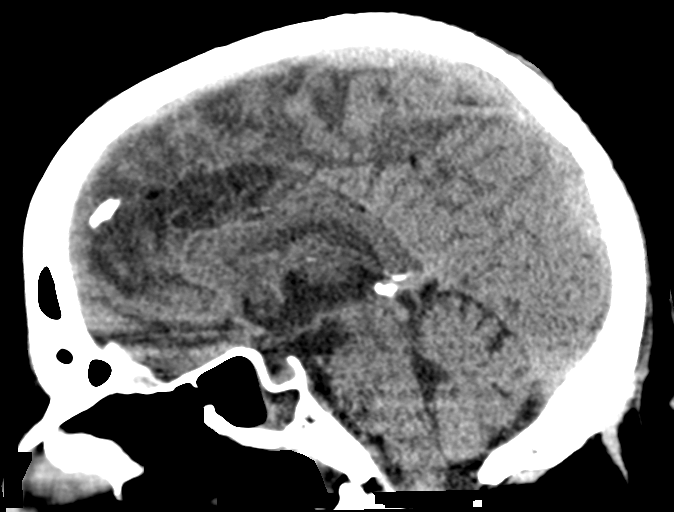
[im 34/51  brain]
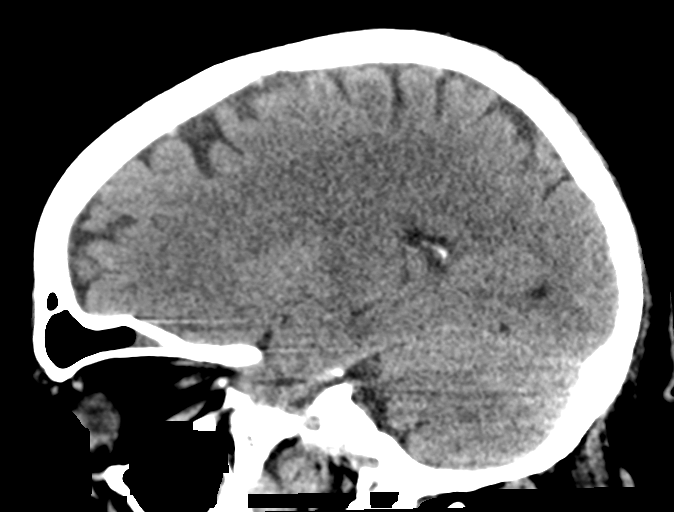

[15 of 47 positions shown; findings below may reference images not displayed]

FINDINGS: Brain: No evidence of acute infarction, hemorrhage, hydrocephalus,
extra-axial collection or mass lesion/mass effect.

Vascular: No hyperdense vessel or unexpected calcification.

Skull: Normal. Negative for fracture or focal lesion.

Sinuses/Orbits: Air-fluid level in the sphenoid sinus likely
sinusitis. Mucosal thickening in the paranasal sinuses otherwise.
Postoperative changes with partial left turbinate tectum ease and
resection of left ethmoid septations. Mastoid air cells are clear.

Other: None.
IMPRESSION: 1. No acute intracranial abnormalities.
2. Air-fluid level in the sphenoid sinus likely sinusitis.

## 2019-11-27 IMAGING — MR MR THORACIC SPINE WO/W CM
8 of 9 series · 33 of 48 positions shown · IV contrast (agent unspecified)
Comparison: None.

CLINICAL DATA: Severe headaches and total spine pain, history of
sinusitis with MRSA infections

EXAM:
MRI CERVICAL, THORACIC AND LUMBAR SPINE WITHOUT AND WITH CONTRAST
TECHNIQUE: Multiplanar and multiecho pulse sequences of the cervical spine, to
include the craniocervical junction and cervicothoracic junction,
and thoracic and lumbar spine, were obtained without and with
intravenous contrast.

[Series 56: T1 · sagittal · 4.0mm · 1.56mm/px · 1 of 11 slices shown (1 of 3)]
[im 1/11]
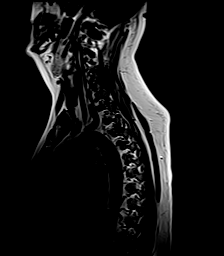

[Series 57: STIR · sagittal · 3.0mm · 0.94mm/px · 3 of 17 slices shown]
[im 1/17]
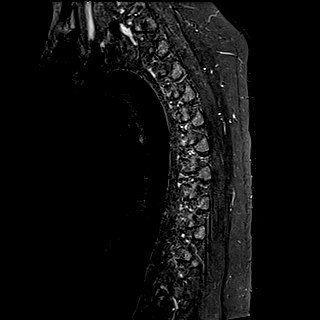
[im 9/17]
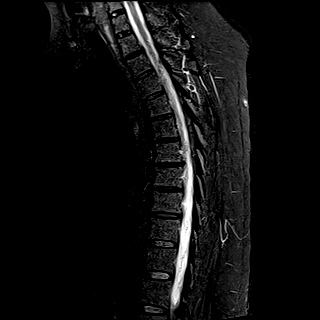
[im 17/17]
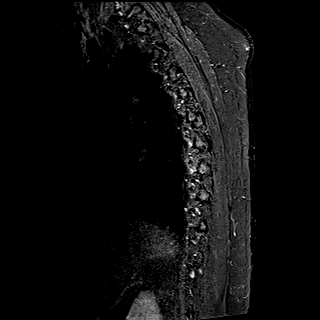

[Series 58: T1 · sagittal · 3.0mm · 1.00mm/px · 4 of 17 slices shown (2 of 3)]
[im 1/17]
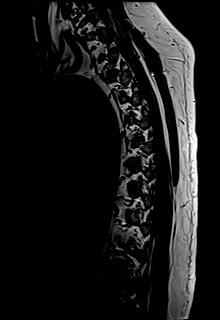
[im 6/17]
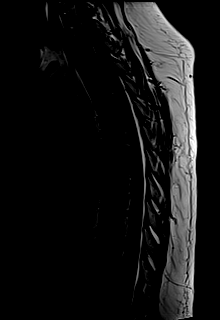
[im 11/17]
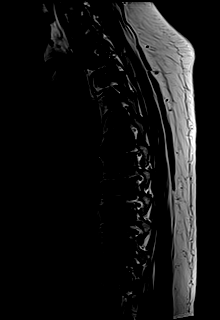
[im 17/17]
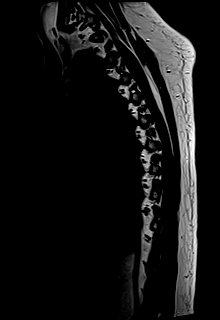

[Series 59: T2 · sagittal · 3.0mm · 0.83mm/px · 4 of 17 slices shown (1 of 2)]
[im 1/17]
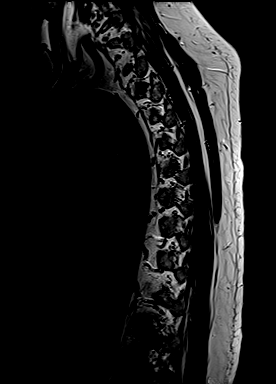
[im 6/17]
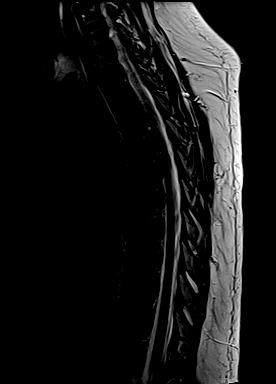
[im 11/17]
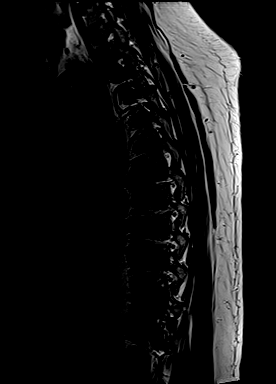
[im 17/17]
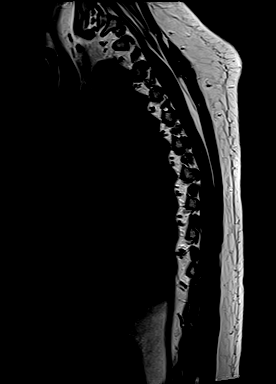

[Series 60: T2 · axial · 4.0mm · 0.78mm/px · z∈[-441,-227]mm · 8 of 36 slices shown (2 of 2)]
[im 1/36]
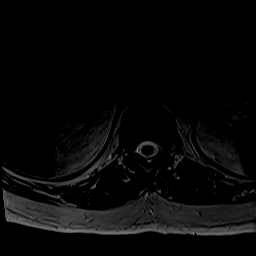
[im 6/36]
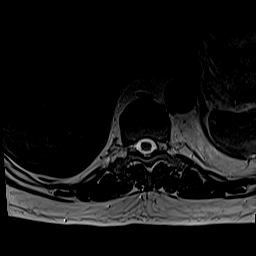
[im 11/36]
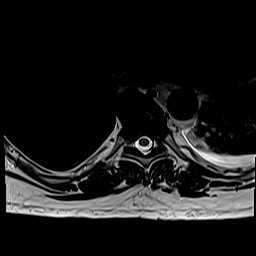
[im 16/36]
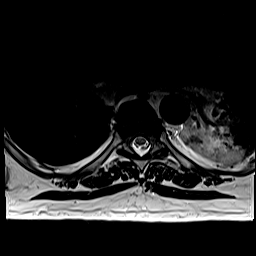
[im 21/36]
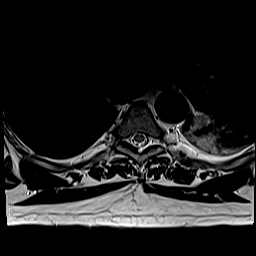
[im 26/36]
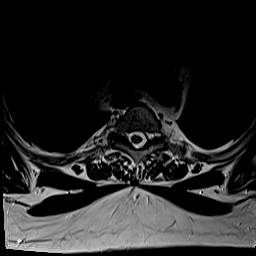
[im 31/36]
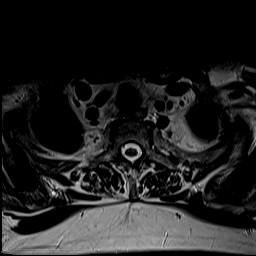
[im 36/36]
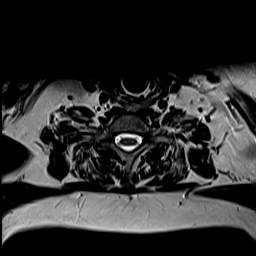

[Series 62: T1 · axial · 4.0mm · 0.39mm/px · z∈[-441,-227]mm · 8 of 36 slices shown (3 of 3)]
[im 1/36]
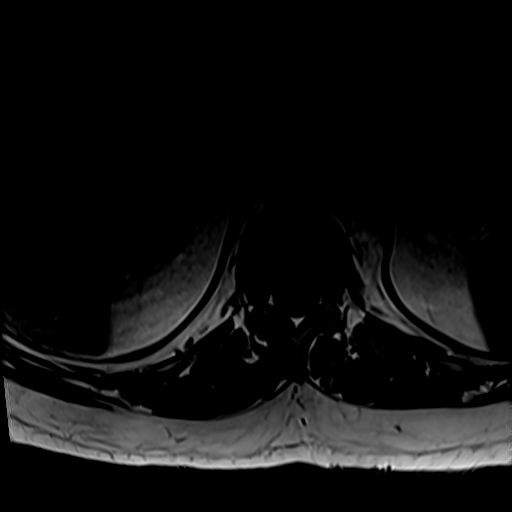
[im 6/36]
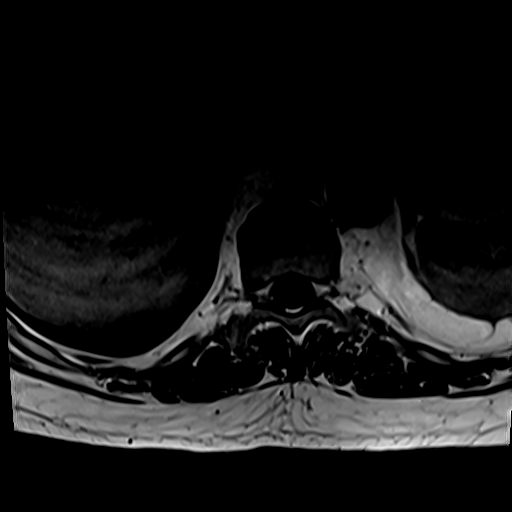
[im 11/36]
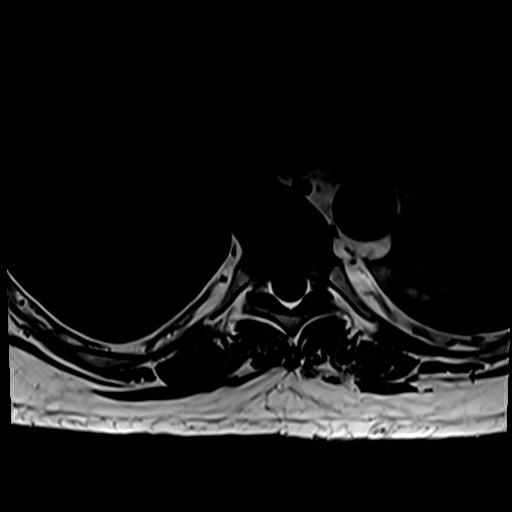
[im 16/36]
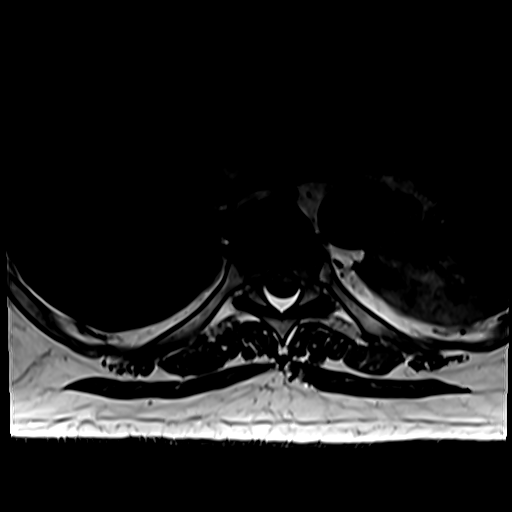
[im 21/36]
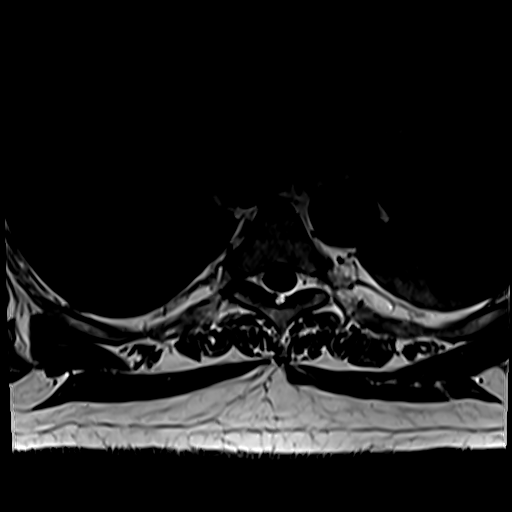
[im 26/36]
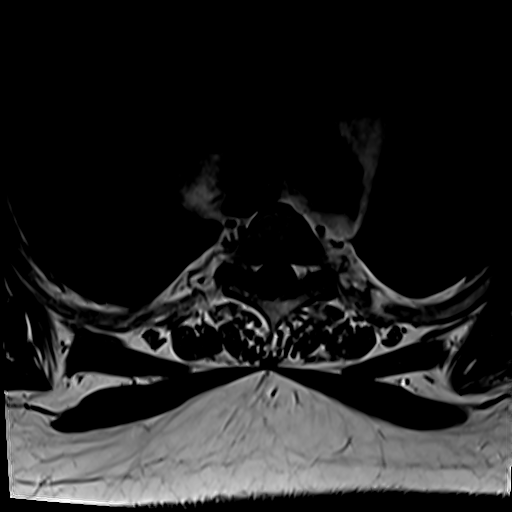
[im 31/36]
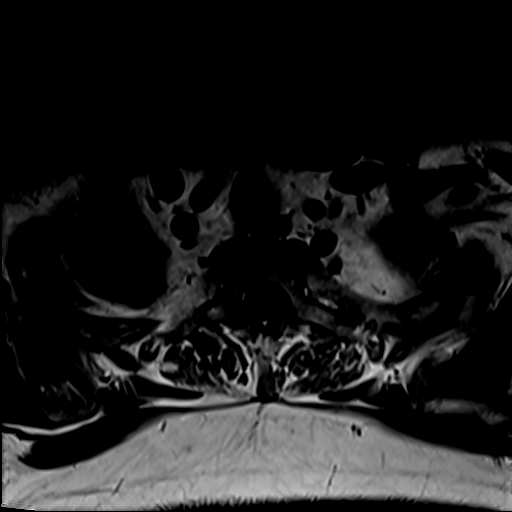
[im 36/36]
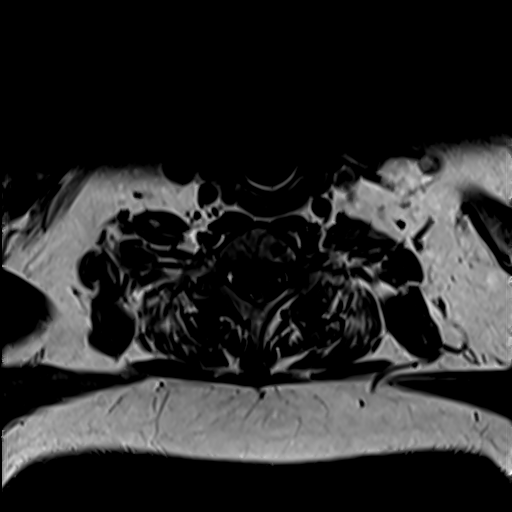

[Series 77: T1 post-contrast · axial · 4.0mm · 0.39mm/px · 1 of 36 slices shown]
[im 1/36]
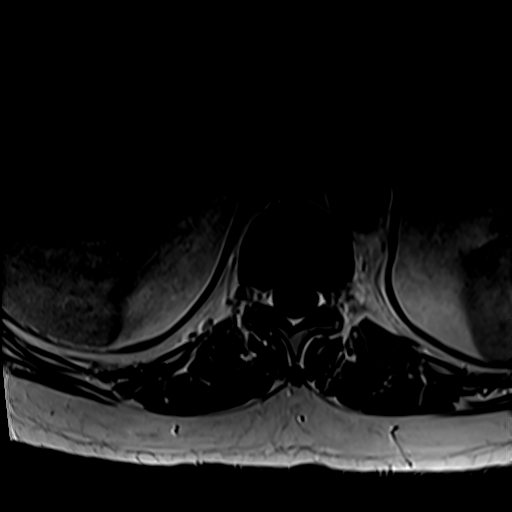

[Series 78: T1 fat-sat post-contrast · sagittal · 3.0mm · 1.00mm/px · 4 of 17 slices shown]
[im 1/17]
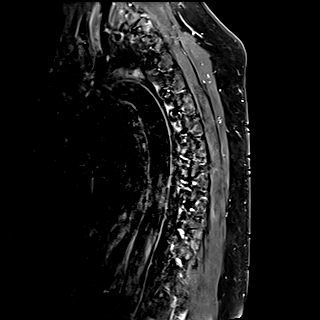
[im 6/17]
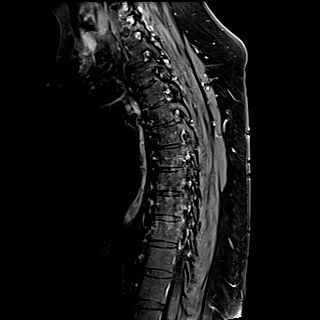
[im 11/17]
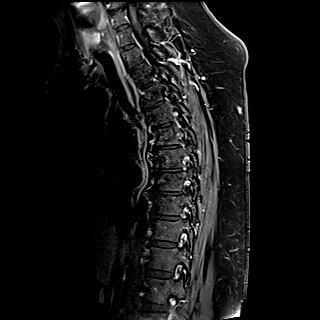
[im 17/17]
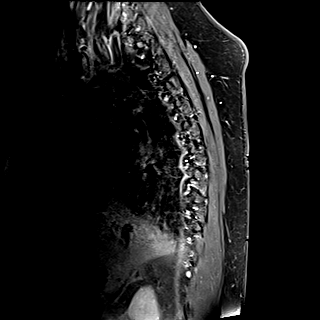

[33 of 48 positions shown; findings below may reference images not displayed]

FINDINGS: MRI CERVICAL SPINE

Alignment: Anteroposterior alignment is maintained.

Vertebrae: Vertebral body heights are preserved.

Cord: No abnormal signal.  No abnormal intrathecal enhancement.

Posterior Fossa, vertebral arteries, paraspinal tissues:
Unremarkable.

Disc levels:

C2-C3:  Mild facet hypertrophy.  No canal or foraminal stenosis.

C3-C4: Mild uncovertebral hypertrophy. Moderate left facet
hypertrophy. No canal or right foraminal stenosis. Mild left
foraminal stenosis.

C4-C5: Disc bulge with endplate osteophytes slightly eccentric to
the left. No canal or right foraminal stenosis. Mild to moderate
left foraminal stenosis.

C5-C6: Disc bulge with endplate osteophytes. Uncovertebral
hypertrophy. Mild canal stenosis. Mild foraminal stenosis.

C6-C7: Disc bulge with endplate osteophytes. No canal or right
foraminal stenosis. Minor left foraminal stenosis.

C7-T1:  No canal or foraminal stenosis.

MRI THORACIC SPINE

Alignment:  Anteroposterior alignment is maintained.

Vertebrae: There is no marrow edema.  No suspicious osseous lesion.

Cord: No abnormal cord signal. No abnormal intrathecal enhancement.

Paraspinal and other soft tissues: Left basilar
atelectasis/consolidation.

Disc levels: Minor degenerative disc disease is present. For
example, there is a small right paracentral disc protrusion at
T2-T3. There is facet hypertrophy, greatest at T3-T4 and T4-T5 on
the right.

MRI LUMBAR SPINE

Segmentation:  Standard.

Alignment:  Anteroposterior alignment is maintained.

Vertebrae: Vertebral body heights are preserved. There is no marrow
edema. There is no suspicious osseous lesion.

Conus medullaris and cauda equina: Conus extends to the L1 level.
Conus and cauda equina appear normal. No abnormal intrathecal
enhancement.

Paraspinal and other soft tissues: Unremarkable.

Disc levels: There is no significant canal stenosis at any level.
Marked left facet arthropathy with hypertrophic changes present at
L5-S1. Results in mild narrowing of the left L5-S1 foramen.
IMPRESSION: No significant abnormality identified. Mild degenerative changes are
present.

## 2019-11-27 IMAGING — MR MR LUMBAR SPINE WO/W CM
6 of 7 series · 31 of 48 positions shown · IV contrast (agent unspecified)
Comparison: None.

CLINICAL DATA: Severe headaches and total spine pain, history of
sinusitis with MRSA infections

EXAM:
MRI CERVICAL, THORACIC AND LUMBAR SPINE WITHOUT AND WITH CONTRAST
TECHNIQUE: Multiplanar and multiecho pulse sequences of the cervical spine, to
include the craniocervical junction and cervicothoracic junction,
and thoracic and lumbar spine, were obtained without and with
intravenous contrast.

[Series 67: T1 · sagittal · 4.0mm · 0.75mm/px · 4 of 17 slices shown (1 of 2)]
[im 1/17]
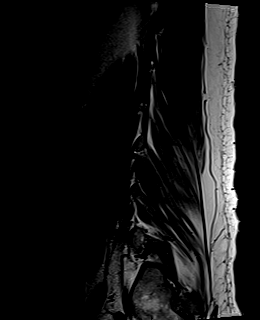
[im 6/17]
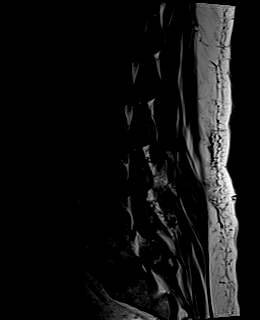
[im 11/17]
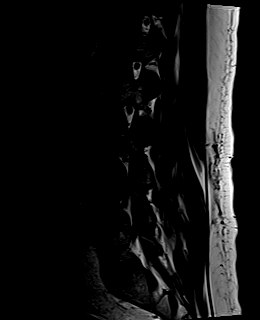
[im 17/17]
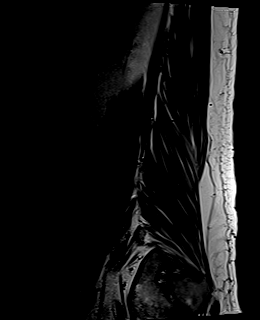

[Series 68: T2 · sagittal · 4.0mm · 0.81mm/px · 4 of 17 slices shown (1 of 2)]
[im 1/17]
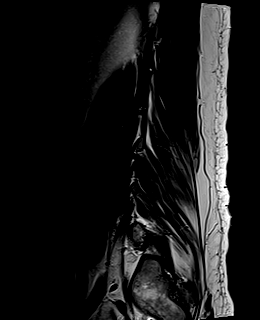
[im 6/17]
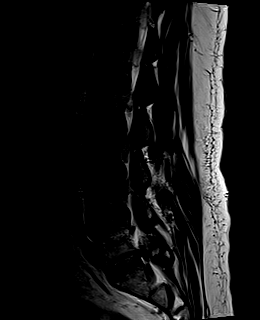
[im 11/17]
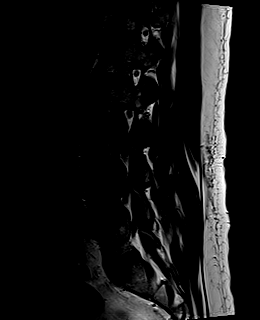
[im 17/17]
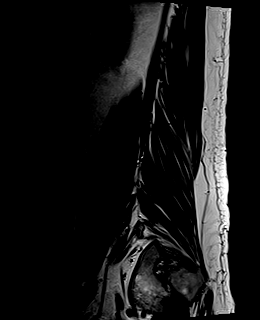

[Series 69: STIR · sagittal · 4.0mm · 0.51mm/px · 2 of 17 slices shown]
[im 1/17]
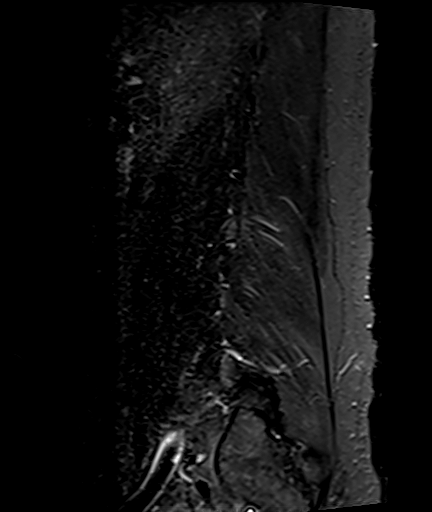
[im 5/17]
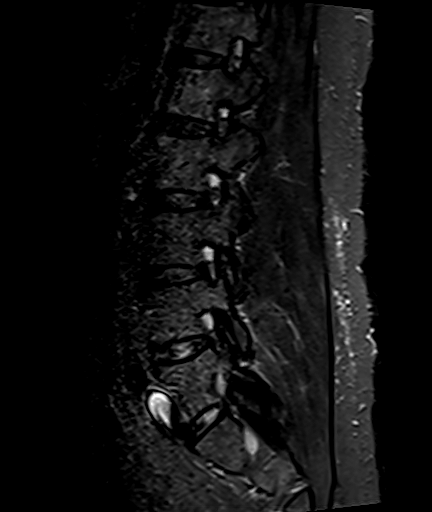

[Series 70: T2 · axial · 4.0mm · 0.62mm/px · z∈[-724,-519]mm · 8 of 37 slices shown (2 of 2)]
[im 1/37]
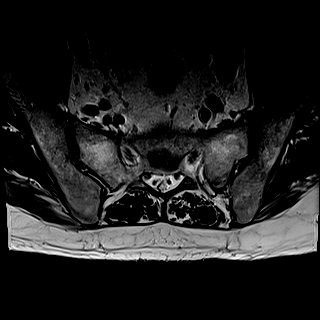
[im 5/37]
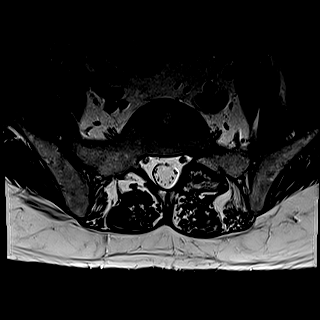
[im 13/37]
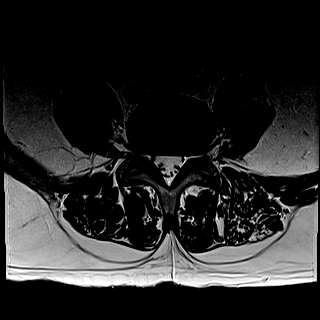
[im 17/37]
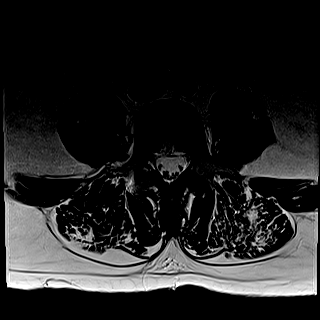
[im 21/37]
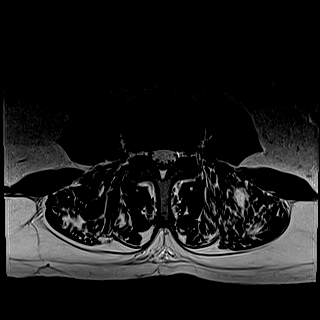
[im 25/37]
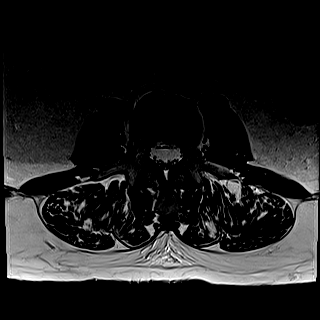
[im 33/37]
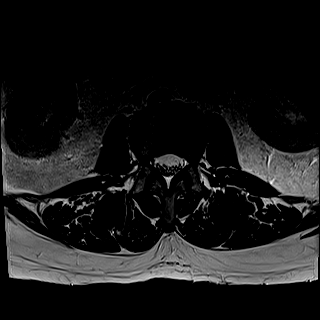
[im 37/37]
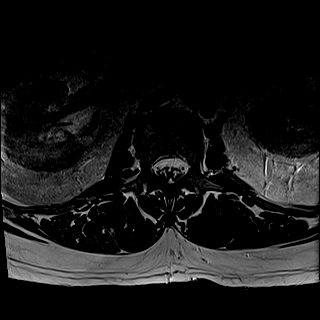

[Series 71: T1 · axial · 4.0mm · 0.39mm/px · z∈[-724,-519]mm · 8 of 37 slices shown (2 of 2)]
[im 1/37]
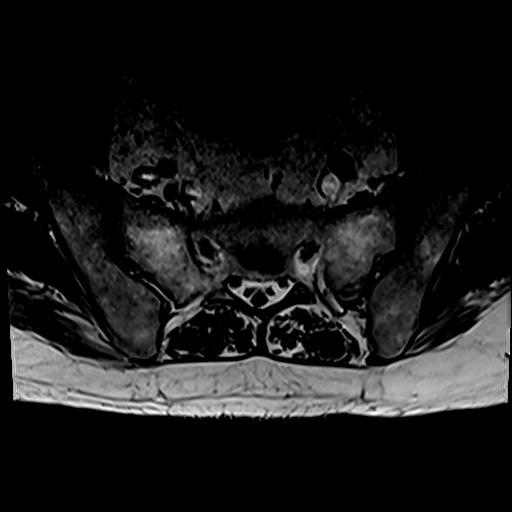
[im 5/37]
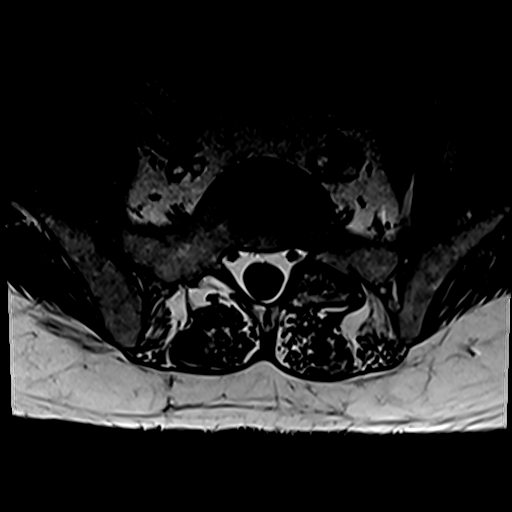
[im 13/37]
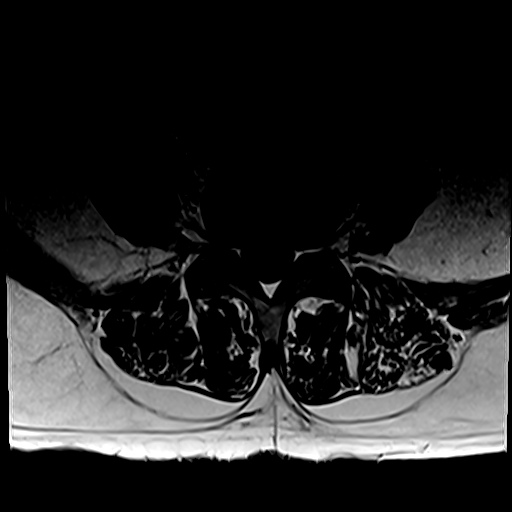
[im 17/37]
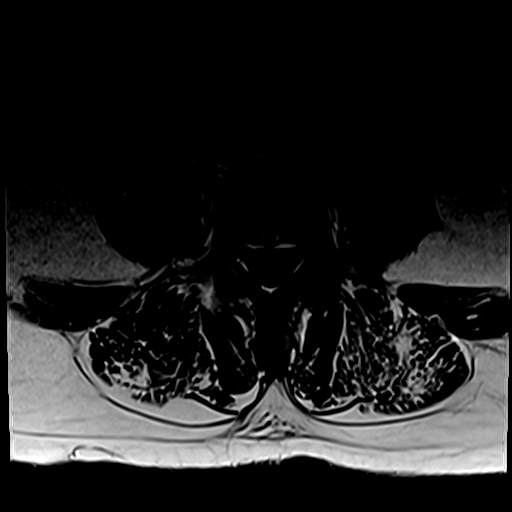
[im 21/37]
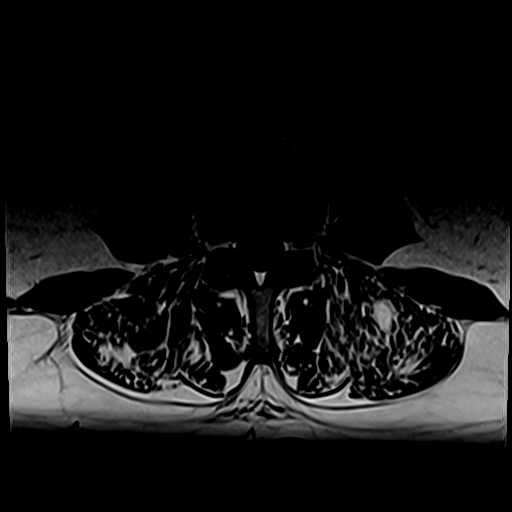
[im 25/37]
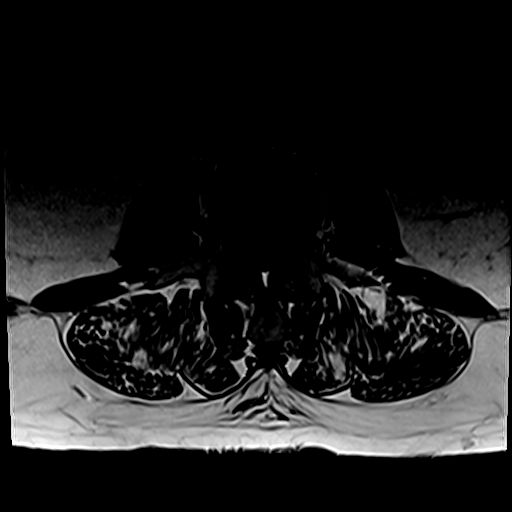
[im 33/37]
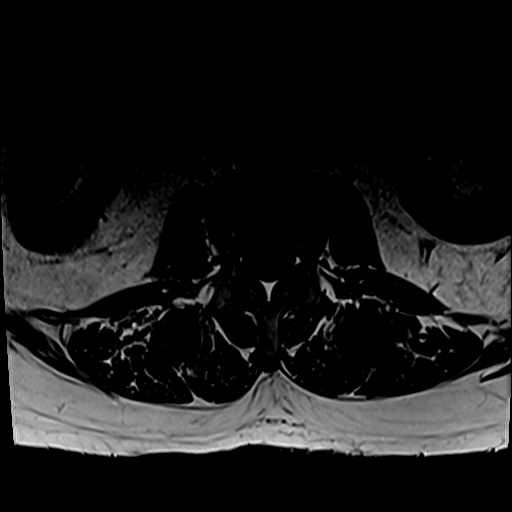
[im 37/37]
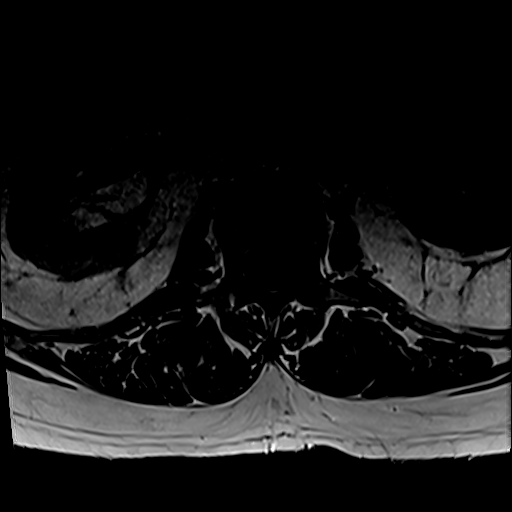

[Series 80: T1 fat-sat post-contrast · sagittal · 4.0mm · 0.81mm/px · 5 of 17 slices shown]
[im 1/17]
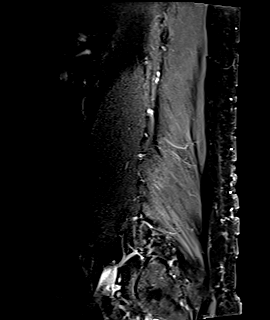
[im 5/17]
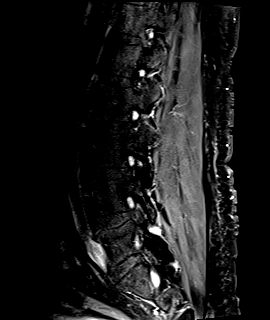
[im 9/17]
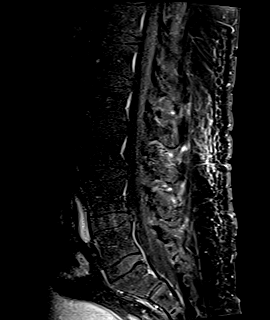
[im 13/17]
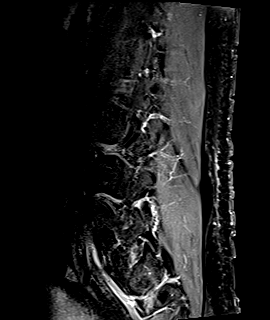
[im 17/17]
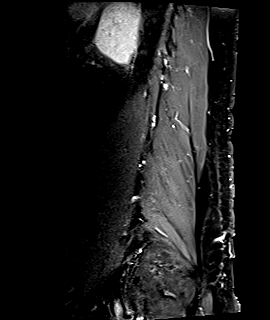

[31 of 48 positions shown; findings below may reference images not displayed]

FINDINGS: MRI CERVICAL SPINE

Alignment: Anteroposterior alignment is maintained.

Vertebrae: Vertebral body heights are preserved.

Cord: No abnormal signal.  No abnormal intrathecal enhancement.

Posterior Fossa, vertebral arteries, paraspinal tissues:
Unremarkable.

Disc levels:

C2-C3:  Mild facet hypertrophy.  No canal or foraminal stenosis.

C3-C4: Mild uncovertebral hypertrophy. Moderate left facet
hypertrophy. No canal or right foraminal stenosis. Mild left
foraminal stenosis.

C4-C5: Disc bulge with endplate osteophytes slightly eccentric to
the left. No canal or right foraminal stenosis. Mild to moderate
left foraminal stenosis.

C5-C6: Disc bulge with endplate osteophytes. Uncovertebral
hypertrophy. Mild canal stenosis. Mild foraminal stenosis.

C6-C7: Disc bulge with endplate osteophytes. No canal or right
foraminal stenosis. Minor left foraminal stenosis.

C7-T1:  No canal or foraminal stenosis.

MRI THORACIC SPINE

Alignment:  Anteroposterior alignment is maintained.

Vertebrae: There is no marrow edema.  No suspicious osseous lesion.

Cord: No abnormal cord signal. No abnormal intrathecal enhancement.

Paraspinal and other soft tissues: Left basilar
atelectasis/consolidation.

Disc levels: Minor degenerative disc disease is present. For
example, there is a small right paracentral disc protrusion at
T2-T3. There is facet hypertrophy, greatest at T3-T4 and T4-T5 on
the right.

MRI LUMBAR SPINE

Segmentation:  Standard.

Alignment:  Anteroposterior alignment is maintained.

Vertebrae: Vertebral body heights are preserved. There is no marrow
edema. There is no suspicious osseous lesion.

Conus medullaris and cauda equina: Conus extends to the L1 level.
Conus and cauda equina appear normal. No abnormal intrathecal
enhancement.

Paraspinal and other soft tissues: Unremarkable.

Disc levels: There is no significant canal stenosis at any level.
Marked left facet arthropathy with hypertrophic changes present at
L5-S1. Results in mild narrowing of the left L5-S1 foramen.
IMPRESSION: No significant abnormality identified. Mild degenerative changes are
present.

## 2019-11-27 IMAGING — MR MR HEAD WO/W CM
11 series · 48 of 48 positions shown · IV contrast (gadavist)
Comparison: [DATE] head CT.

CLINICAL DATA: Sinusitis, headaches.

EXAM:
MRI HEAD WITHOUT AND WITH CONTRAST
TECHNIQUE: Multiplanar, multiecho pulse sequences of the brain and surrounding
structures were obtained without and with intravenous contrast.
CONTRAST:  9mL GADAVIST GADOBUTROL 1 MMOL/ML IV SOLN

[Series 5: DWI · axial · 3.0mm · 1.36mm/px · z∈[-68,+84]mm · 11 of 108 slices shown (1 of 4)]
[im 1/108]
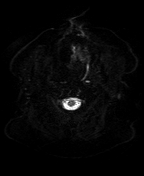
[im 11/108]
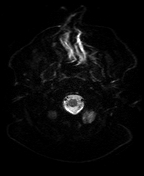
[im 22/108]
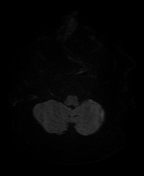
[im 33/108]
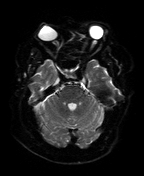
[im 43/108]
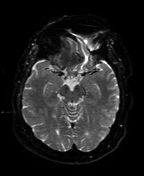
[im 54/108]
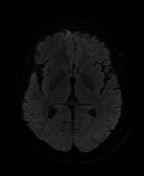
[im 65/108]
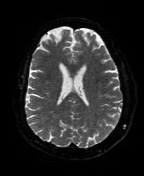
[im 75/108]
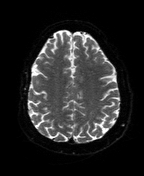
[im 86/108]
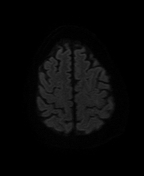
[im 97/108]
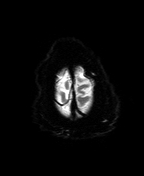
[im 108/108]
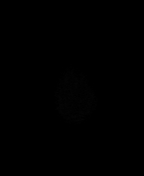

[Series 6: DWI · axial · 3.0mm · 1.36mm/px · z∈[-68,+84]mm · 5 of 54 slices shown (2 of 4)]
[im 1/54]
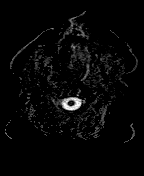
[im 14/54]
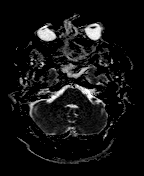
[im 27/54]
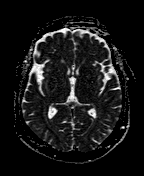
[im 40/54]
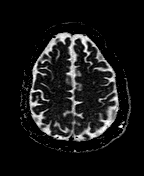
[im 54/54]
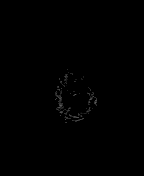

[Series 7: T1 · sagittal · 5.0mm · 0.75mm/px · 2 of 25 slices shown (1 of 2)]
[im 1/25]
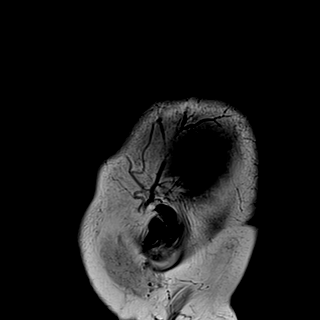
[im 25/25]
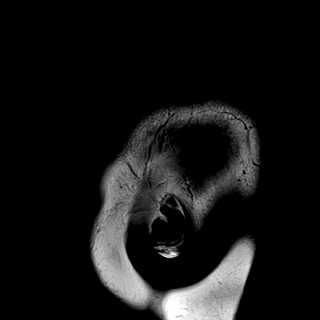

[Series 8: T2 · axial · 5.0mm · 0.62mm/px · z∈[-66,+82]mm · 2 of 25 slices shown]
[im 1/25]
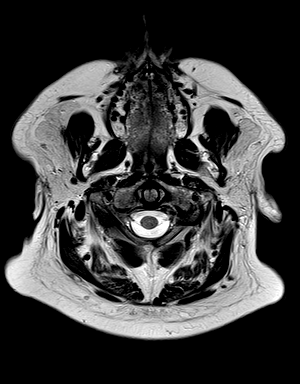
[im 25/25]
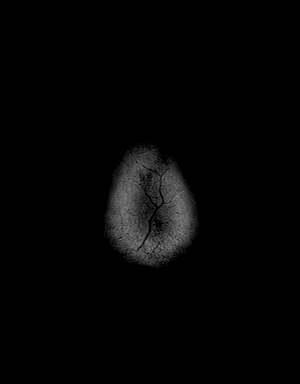

[Series 11: FLAIR · axial · 3.0mm · 0.75mm/px · z∈[-65,+81]mm · 5 of 52 slices shown]
[im 1/52]
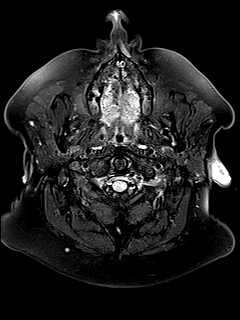
[im 13/52]
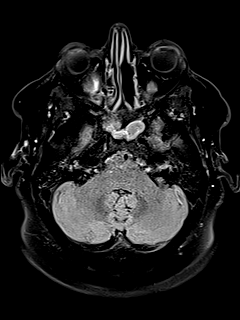
[im 26/52]
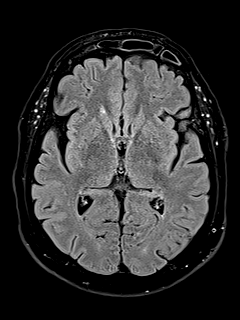
[im 39/52]
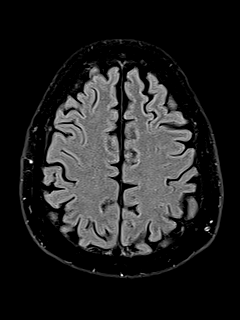
[im 52/52]
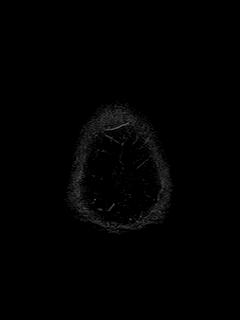

[Series 12: T1 · axial · 4.0mm · 0.45mm/px · z∈[-68,+81]mm · 3 of 31 slices shown (2 of 2)]
[im 1/31]
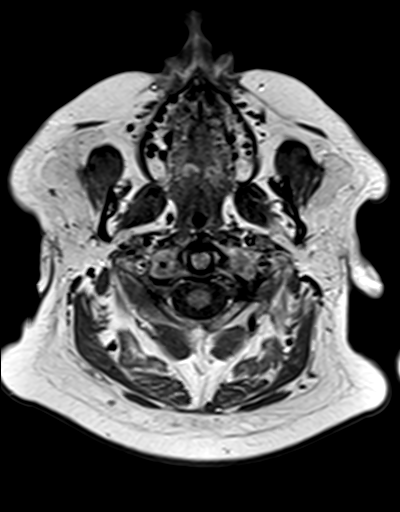
[im 16/31]
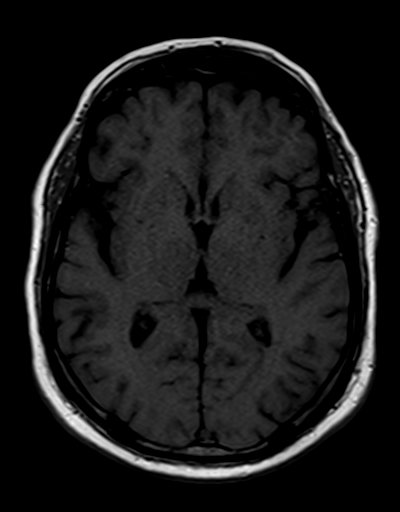
[im 31/31]
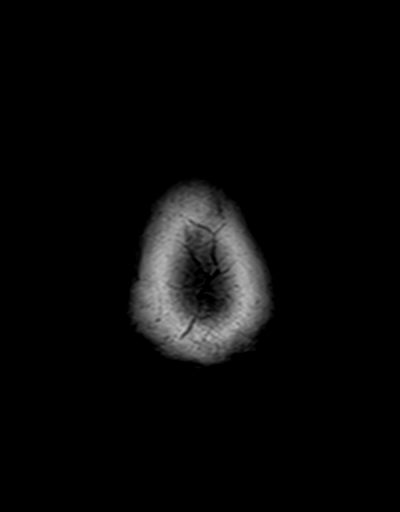

[Series 13: mip_images(sw) · axial · 32.0mm · 0.75mm/px · z∈[-60,+77]mm · 4 of 37 slices shown]
[im 1/37]
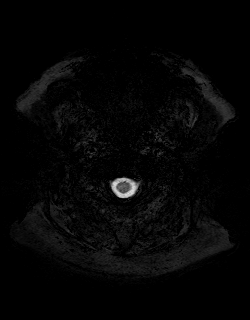
[im 13/37]
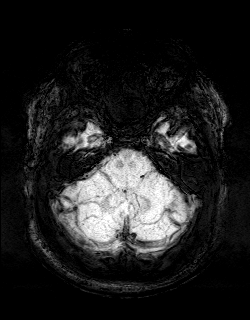
[im 25/37]
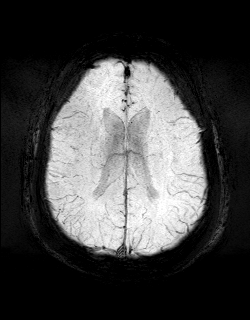
[im 37/37]
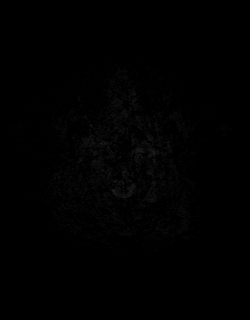

[Series 15: DWI · coronal · 5.0mm · 1.31mm/px · 7 of 76 slices shown (3 of 4)]
[im 1/76]
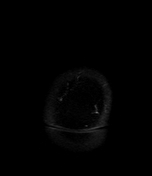
[im 13/76]
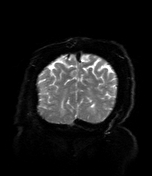
[im 26/76]
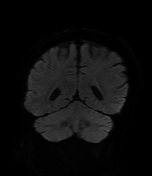
[im 38/76]
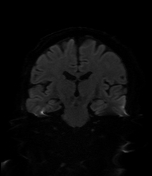
[im 51/76]
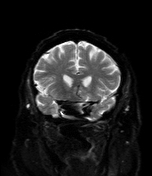
[im 63/76]
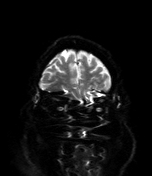
[im 76/76]
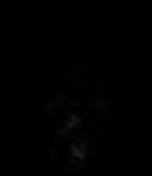

[Series 16: DWI · coronal · 5.0mm · 1.31mm/px · 4 of 38 slices shown (4 of 4)]
[im 1/38]
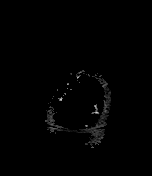
[im 13/38]
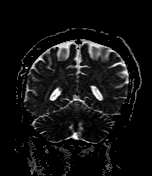
[im 25/38]
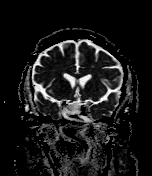
[im 38/38]
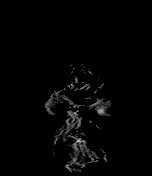

[Series 72: T1 post-contrast · axial · 4.0mm · 0.45mm/px · z∈[-68,+81]mm · 3 of 31 slices shown (1 of 2)]
[im 1/31]
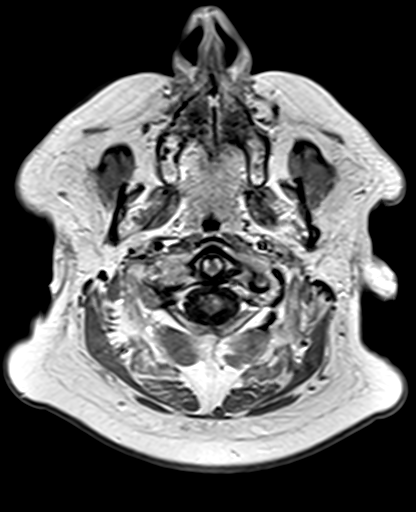
[im 16/31]
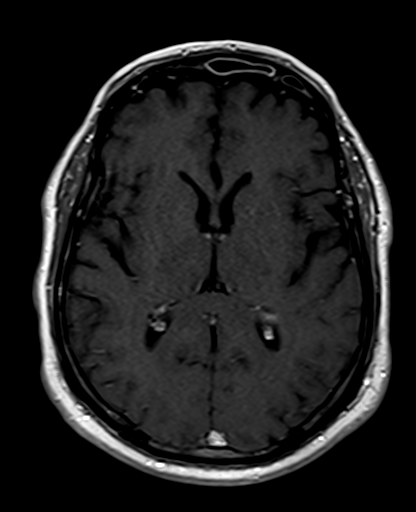
[im 31/31]
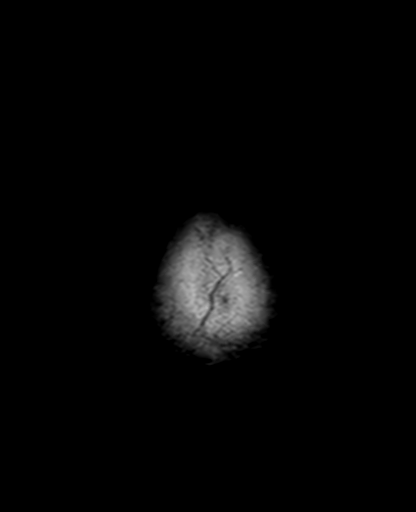

[Series 74: T1 post-contrast · sagittal · 5.0mm · 0.75mm/px · 2 of 25 slices shown (2 of 2)]
[im 1/25]
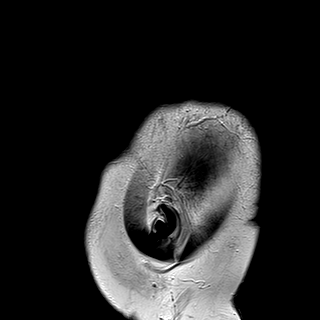
[im 25/25]
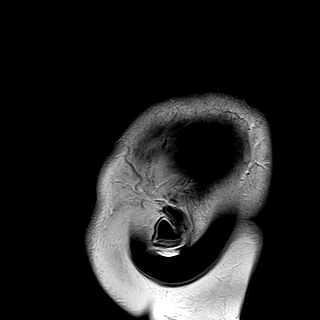

[48 of 48 positions shown; findings below may reference images not displayed]

FINDINGS: Brain: Sequela of tiny remote anterior left centrum semiovale
insult. No intracranial hemorrhage. No diffusion-weighted signal
abnormality. No midline shift, ventriculomegaly or extra-axial fluid
collection. No mass lesion. No abnormal enhancement.

Vascular: Normal flow voids.

Skull and upper cervical spine: Normal marrow signal. No abnormal
enhancement.

Sinuses/Orbits: Normal orbits. Postsurgical appearance of the
paranasal sinuses. There is moderate mucosal thickening involving
the left frontal, ethmoid and sphenoid sinuses. Minimal to mild
mucosal thickening involving the right frontal and bilateral
maxillary sinuses. Layering left frontal and sphenoid sinus
secretions likely reflect active inflammation. Trace right mastoid
effusion.

Other: None.
IMPRESSION: 1. No acute intracranial abnormality.
2. Sequela of tiny remote anterior left centrum semiovale insult.
3. Postsurgical appearance of the paranasal sinuses. Pansinus
disease with layering left frontal and sphenoid sinus secretions
reflecting active inflammation. No evidence of orbital or
intracranial involvement.
4. Trace right mastoid effusion.

## 2019-11-27 MED ORDER — ONDANSETRON HCL 4 MG/2ML IJ SOLN
4.0000 mg | Freq: Four times a day (QID) | INTRAMUSCULAR | Status: DC | PRN
  Administered 2019-11-27 – 2019-11-28 (×3): 4 mg via INTRAVENOUS
  Filled 2019-11-27 (×3): qty 2

## 2019-11-27 MED ORDER — ATORVASTATIN CALCIUM 40 MG PO TABS
80.0000 mg | ORAL_TABLET | Freq: Every day | ORAL | Status: DC
  Administered 2019-11-27 – 2019-12-02 (×6): 80 mg via ORAL
  Filled 2019-11-27 (×6): qty 2

## 2019-11-27 MED ORDER — ACETAMINOPHEN 325 MG PO TABS
650.0000 mg | ORAL_TABLET | Freq: Once | ORAL | Status: AC
  Administered 2019-11-27: 650 mg via ORAL
  Filled 2019-11-27: qty 2

## 2019-11-27 MED ORDER — PANTOPRAZOLE SODIUM 40 MG PO TBEC
40.0000 mg | DELAYED_RELEASE_TABLET | Freq: Once | ORAL | Status: AC
  Administered 2019-11-27: 40 mg via ORAL
  Filled 2019-11-27: qty 1

## 2019-11-27 MED ORDER — METHOCARBAMOL 1000 MG/10ML IJ SOLN
500.0000 mg | Freq: Once | INTRAVENOUS | Status: AC
  Administered 2019-11-27: 500 mg via INTRAVENOUS
  Filled 2019-11-27: qty 500

## 2019-11-27 MED ORDER — MONTELUKAST SODIUM 10 MG PO TABS
10.0000 mg | ORAL_TABLET | Freq: Every day | ORAL | Status: DC
  Administered 2019-11-27 – 2019-12-02 (×6): 10 mg via ORAL
  Filled 2019-11-27 (×6): qty 1

## 2019-11-27 MED ORDER — ONDANSETRON HCL 4 MG PO TABS: 4.0000 mg | ORAL_TABLET | Freq: Four times a day (QID) | ORAL | Status: DC | PRN

## 2019-11-27 MED ORDER — GADOBUTROL 1 MMOL/ML IV SOLN
9.0000 mL | Freq: Once | INTRAVENOUS | Status: AC | PRN
  Administered 2019-11-27: 9 mL via INTRAVENOUS

## 2019-11-27 MED ORDER — NORTRIPTYLINE HCL 25 MG PO CAPS
25.0000 mg | ORAL_CAPSULE | Freq: Every day | ORAL | Status: DC
  Administered 2019-11-27 – 2019-12-01 (×5): 25 mg via ORAL
  Filled 2019-11-27 (×6): qty 1

## 2019-11-27 MED ORDER — UMECLIDINIUM BROMIDE 62.5 MCG/INH IN AEPB
1.0000 | INHALATION_SPRAY | Freq: Every day | RESPIRATORY_TRACT | Status: DC
  Administered 2019-11-27 – 2019-12-02 (×6): 1 via RESPIRATORY_TRACT
  Filled 2019-11-27: qty 7

## 2019-11-27 MED ORDER — PANTOPRAZOLE SODIUM 40 MG PO TBEC
40.0000 mg | DELAYED_RELEASE_TABLET | Freq: Every day | ORAL | Status: DC
  Administered 2019-11-27 – 2019-11-28 (×2): 40 mg via ORAL
  Filled 2019-11-27 (×2): qty 1

## 2019-11-27 MED ORDER — SENNA 8.6 MG PO TABS
1.0000 | ORAL_TABLET | Freq: Two times a day (BID) | ORAL | Status: DC
  Administered 2019-11-27 – 2019-12-01 (×10): 8.6 mg via ORAL
  Filled 2019-11-27 (×11): qty 1

## 2019-11-27 MED ORDER — AMOXICILLIN-POT CLAVULANATE 875-125 MG PO TABS
1.0000 | ORAL_TABLET | Freq: Two times a day (BID) | ORAL | Status: DC
  Administered 2019-11-27 – 2019-11-28 (×3): 1 via ORAL
  Filled 2019-11-27 (×3): qty 1

## 2019-11-27 MED ORDER — INSULIN ASPART 100 UNIT/ML ~~LOC~~ SOLN
0.0000 [IU] | Freq: Three times a day (TID) | SUBCUTANEOUS | Status: DC
  Administered 2019-11-28: 3 [IU] via SUBCUTANEOUS
  Administered 2019-11-29: 2 [IU] via SUBCUTANEOUS
  Administered 2019-11-29: 3 [IU] via SUBCUTANEOUS
  Administered 2019-11-29: 2 [IU] via SUBCUTANEOUS
  Administered 2019-11-30: 5 [IU] via SUBCUTANEOUS
  Administered 2019-12-01 – 2019-12-02 (×2): 3 [IU] via SUBCUTANEOUS
  Administered 2019-12-02: 2 [IU] via SUBCUTANEOUS
  Filled 2019-11-27: qty 0.15

## 2019-11-27 MED ORDER — LACTATED RINGERS IV BOLUS
500.0000 mL | Freq: Once | INTRAVENOUS | Status: AC
  Administered 2019-11-27: 500 mL via INTRAVENOUS

## 2019-11-27 MED ORDER — SODIUM CHLORIDE 0.9 % IV SOLN: INTRAVENOUS | Status: AC

## 2019-11-27 MED ORDER — ACETAMINOPHEN 650 MG RE SUPP: 650.0000 mg | Freq: Four times a day (QID) | RECTAL | Status: DC | PRN

## 2019-11-27 MED ORDER — FLUTICASONE-UMECLIDIN-VILANT 200-62.5-25 MCG/INH IN AEPB: 1.0000 | INHALATION_SPRAY | Freq: Every day | RESPIRATORY_TRACT | Status: DC

## 2019-11-27 MED ORDER — ALBUTEROL SULFATE (2.5 MG/3ML) 0.083% IN NEBU: 2.5000 mg | INHALATION_SOLUTION | RESPIRATORY_TRACT | Status: DC | PRN

## 2019-11-27 MED ORDER — FLUTICASONE PROPIONATE 50 MCG/ACT NA SUSP
1.0000 | Freq: Two times a day (BID) | NASAL | Status: DC
  Administered 2019-11-27 – 2019-12-02 (×11): 1 via NASAL
  Filled 2019-11-27 (×2): qty 16

## 2019-11-27 MED ORDER — OXYCODONE HCL 5 MG PO TABS
10.0000 mg | ORAL_TABLET | Freq: Every day | ORAL | Status: DC | PRN
  Administered 2019-11-27 – 2019-12-02 (×17): 10 mg via ORAL
  Filled 2019-11-27 (×18): qty 2

## 2019-11-27 MED ORDER — ACETAMINOPHEN 325 MG PO TABS: 650.0000 mg | ORAL_TABLET | Freq: Four times a day (QID) | ORAL | Status: DC | PRN

## 2019-11-27 MED ORDER — INSULIN ASPART 100 UNIT/ML ~~LOC~~ SOLN
0.0000 [IU] | Freq: Every day | SUBCUTANEOUS | Status: DC
  Administered 2019-11-29: 0 [IU] via SUBCUTANEOUS
  Administered 2019-12-01: 2 [IU] via SUBCUTANEOUS
  Filled 2019-11-27: qty 0.05

## 2019-11-27 MED ORDER — METHOCARBAMOL 1000 MG/10ML IJ SOLN
500.0000 mg | Freq: Four times a day (QID) | INTRAVENOUS | Status: DC | PRN
  Administered 2019-11-27: 500 mg via INTRAVENOUS
  Filled 2019-11-27: qty 500
  Filled 2019-11-27: qty 5

## 2019-11-27 MED ORDER — SODIUM CHLORIDE 0.9 % IV SOLN: 250.0000 mL | INTRAVENOUS | Status: DC | PRN

## 2019-11-27 MED ORDER — FLUTICASONE FUROATE-VILANTEROL 200-25 MCG/INH IN AEPB
1.0000 | INHALATION_SPRAY | Freq: Every day | RESPIRATORY_TRACT | Status: DC
  Administered 2019-11-27 – 2019-12-02 (×6): 1 via RESPIRATORY_TRACT
  Filled 2019-11-27: qty 28

## 2019-11-27 MED ORDER — SODIUM CHLORIDE 0.9% FLUSH
3.0000 mL | Freq: Two times a day (BID) | INTRAVENOUS | Status: DC
  Administered 2019-11-29 – 2019-12-02 (×4): 3 mL via INTRAVENOUS

## 2019-11-27 MED ORDER — ONDANSETRON HCL 4 MG/2ML IJ SOLN
4.0000 mg | Freq: Once | INTRAMUSCULAR | Status: AC
  Administered 2019-11-27: 4 mg via INTRAVENOUS
  Filled 2019-11-27: qty 2

## 2019-11-27 MED ORDER — GABAPENTIN 300 MG PO CAPS
1200.0000 mg | ORAL_CAPSULE | Freq: Two times a day (BID) | ORAL | Status: DC
  Administered 2019-11-27 – 2019-12-02 (×11): 1200 mg via ORAL
  Filled 2019-11-27 (×11): qty 4

## 2019-11-27 MED ORDER — SODIUM CHLORIDE 0.9% FLUSH: 3.0000 mL | INTRAVENOUS | Status: DC | PRN

## 2019-11-27 NOTE — ED Notes (Signed)
Patient transported to CT 

## 2019-11-27 NOTE — ED Notes (Signed)
Reports attempt x1

## 2019-11-27 NOTE — H&P (Addendum)
[facility] History and Physical  Bryan Lopez JSE:[id number] DOB: 1961/02/22 DOA: 11/26/2019   PCP: Patient, [name]  Specialists: Followed by allergy specialist  Chief Complaint: Back pain ongoing for about 2 months but worse over the last 24 hours  HPI: Bryan Lopez is a 59 y.o. male with a past medical history of diabetes mellitus type 2 on insulin, history of sinusitis, history of asthma, who apparently recently moved here from [place].  He has been lifting a lot of heavy objects over the past couple of months and has noticed some back pain.  Yesterday he lifted some heavy boxes and experienced severe pain in his back.  Transiently he felt some numbness in both his fingers.  Currently he is experiencing severe back pain in his left lower back radiating down to his left leg.  Denies any red flag symptoms such as bowel or bladder incontinence.  He mentions that he is also experienced some chills at home and felt feverish although he did not check his temperature.  Denies any weakness per se in his legs.  Denies any shortness of breath cough.  He is unvaccinated for Covid.  Denies any dysuria.  No diarrhea.  He did travel to [place] last week.  Currently the pain in the back is about 8 out of 10 in intensity.  He is also nauseated.  In the emergency department patient underwent plain films of his back which did not show any acute findings.  Due to fever, elevated CRP, MRI has been ordered and is still pending.  Home Medications: Prior to Admission medications   Medication Sig Start Date End Date Taking? Authorizing Provider  albuterol (VENTOLIN HFA) 108 (90 Base) MCG/ACT inhaler Inhale 1-2 puffs into the lungs every 4 (four) hours as needed for wheezing.  04/11/19  Yes [provider]  atorvastatin (LIPITOR) 80 MG tablet Take 80 mg by mouth daily. 09/26/19  Yes [provider]  DUPIXENT 300 MG/2ML prefilled syringe Inject 300 mg into the skin every 14 (fourteen) days.   07/30/19  Yes [provider]  EPINEPHrine 0.3 mg/0.3 mL IJ SOAJ injection Inject 0.3 mg into the muscle as needed for anaphylaxis.  08/13/19  Yes [provider]  fentaNYL (DURAGESIC) 50 MCG/HR Place 1 patch onto the skin every three (3) days as needed (pain).  09/29/19  Yes [provider]  fluticasone (FLONASE) 50 MCG/ACT nasal spray Place 1 spray into both nostrils 2 (two) times daily. 09/28/19  Yes [provider]  gabapentin (NEURONTIN) 600 MG tablet Take 1,200 mg by mouth 2 (two) times daily.  09/29/19  Yes [provider]  JARDIANCE 25 MG TABS tablet Take 25 mg by mouth daily. 07/20/19  Yes [provider]  montelukast (SINGULAIR) 10 MG tablet Take 10 mg by mouth daily. 06/10/19  Yes [provider]  nortriptyline (PAMELOR) 25 MG capsule Take 25 mg by mouth at bedtime. 09/29/19  Yes [provider]  Oxycodone HCl 10 MG TABS Take 10 mg by mouth 5 (five) times daily as needed (pain).  09/29/19  Yes [provider]  pantoprazole (PROTONIX) 40 MG tablet Take 40 mg by mouth daily. 08/04/19  Yes [provider]  Donnal Debar 200-62.5-25 MCG/INH AEPB Inhale 1 puff into the lungs daily. 11/10/19  Yes Garnet Sierras, DO  valsartan (DIOVAN) 160 MG tablet Take 160 mg by mouth daily. 08/04/19  Yes [provider]  methocarbamol (ROBAXIN) 500 MG tablet Take 1 tablet (500 mg total) by  mouth 2 (two) times daily. 11/26/19   Volanda Napoleon, PA-C  ONETOUCH ULTRA test strip 1 each daily. 04/14/19   [provider]  predniSONE (DELTASONE) 20 MG tablet Take 2 tablets (40 mg total) by mouth daily for 4 days. 11/26/19 11/30/19  Volanda Napoleon, PA-C    Allergies: No Known Allergies  Past Medical History: Past Medical History:  Diagnosis Date  . Urticaria   History of diabetes, allergies, asthma   Social History: Recently moved here from [place].  Denies smoking alcohol use illicit drug use.  Usually independent with  daily activities.   Family History:  Family History  Problem Relation Age of Onset  . Asthma Father   . Allergic rhinitis Neg Hx   . Atopy Neg Hx      Review of Systems - History obtained from the patient General ROS: positive for  - chills, fatigue and malaise Psychological ROS: negative Ophthalmic ROS: negative ENT ROS: negative Allergy and Immunology ROS: positive for - seasonal allergies Hematological and Lymphatic ROS: negative Endocrine ROS: negative Respiratory ROS: no cough, shortness of breath, or wheezing Cardiovascular ROS: no chest pain or dyspnea on exertion Gastrointestinal ROS: no abdominal pain, change in bowel habits, or black or bloody stools Genito-Urinary ROS: no dysuria, trouble voiding, or hematuria Musculoskeletal ROS: As in HPI Neurological ROS: no TIA or stroke symptoms Dermatological ROS: negative  Physical Examination  Vitals:   11/27/19 0100 11/27/19 0346 11/27/19 0600 11/27/19 0750  BP: 127/75 109/73 (!) 109/56 115/70  Pulse: (!) 106 (!) 102 98 95  Resp: (!) 22 14 17 14   Temp:  98.5 F (36.9 C)  (!) 97.5 F (36.4 C)  TempSrc:  Oral  Oral  SpO2: 92% 95% 91% 96%  Weight:  86.2 kg    Height:  5' 8"  (1.727 m)      BP 115/70   Pulse 95   Temp (!) 97.5 F (36.4 C) (Oral)   Resp 14   Ht 5' 8"  (1.727 m)   Wt 86.2 kg   SpO2 96%   BMI 28.89 kg/m   General appearance: alert, cooperative, appears stated age and no distress Head: Normocephalic, without obvious abnormality, atraumatic Eyes: conjunctivae/corneas clear. PERRL, EOM's intact.  Throat: lips, mucosa, and tongue normal; teeth and gums normal Neck: no adenopathy, no carotid bruit, no JVD, supple, symmetrical, trachea midline and thyroid not enlarged, symmetric, no tenderness/mass/nodules Resp: clear to auscultation bilaterally Cardio: regular rate and rhythm, S1, S2 normal, no murmur, click, rub or gallop GI: soft, non-tender; bowel sounds normal; no masses,  no organomegaly Back:  No obvious lesions noted.  No swelling.  He is tender mainly in the lower back towards the left.  No erythema noted. Extremities: extremities normal, atraumatic, no cyanosis or edema Pulses: 2+ and symmetric Skin: Skin color, texture, turgor normal. No rashes or lesions Lymph nodes: Cervical, supraclavicular, and axillary nodes normal. Neurologic: Alert and oriented x3.  Able to lift both legs off the bed.  Reflexes are equal.  No focal neurological deficits otherwise.   Labs on Admission: I have personally reviewed following labs and imaging studies  CBC: Recent Labs  Lab 11/26/19 2359  WBC 10.8*  NEUTROABS 8.7*  HGB 13.1  HCT 40.2  MCV 90.1  PLT 335   Basic Metabolic Panel: Recent Labs  Lab 11/26/19 2359  NA 132*  K 4.0  CL 98  CO2 18*  GLUCOSE 93  BUN 14  CREATININE 1.01  CALCIUM 9.2   GFR: Estimated  Creatinine Clearance: 82 mL/min (by C-G formula based on SCr of 1.01 mg/dL). Liver Function Tests: Recent Labs  Lab 11/26/19 2359  AST 18  ALT 19  ALKPHOS 124  BILITOT 2.1*  PROT 8.5*  ALBUMIN 4.4   Recent Labs  Lab 11/26/19 2359  LIPASE 25    Radiological Exams on Admission: DG Chest 2 View  Result Date: 11/26/2019 CLINICAL DATA:  Cough.  Back pain after lifting heavy object today. EXAM: CHEST - 2 VIEW COMPARISON:  None. FINDINGS: Heart size and pulmonary vascularity are normal. Lungs are clear. No pleural effusions. No pneumothorax. Mediastinal contours appear intact. Degenerative changes in the spine. IMPRESSION: No active cardiopulmonary disease. Electronically Signed   By: Lucienne Capers M.D.   On: 11/26/2019 23:46   DG Thoracic Spine 2 View  Result Date: 11/26/2019 CLINICAL DATA:  Back pain after lifting heavy object today. EXAM: THORACIC SPINE 2 VIEWS COMPARISON:  None. FINDINGS: Normal alignment of the thoracic spine. No vertebral compression deformities. Degenerative changes with disc space narrowing and endplate hypertrophic changes mostly in the  midthoracic region. Bone cortex appears intact. No paraspinal soft tissue swelling. IMPRESSION: Degenerative changes in the thoracic spine. No acute displaced fractures identified. Electronically Signed   By: Lucienne Capers M.D.   On: 11/26/2019 23:43   DG Lumbar Spine Complete  Result Date: 11/26/2019 CLINICAL DATA:  Back pain after lifting a heavy object today. EXAM: LUMBAR SPINE - COMPLETE 4+ VIEW COMPARISON:  None. FINDINGS: Five lumbar type vertebral bodies. Normal alignment. No vertebral compression deformities. Mild degenerative changes with slight disc space narrowing and endplate osteophyte formation. Sclerosis and prominent degenerative changes in the posterior elements at L5-S1 on the left. This may indicate spondylolysis. No spondylolisthesis. Visualized sacrum appears intact. IMPRESSION: 1. No acute displaced fractures identified. 2. Possible spondylolysis at L5-S1 on the left with asymmetric prominence of degenerative changes in the posterior elements. Electronically Signed   By: Lucienne Capers M.D.   On: 11/26/2019 23:45   CT Head Wo Contrast  Result Date: 11/27/2019 CLINICAL DATA:  Worsening headache, immunodeficiency, fever, previous sinus and nasal surgery for MRSA infection. EXAM: CT HEAD WITHOUT CONTRAST TECHNIQUE: Contiguous axial images were obtained from the base of the skull through the vertex without intravenous contrast. COMPARISON:  None. FINDINGS: Brain: No evidence of acute infarction, hemorrhage, hydrocephalus, extra-axial collection or mass lesion/mass effect. Vascular: No hyperdense vessel or unexpected calcification. Skull: Normal. Negative for fracture or focal lesion. Sinuses/Orbits: Air-fluid level in the sphenoid sinus likely sinusitis. Mucosal thickening in the paranasal sinuses otherwise. Postoperative changes with partial left turbinate tectum ease and resection of left ethmoid septations. Mastoid air cells are clear. Other: None. IMPRESSION: 1. No acute intracranial  abnormalities. 2. Air-fluid level in the sphenoid sinus likely sinusitis. Electronically Signed   By: Lucienne Capers M.D.   On: 11/27/2019 03:06   CT Cervical Spine Wo Contrast  Result Date: 11/26/2019 CLINICAL DATA:  Status post trauma. EXAM: CT CERVICAL SPINE WITHOUT CONTRAST TECHNIQUE: Multidetector CT imaging of the cervical spine was performed without intravenous contrast. Multiplanar CT image reconstructions were also generated. COMPARISON:  None. FINDINGS: Alignment: Normal. Skull base and vertebrae: No acute fracture. No primary bone lesion or focal pathologic process. Soft tissues and spinal canal: No prevertebral fluid or swelling. No visible canal hematoma. Disc levels: Mild to moderate severity endplate sclerosis is seen at the levels of C5-C6 and C6-C7. Mild intervertebral disc space narrowing is also seen at these levels. Mild to moderate severity bilateral multilevel facet  joint hypertrophy is noted. Upper chest: A 6 mm bone island is seen within the paraspinal region of the third left rib. Other: There is moderate severity sphenoid sinus mucosal thickening. IMPRESSION: 1. No acute fracture or subluxation of the cervical spine. 2. Mild to moderate severity degenerative changes at the levels of C5-C6 and C6-C7. 3. Moderate severity sphenoid sinus mucosal thickening. Electronically Signed   By: Virgina Norfolk M.D.   On: 11/26/2019 21:14     Problem List  Active Problems:   History of frequent upper respiratory infection   Other allergic rhinitis   SIRS (systemic inflammatory response syndrome) (HCC)   Acute back pain   Fever   DM (diabetes mellitus), type 2 ([place])   Assessment: This is a 59 year old Caucasian male with past medical history as stated earlier who comes in with acute back pain exacerbated by lifting heavy boxes.  He was found to have fever raising concern for possible infection.  He was also tachycardic and has SIRS.  Plan:  1. Acute lower back pain: Patient  mention diffuse back pain.  He also has a headache which is chronic for him.  No meningeal signs are noted on examination.  His WBC is minimally elevated.  Imaging studies done so far have not revealed any etiology for his symptoms.  He does not have any neurological deficits although he does have symptoms suggestive of radiculopathy.  MRI of his spine as well as brain is pending.  ESR 95.  CRP 17.  2.  Fever: No obvious source identified.  He did have SIRS criteria based on tachycardia and fever.  Hold off on antibiotics for now.  His UA chest x-ray did not show any acute findings.  COVID-19 test was negative.  Wait on MRI of his spine.  Follow-up on cultures.  3.  Diabetes mellitus type 2: Patient takes [place] Act[place] and [place].  Monitor CBGs.  Check HbA1c.  Place him on SSI only for now.  4.  History of coronary artery disease: He mentions that he has had balloon angioplasty many years ago.  No heart issues since then.  Not noted to be on aspirin or Plavix.  Not noted to be on any beta-blocker.  He is on ARB which is being held for low blood pressures.  5.  Mild hyponatremia: Most likely due to mild hypovolemia.  Continue to monitor.  Gently hydrate.  Noted fentanyl patch listed on his home medication list.  He has not been on this for a long time, ever since he moved to [place].   ADDENDUM: MRI brain cervical spine thoracic spine lumbar spine were done.  No acute findings noted in the spinal images.  MRI brain did not show any acute intracranial findings but suggestion of acute sinusitis noted.  Patient does have facial congestion.  Patient was told about the results.  Reason for fever is not entirely clear but could be due to sinusitis.  Will order Augmentin.  Muscle relaxants for his back.  PT and OT.  Hopefully home tomorrow when back pain is adequately controlled.  DVT Prophylaxis: SCDs for now Code Status: Full code Family Communication: Discussed with the patient Disposition:  Hopefully return home when improved Consults called: None yet Admission Status: Status is: Observation  The patient remains OBS appropriate and will d/c before 2 midnights.  Dispo: The patient is from: Home              Anticipated d/c is to: Home  Anticipated d/c date is: 1 day              Patient currently is not medically stable to d/c.    Severity of Illness: The appropriate patient status for this patient is OBSERVATION. Observation status is judged to be reasonable and necessary in order to provide the required intensity of service to ensure the patient's safety. The patient's presenting symptoms, physical exam findings, and initial radiographic and laboratory data in the context of their medical condition is felt to place them at decreased risk for further clinical deterioration. Furthermore, it is anticipated that the patient will be medically stable for discharge from the hospital within 2 midnights of admission. The following factors support the patient status of observation.   " The patient's presenting symptoms include back pain. " The physical exam findings include tenderness over back. " The initial radiographic and laboratory data are unremarkable as yet.   Further management decisions will depend on results of further testing and patient's response to treatment.   Kambri Dismore [employer]  [facility] operational officer on [email or web address]  11/27/2019, 8:06 AM

## 2019-11-28 ENCOUNTER — Inpatient Hospital Stay (HOSPITAL_COMMUNITY): Payer: BC Managed Care – PPO

## 2019-11-28 ENCOUNTER — Observation Stay (HOSPITAL_COMMUNITY): Payer: BC Managed Care – PPO

## 2019-11-28 DIAGNOSIS — E872 Acidosis: Secondary | ICD-10-CM | POA: Diagnosis present

## 2019-11-28 DIAGNOSIS — M5442 Lumbago with sciatica, left side: Secondary | ICD-10-CM | POA: Diagnosis present

## 2019-11-28 DIAGNOSIS — Z825 Family history of asthma and other chronic lower respiratory diseases: Secondary | ICD-10-CM | POA: Diagnosis not present

## 2019-11-28 DIAGNOSIS — R519 Headache, unspecified: Secondary | ICD-10-CM

## 2019-11-28 DIAGNOSIS — E119 Type 2 diabetes mellitus without complications: Secondary | ICD-10-CM | POA: Diagnosis not present

## 2019-11-28 DIAGNOSIS — Z20822 Contact with and (suspected) exposure to covid-19: Secondary | ICD-10-CM | POA: Diagnosis present

## 2019-11-28 DIAGNOSIS — E876 Hypokalemia: Secondary | ICD-10-CM | POA: Diagnosis not present

## 2019-11-28 DIAGNOSIS — E871 Hypo-osmolality and hyponatremia: Secondary | ICD-10-CM | POA: Diagnosis present

## 2019-11-28 DIAGNOSIS — I2510 Atherosclerotic heart disease of native coronary artery without angina pectoris: Secondary | ICD-10-CM | POA: Diagnosis present

## 2019-11-28 DIAGNOSIS — Z79899 Other long term (current) drug therapy: Secondary | ICD-10-CM | POA: Diagnosis not present

## 2019-11-28 DIAGNOSIS — R6510 Systemic inflammatory response syndrome (SIRS) of non-infectious origin without acute organ dysfunction: Secondary | ICD-10-CM | POA: Diagnosis present

## 2019-11-28 DIAGNOSIS — J0100 Acute maxillary sinusitis, unspecified: Secondary | ICD-10-CM | POA: Diagnosis present

## 2019-11-28 DIAGNOSIS — M545 Low back pain: Secondary | ICD-10-CM

## 2019-11-28 DIAGNOSIS — R112 Nausea with vomiting, unspecified: Secondary | ICD-10-CM | POA: Diagnosis not present

## 2019-11-28 DIAGNOSIS — K5900 Constipation, unspecified: Secondary | ICD-10-CM | POA: Diagnosis present

## 2019-11-28 DIAGNOSIS — B349 Viral infection, unspecified: Secondary | ICD-10-CM | POA: Diagnosis present

## 2019-11-28 DIAGNOSIS — R1110 Vomiting, unspecified: Secondary | ICD-10-CM

## 2019-11-28 DIAGNOSIS — J0191 Acute recurrent sinusitis, unspecified: Secondary | ICD-10-CM

## 2019-11-28 DIAGNOSIS — R509 Fever, unspecified: Secondary | ICD-10-CM | POA: Diagnosis not present

## 2019-11-28 LAB — CSF CELL COUNT WITH DIFFERENTIAL
RBC Count, CSF: 0 /mm3
Tube #: 4
WBC, CSF: 0 /mm3 (ref 0–5)

## 2019-11-28 LAB — GLUCOSE, CSF: Glucose, CSF: 70 mg/dL (ref 40–70)

## 2019-11-28 LAB — BASIC METABOLIC PANEL
Anion gap: 19 — ABNORMAL HIGH (ref 5–15)
BUN: 18 mg/dL (ref 8–23)
CO2: 11 mmol/L — ABNORMAL LOW (ref 22–32)
Calcium: 8.8 mg/dL — ABNORMAL LOW (ref 8.9–10.3)
Chloride: 101 mmol/L (ref 98–111)
Creatinine, Ser: 1.05 mg/dL (ref 0.61–1.24)
GFR calc Af Amer: >60 mL/min (ref >60)
GFR calc non Af Amer: >60 mL/min (ref >60)
Glucose, Bld: 107 mg/dL — ABNORMAL HIGH (ref 70–99)
Potassium: 4.8 mmol/L (ref 3.5–5.1)
Sodium: 131 mmol/L — ABNORMAL LOW (ref 135–145)

## 2019-11-28 LAB — DIFFERENTIAL
Abs Immature Granulocytes: 0.05 10*3/uL (ref 0.00–0.07)
Basophils Absolute: 0 10*3/uL (ref 0.0–0.1)
Basophils Relative: 0 %
Eosinophils Absolute: 0 10*3/uL (ref 0.0–0.5)
Eosinophils Relative: 0 %
Immature Granulocytes: 1 %
Lymphocytes Relative: 9 %
Lymphs Abs: 0.9 10*3/uL (ref 0.7–4.0)
Monocytes Absolute: 1 10*3/uL (ref 0.1–1.0)
Monocytes Relative: 9 %
Neutro Abs: 8.9 10*3/uL — ABNORMAL HIGH (ref 1.7–7.7)
Neutrophils Relative %: 81 %

## 2019-11-28 LAB — COMPREHENSIVE METABOLIC PANEL
ALT: 18 U/L (ref 0–44)
AST: 17 U/L (ref 15–41)
Albumin: 3.8 g/dL (ref 3.5–5.0)
Alkaline Phosphatase: 109 U/L (ref 38–126)
Anion gap: 17 — ABNORMAL HIGH (ref 5–15)
BUN: 17 mg/dL (ref 8–23)
CO2: 13 mmol/L — ABNORMAL LOW (ref 22–32)
Calcium: 9.1 mg/dL (ref 8.9–10.3)
Chloride: 100 mmol/L (ref 98–111)
Creatinine, Ser: 0.94 mg/dL (ref 0.61–1.24)
GFR calc Af Amer: >60 mL/min (ref >60)
GFR calc non Af Amer: >60 mL/min (ref >60)
Glucose, Bld: 91 mg/dL (ref 70–99)
Potassium: 4.6 mmol/L (ref 3.5–5.1)
Sodium: 130 mmol/L — ABNORMAL LOW (ref 135–145)
Total Bilirubin: 1.8 mg/dL — ABNORMAL HIGH (ref 0.3–1.2)
Total Protein: 7.6 g/dL (ref 6.5–8.1)

## 2019-11-28 LAB — URINALYSIS, ROUTINE W REFLEX MICROSCOPIC
Bacteria, UA: NONE SEEN
Bilirubin Urine: NEGATIVE
Glucose, UA: >=500 mg/dL — AB
Hgb urine dipstick: NEGATIVE
Ketones, ur: 80 mg/dL — AB
Leukocytes,Ua: NEGATIVE
Nitrite: NEGATIVE
Protein, ur: NEGATIVE mg/dL
Specific Gravity, Urine: 1.018 (ref 1.005–1.030)
pH: 5 (ref 5.0–8.0)

## 2019-11-28 LAB — GLUCOSE, CAPILLARY
Glucose-Capillary: 102 mg/dL — ABNORMAL HIGH (ref 70–99)
Glucose-Capillary: 91 mg/dL (ref 70–99)
Glucose-Capillary: 155 mg/dL — ABNORMAL HIGH (ref 70–99)
Glucose-Capillary: 146 mg/dL — ABNORMAL HIGH (ref 70–99)

## 2019-11-28 LAB — CBC
HCT: 41.7 % (ref 39.0–52.0)
Hemoglobin: 13.4 g/dL (ref 13.0–17.0)
MCH: 29.5 pg (ref 26.0–34.0)
MCHC: 32.1 g/dL (ref 30.0–36.0)
MCV: 91.6 fL (ref 80.0–100.0)
Platelets: 328 10*3/uL (ref 150–400)
RBC: 4.55 MIL/uL (ref 4.22–5.81)
RDW: 13.2 % (ref 11.5–15.5)
WBC: 10.9 10*3/uL — ABNORMAL HIGH (ref 4.0–10.5)
nRBC: 0 % (ref 0.0–0.2)

## 2019-11-28 LAB — OSMOLALITY, URINE: Osmolality, Ur: 671 mOsm/kg (ref 300–900)

## 2019-11-28 LAB — HEMOGLOBIN A1C
Hgb A1c MFr Bld: 7.9 % — ABNORMAL HIGH (ref 4.8–5.6)
Mean Plasma Glucose: 180.03 mg/dL

## 2019-11-28 LAB — URINE CULTURE: Culture: <10000 — AB

## 2019-11-28 LAB — OSMOLALITY: Osmolality: 291 mOsm/kg (ref 275–295)

## 2019-11-28 LAB — HIV ANTIBODY (ROUTINE TESTING W REFLEX): HIV Screen 4th Generation wRfx: NONREACTIVE

## 2019-11-28 LAB — PROTEIN, CSF: Total  Protein, CSF: 92 mg/dL — ABNORMAL HIGH (ref 15–45)

## 2019-11-28 LAB — SODIUM, URINE, RANDOM: Sodium, Ur: 114 mmol/L

## 2019-11-28 LAB — TSH: TSH: 1.109 u[IU]/mL (ref 0.350–4.500)

## 2019-11-28 IMAGING — CR DG CHEST 2V
2 series · 2 of 2 positions shown · non-contrast
Comparison: [DATE]

CLINICAL DATA: Patient came in 2 days ago for back pain. Now with
nausea, vomiting and fever.

EXAM:
CHEST - 2 VIEW

[w chest pa]
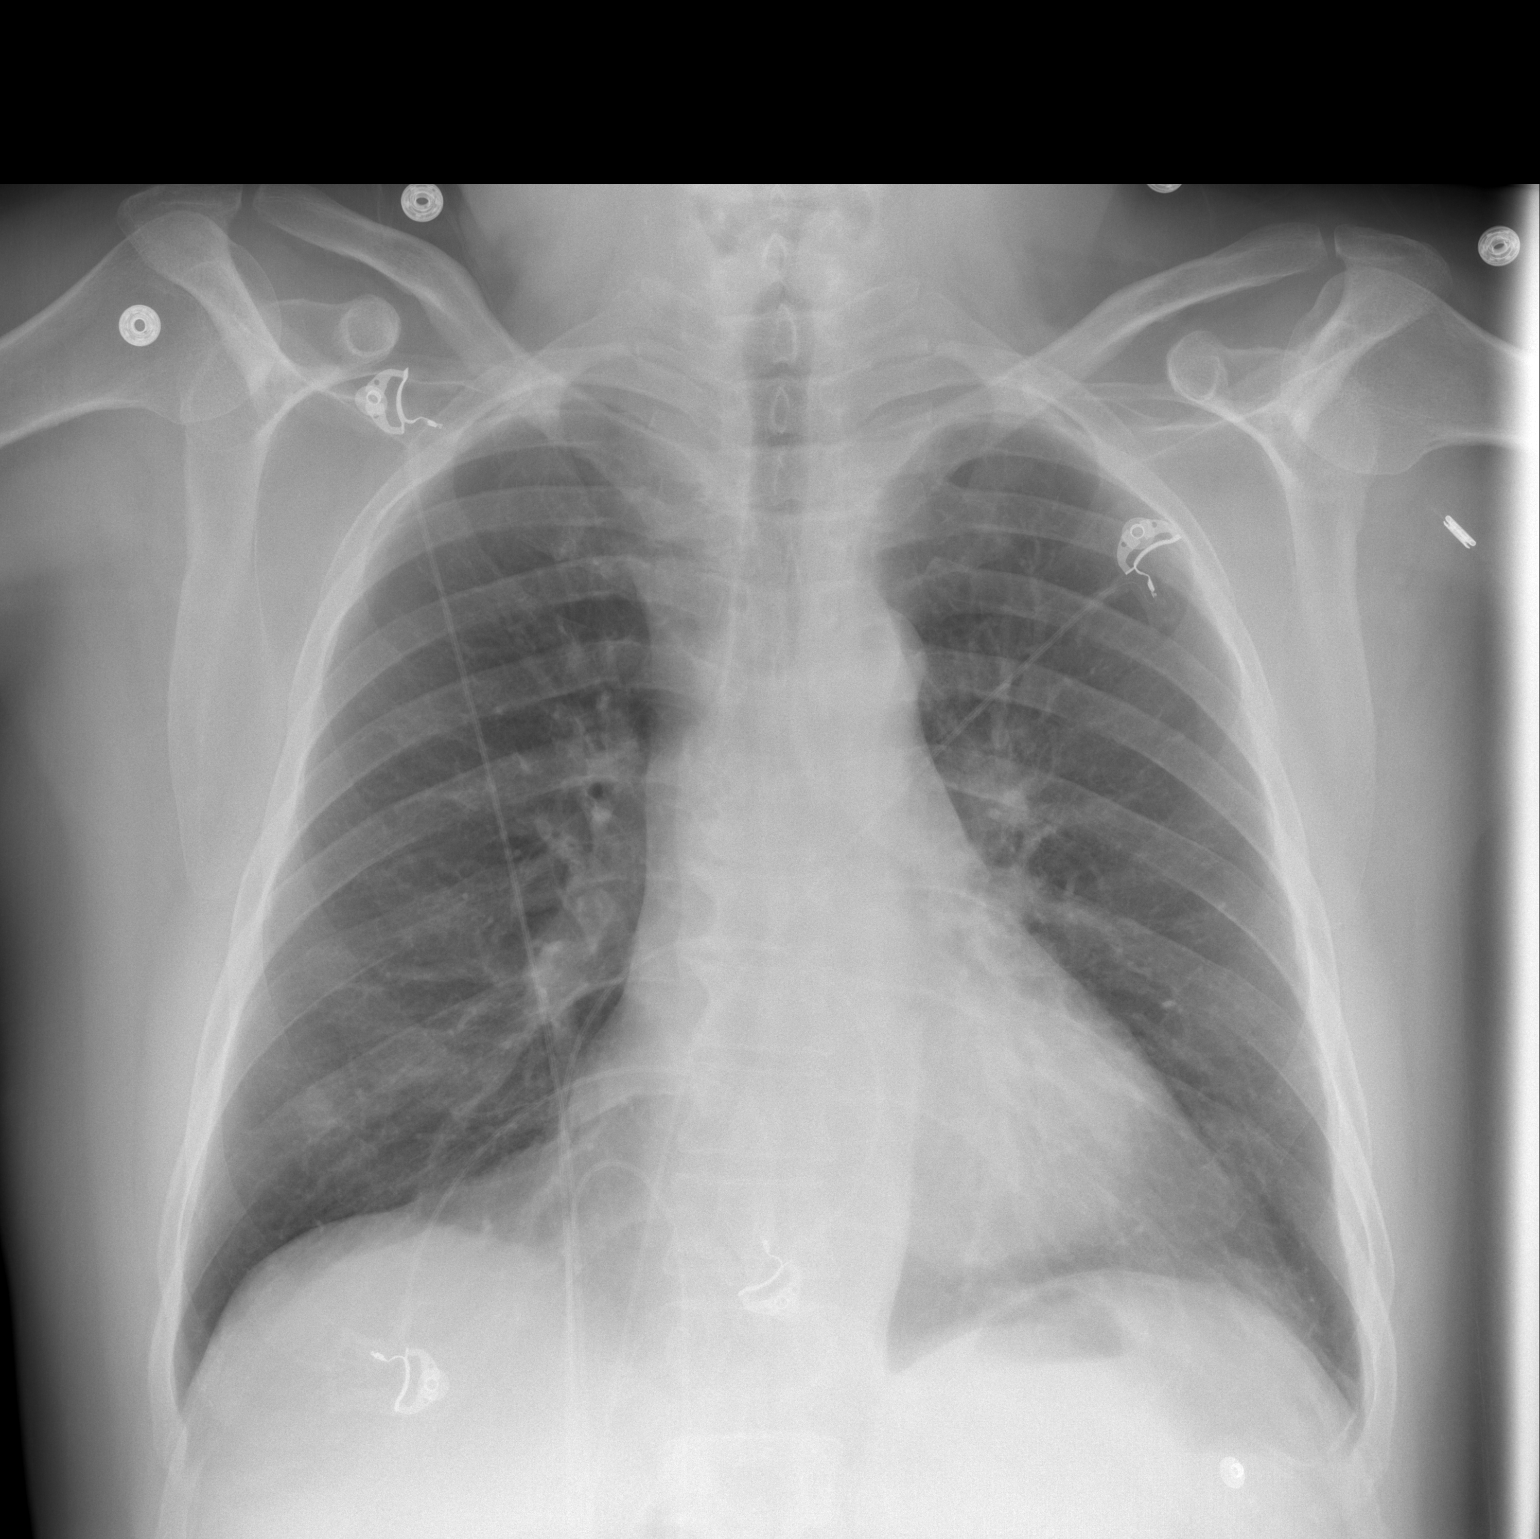

[w chest lat]
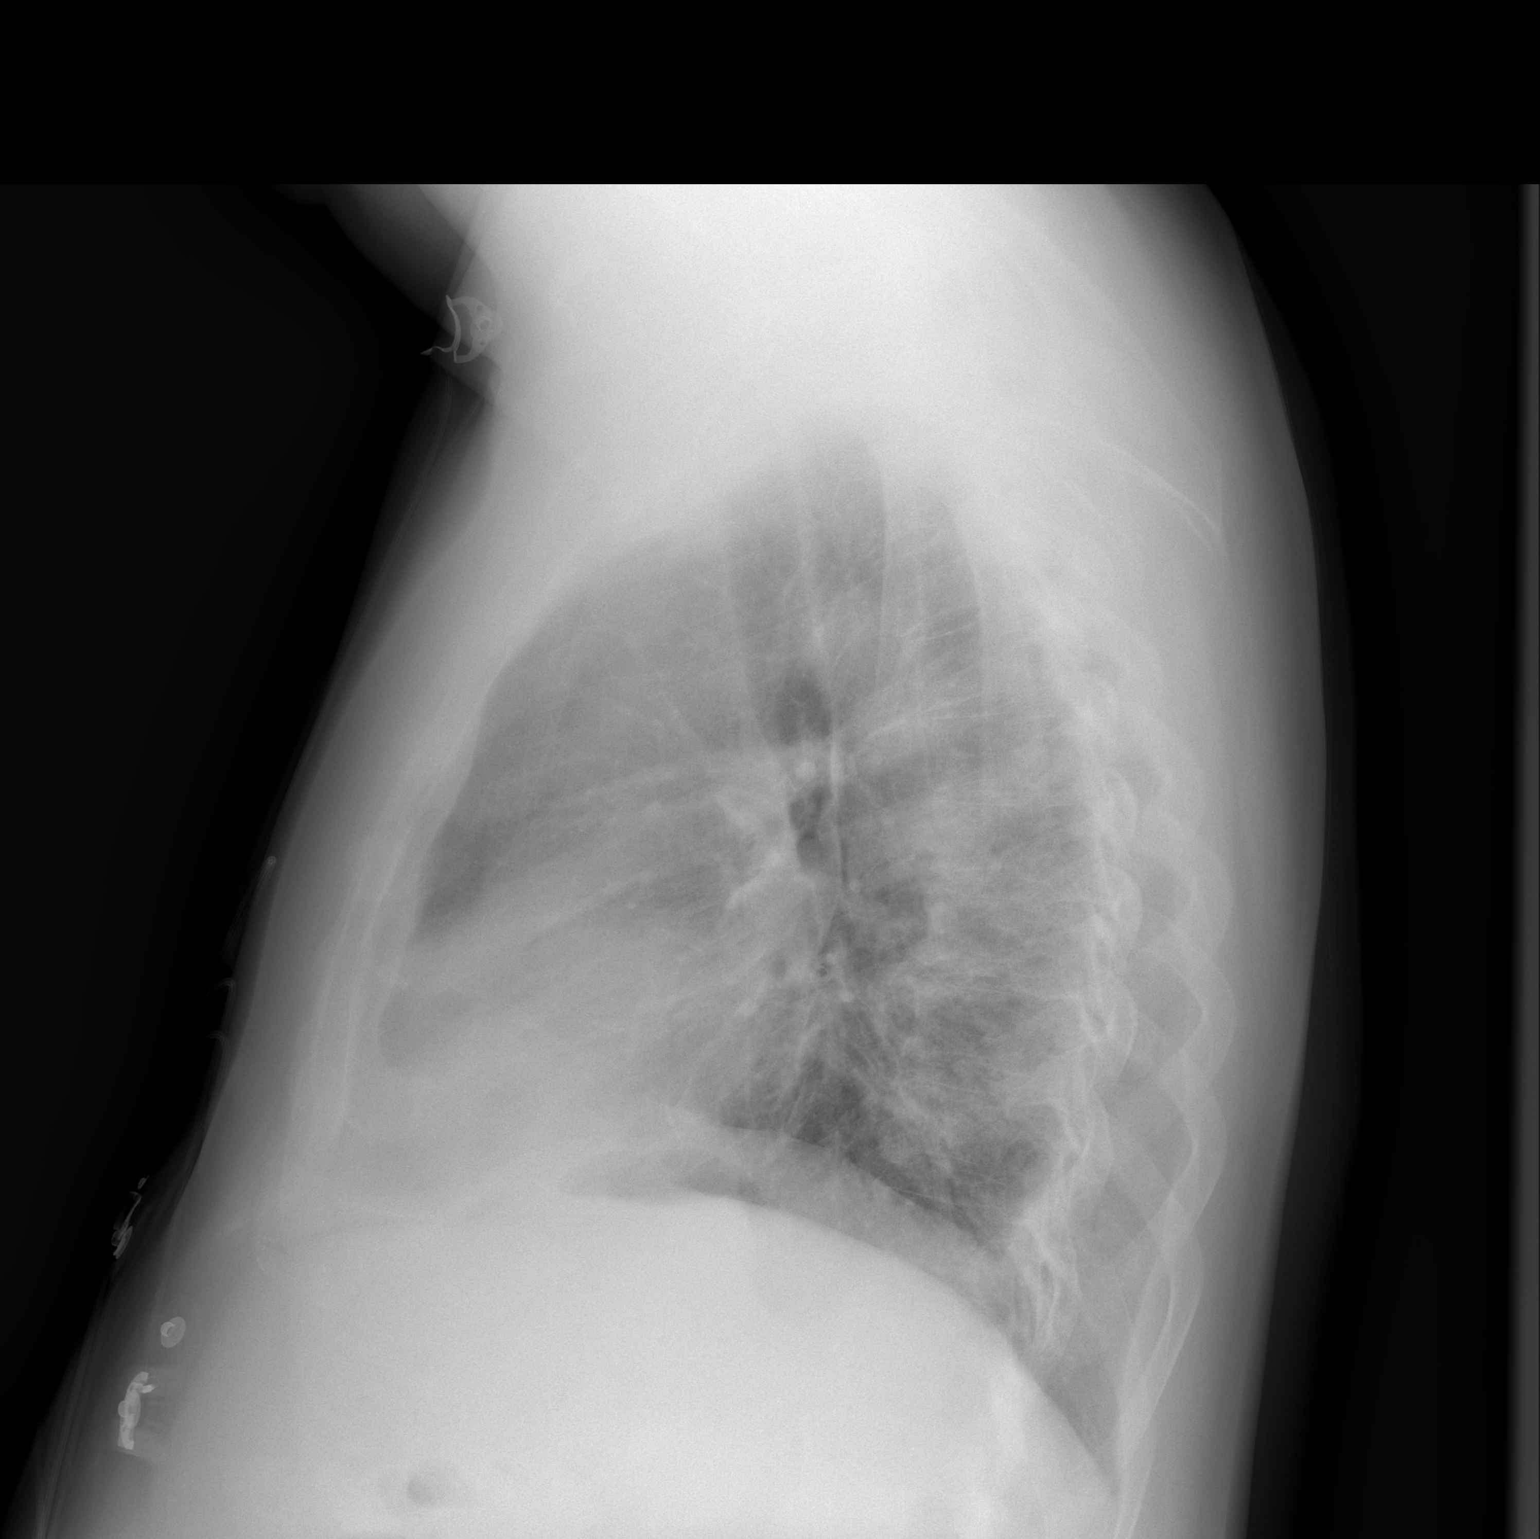

[2 of 2 positions shown; findings below may reference images not displayed]

FINDINGS: The heart size and mediastinal contours are within normal limits.
Both lungs are clear. The visualized skeletal structures are
unremarkable.
IMPRESSION: No active cardiopulmonary disease.

## 2019-11-28 IMAGING — CR DG ABDOMEN 2V
3 series · 3 of 3 positions shown · non-contrast
Comparison: None.

CLINICAL DATA: Nausea, vomiting, fever.

EXAM:
ABDOMEN - 2 VIEW

[w abdomen upright *]
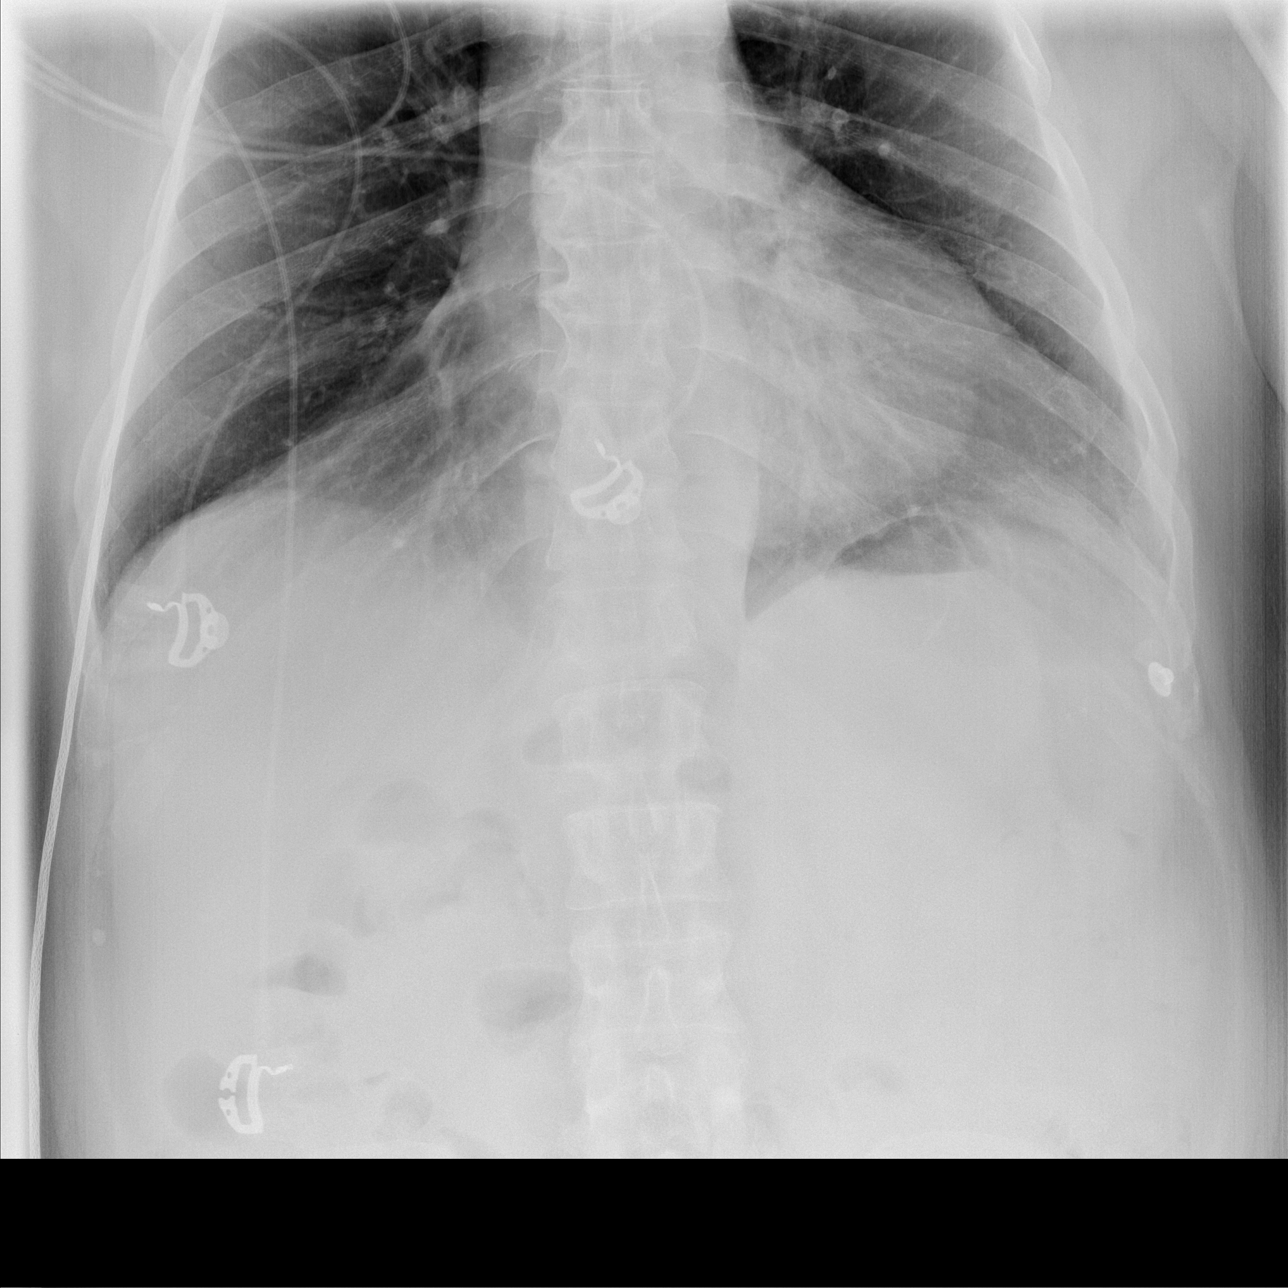

[t abdomen supine (1 of 2)]
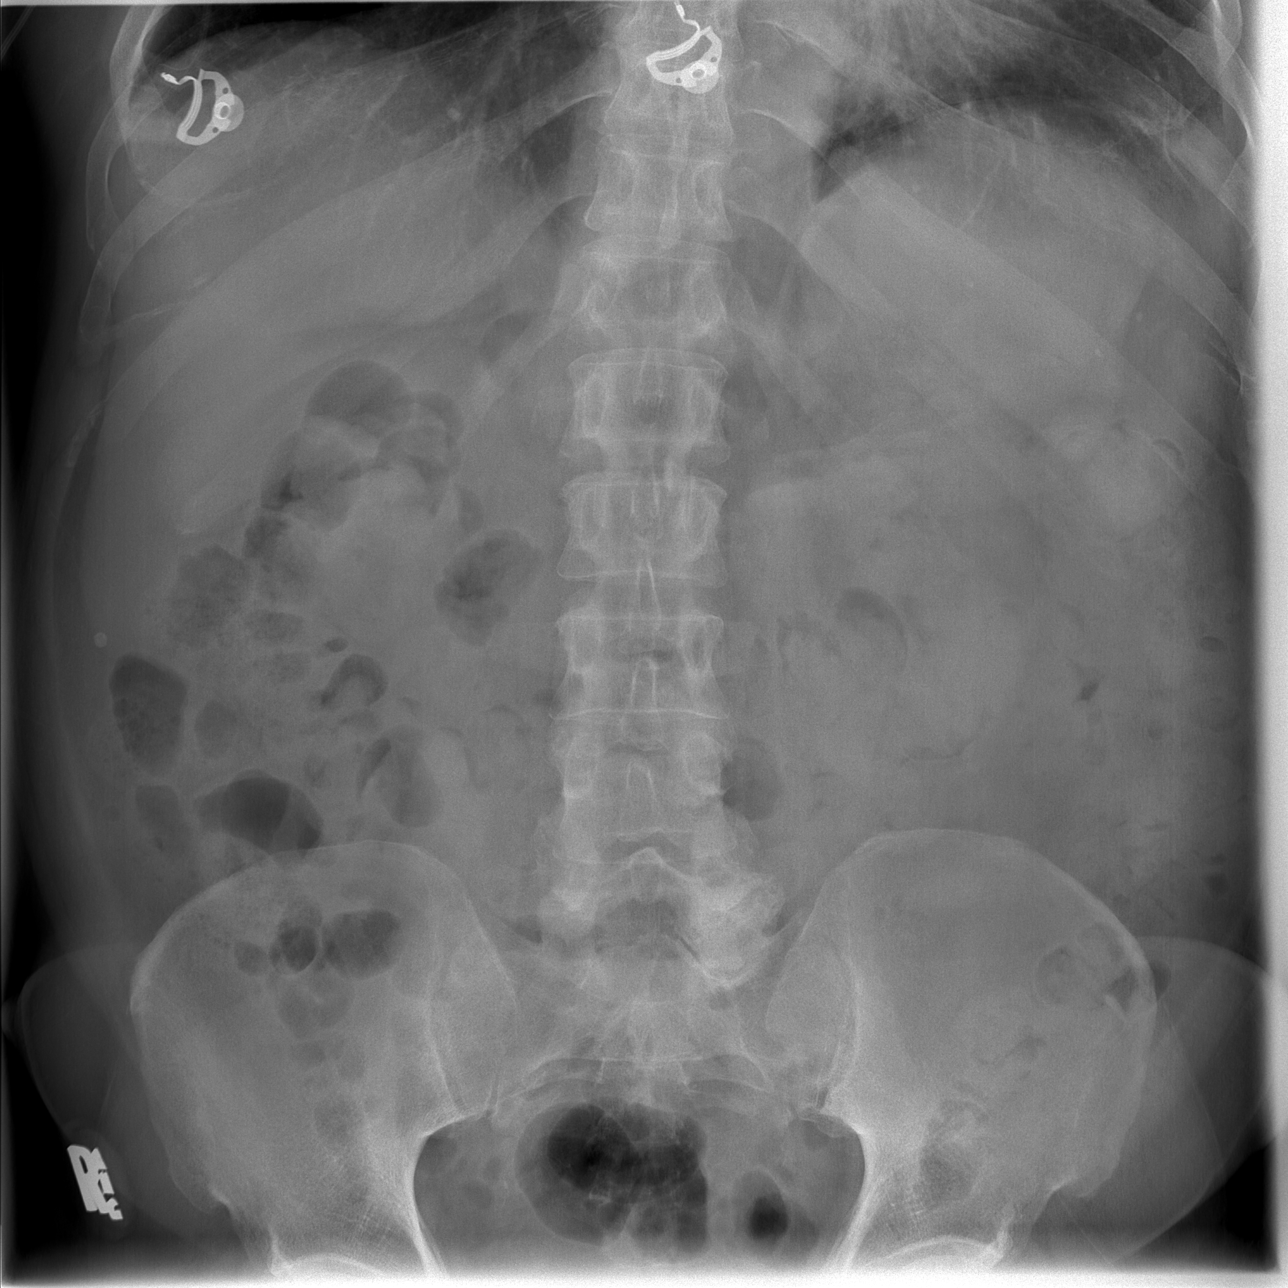

[t abdomen supine (2 of 2)]
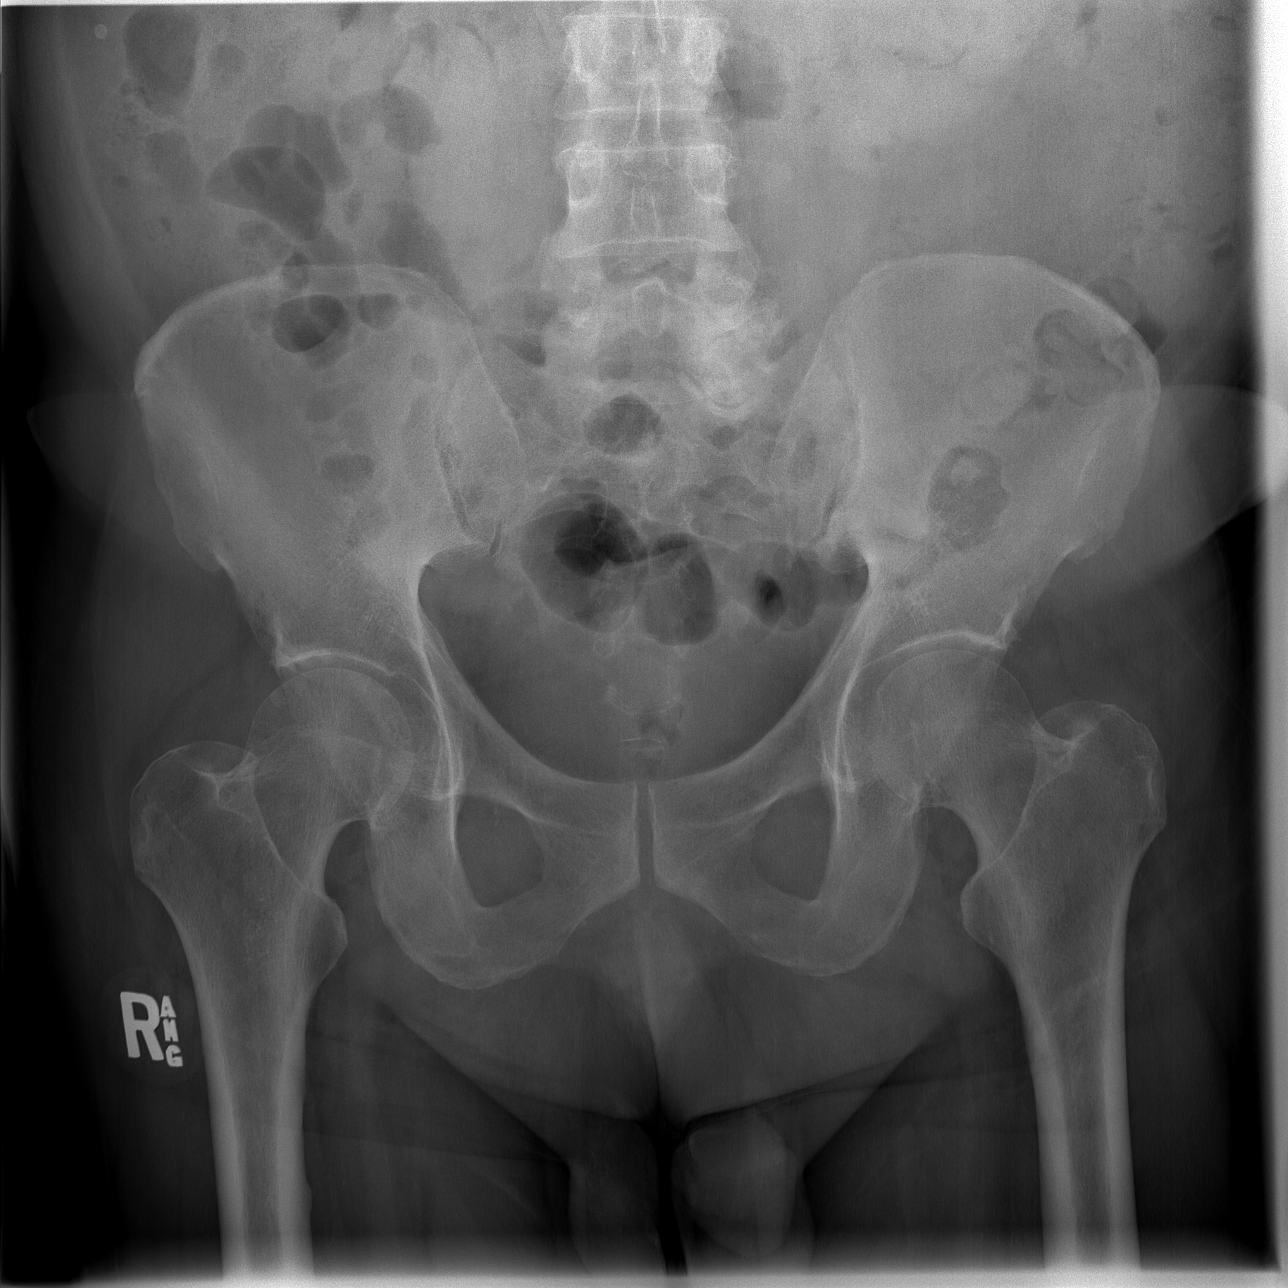

[3 of 3 positions shown; findings below may reference images not displayed]

FINDINGS: Bowel gas pattern is nonobstructed. Moderate stool burden. No
evidence for organomegaly. No abnormal calcifications. Degenerative
changes are seen in the lumbosacral spine.
IMPRESSION: Moderate stool burden.

## 2019-11-28 IMAGING — RF DG FLUORO GUIDE SPINAL/SI JT INJ*L*
1 series · 1 of 1 positions shown · non-contrast
Comparison: none

CLINICAL DATA: Severe headache.  Negative spinal MRI.

[Series 1: cp_standard · 0.17mm/px · 1 of 1 slices shown]
[im 1/1]
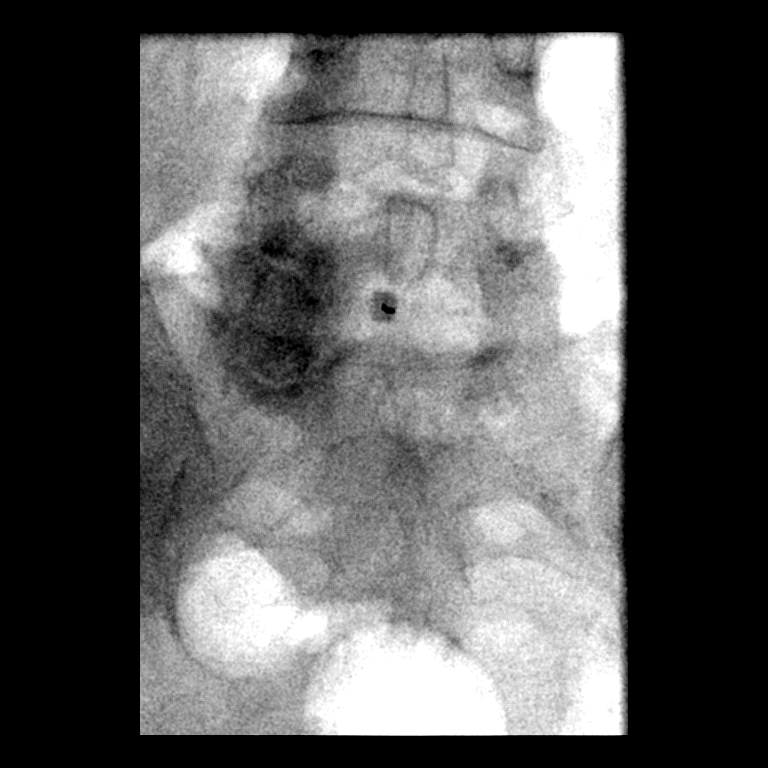

[1 of 1 positions shown; findings below may reference images not displayed]

EXAM:
DIAGNOSTIC LUMBAR PUNCTURE UNDER FLUOROSCOPIC GUIDANCE

FLUOROSCOPY TIME:  Radiation Exposure Index (as provided by the
fluoroscopic device): 27.9 mGy

If the device does not provide the exposure index:

Fluoroscopy Time (in minutes and seconds):  1 minutes 18 seconds

Number of Acquired Images:  1

PROCEDURE:
Informed consent was obtained from the patient prior to the
procedure, including potential complications of headache, allergy,
and pain. With the patient prone, the lower back was prepped with
Betadine. 1% Lidocaine was used for local anesthesia. Lumbar
puncture was performed at the L5-S1 level using a 20 gauge needle
with return of clear CSF with an opening pressure of 23 cm water.
Nine ml of CSF were obtained for laboratory studies. The patient
tolerated the procedure well and there were no apparent
complications.
IMPRESSION: Successful lumbar puncture.

## 2019-11-28 MED ORDER — SODIUM BICARBONATE-DEXTROSE 150-5 MEQ/L-% IV SOLN
150.0000 meq | INTRAVENOUS | Status: DC
  Administered 2019-11-28 – 2019-11-29 (×3): 150 meq via INTRAVENOUS
  Filled 2019-11-28 (×5): qty 1000

## 2019-11-28 MED ORDER — SODIUM BICARBONATE 8.4 % IV SOLN: INTRAVENOUS | Status: DC

## 2019-11-28 MED ORDER — SODIUM CHLORIDE 0.9 % IV SOLN: INTRAVENOUS | Status: DC

## 2019-11-28 MED ORDER — SODIUM CHLORIDE 0.9 % IV BOLUS
1000.0000 mL | Freq: Once | INTRAVENOUS | Status: AC
  Administered 2019-11-28: 1000 mL via INTRAVENOUS

## 2019-11-28 MED ORDER — SODIUM CHLORIDE 0.9 % IV SOLN
3.0000 g | Freq: Four times a day (QID) | INTRAVENOUS | Status: DC
  Administered 2019-11-28 – 2019-12-02 (×15): 3 g via INTRAVENOUS
  Filled 2019-11-28 (×2): qty 3
  Filled 2019-11-28: qty 8
  Filled 2019-11-28 (×3): qty 3
  Filled 2019-11-28 (×2): qty 8
  Filled 2019-11-28 (×3): qty 3
  Filled 2019-11-28: qty 8
  Filled 2019-11-28 (×3): qty 3
  Filled 2019-11-28: qty 8

## 2019-11-28 MED ORDER — PROCHLORPERAZINE EDISYLATE 10 MG/2ML IJ SOLN
10.0000 mg | Freq: Four times a day (QID) | INTRAMUSCULAR | Status: DC | PRN
  Administered 2019-11-29: 10 mg via INTRAVENOUS
  Filled 2019-11-28: qty 2

## 2019-11-28 MED ORDER — PANTOPRAZOLE SODIUM 40 MG IV SOLR
40.0000 mg | Freq: Two times a day (BID) | INTRAVENOUS | Status: DC
  Administered 2019-11-28 – 2019-11-30 (×5): 40 mg via INTRAVENOUS
  Filled 2019-11-28 (×5): qty 40

## 2019-11-28 MED ORDER — SODIUM CHLORIDE 0.9 % IV SOLN
8.0000 mg | Freq: Four times a day (QID) | INTRAVENOUS | Status: DC | PRN
  Filled 2019-11-28: qty 4

## 2019-11-28 MED ORDER — ONDANSETRON HCL 4 MG PO TABS
8.0000 mg | ORAL_TABLET | Freq: Four times a day (QID) | ORAL | Status: DC | PRN
  Administered 2019-11-28: 8 mg via ORAL
  Filled 2019-11-28 (×2): qty 2

## 2019-11-28 NOTE — Procedures (Addendum)
EXAM: DIAGNOSTIC LUMBAR PUNCTURE UNDER FLUOROSCOPIC GUIDANCE FLUOROSCOPY TIME: Radiation Exposure Index (as provided by the fluoroscopic device): [27.9 mGy]  If the device does not provide the exposure index: Fluoroscopy Time (in minutes and seconds): [1 minutes 18 seconds] Number of Acquired Images: [1] PROCEDURE: Informed consent was obtained from the patient prior to the procedure, including potential complications of headache, allergy, and pain. With the patient prone, the lower back was prepped with Betadine. 1% Lidocaine was used for local anesthesia. Lumbar puncture was performed at the [L5-S1] level using a [20] gauge needle with return of [clear] CSF with an opening pressure of [23] cm water. [Nine] ml of CSF were obtained for laboratory studies. The patient tolerated the procedure well and there were no apparent complications.  IMPRESSION: [Successful lumbar puncture.]

## 2019-11-28 NOTE — Progress Notes (Signed)
PROGRESS NOTE    Bryan Lopez  ONG:[id number] DOB: [birth year] DOA: 11/26/2019 PCP: Patient, [name]    Chief Complaint  Patient presents with  . Back Pain    Brief Narrative:  Bryan Lopez is a 59 y.o. male with a past medical history of diabetes mellitus type 2 on insulin, history of sinusitis, history of asthma, who apparently recently moved here from [place].  He has been lifting a lot of heavy objects over the past couple of months and has noticed some back pain.  Yesterday he lifted some heavy boxes and experienced severe pain in his back.  Transiently he felt some numbness in both his fingers.  Currently he is experiencing severe back pain in his left lower back radiating down to his left leg.  Denies any red flag symptoms such as bowel or bladder incontinence.  He mentions that he is also experienced some chills at home and felt feverish although he did not check his temperature.  Denies any weakness per se in his legs.  Denies any shortness of breath cough.  He is unvaccinated for Covid.  Denies any dysuria.  No diarrhea.  He did travel to [place] last week.  Currently the pain in the back is about 8 out of 10 in intensity.  He is also nauseated.  In the emergency department patient underwent plain films of his back which did not show any acute findings.  Due to fever, elevated CRP, MRI has been ordered and is still pending   Assessment & Plan:   Active Problems:   History of frequent upper respiratory infection   Other allergic rhinitis   SIRS (systemic inflammatory response syndrome) (HCC)   Acute back pain   Fever   DM (diabetes mellitus), type 2 ([place])  #1 acute lower back pain/headache/nausea and vomiting Patient noted to have nausea and vomiting overnight and this morning.  Patient with complaints of significant head pain.  Still with complaints of lower back pain.  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  Patient with low-grade temp of 100.9.  Patient with a slight leukocytosis white count of 10.9.  Complains of headache.  Blood cultures pending with no growth to date.  Patient on oral Augmentin.  Check a chest x-ray, abdominal films.  Patient will need an LP to complete work-up as MRI with no acute abnormalities aside from acute sinusitis noted.  Case discussed with neuro hospitalist who recommended LP under IR.  CT head done on admission negative for any acute abnormalities.  Spoke with Dr. Anselm Pancoast of IR to assess patient for possible LP to be done today.  DC Augmentin as patient with ongoing nausea and emesis and placed on IV Unasyn.  Follow.  2.  Fever/SIRS Questionable etiology.  Patient on presentation met criteria for systemic inflammatory response with tachycardia and fever.  Urinalysis unremarkable.  Chest x-ray unremarkable.  COVID-19 PCR test was negative.  MRI of the L-spine, C-spine, T-spine, brain with no significant abnormalities except for sinusitis.  Patient with complaints of headache, nausea vomiting and ongoing lower back pain.  See problem #1.  We will have IR evaluate for LP.  DC Augmentin and placed empirically on IV Unasyn.  Supportive care.  Follow.  3.  Metabolic acidosis Questionable etiology.  Likely secondary to ongoing nausea vomiting low back pain.  Patient has been pancultured.  Patient  with a history of diabetes however blood glucose levels within normal limits.  Repeat UA with cultures and sensitivities.  Placed on bicarb drip.  IV fluid bolus.  Repeat be met this afternoon.  Follow.  4.  Diabetes mellitus type 2 Patient noted to be on [place], [place], [place].  Hemoglobin A1c of 7.9.  Patient with a blood glucose level of 102 this morning.  Continue to hold oral hypoglycemic agents.  Sliding scale insulin.  5.  Hyponatremia Likely secondary to hypovolemic hyponatremia patient stated had nausea and vomiting all  night long as well as nausea and vomiting this morning.  Repeat UA.  Check a urine sodium.  Check a osmolality.  Check a TSH.  IV fluids.  Repeat labs this afternoon.  Follow.  6.  Coronary artery disease Patient mention to admitting physician that he had a balloon angioplasty done several years ago.  No heart issues since then.  Patient not on antiplatelet therapy.  Patient not on a beta-blocker.  Patient noted to be on an ARB which is currently on hold due to low blood pressure.  Follow.  7.  Acute sinusitis Noted on MRI of the brain.  Patient noted to have some facial pain/congestion.  Patient started on Augmentin however patient with ongoing nausea and emesis overnight and as such we will discontinue Augmentin and placed on IV Unasyn.  IV fluids.  Supportive care.  Follow.   DVT prophylaxis: SCDs Code Status: Full Family Communication: Updated patient.  No family at bedside. Disposition:   Status is: Inpatient    Dispo: The patient is from: Home              Anticipated d/c is to: Home              Anticipated d/c date is: To be determined.              Patient currently with complaints of significant headache and back pain with nausea and emesis overnight.  Not medically stable for discharge.       Consultants:   IR for LP.  Procedures:   CT head 10/11/2019  CT C-spine 11/26/2019  Chest x-ray 11/28/2019 pending,  Abdominal films 11/28/2019 pending  Chest x-ray 11/26/2019  Plain films of the L-spine and T-spine 11/26/2019  MRI brain MRI C-spine 11/27/2019  MRI L/MRI T-spine 11/27/2018  Antimicrobials:   Amoxicillin 11/27/2019>>>> 11/28/2019  IV Unasyn 11/28/2019   Subjective: Patient laying in bed holding his head.  Complaining of headache.  Still with lower back pain.  Complaining of some nausea.  Stated he had emesis all night long.  Patient does state he has a prior history of MRSA sinusitis that went into his spine as well as into his brain and had been treated  intermittently with antibiotics over the past year while he was in [place].  Objective: Vitals:   11/28/19 0345 11/28/19 0745 11/28/19 0835 11/28/19 0923  BP: 138/83 139/87    Pulse:  (!) 107    Resp: 17 16    Temp: 99.8 F (37.7 C) 98.4 F (36.9 C) 98.6 F (37 C)   TempSrc: Rectal Oral Rectal   SpO2: 93% 93%  93%  Weight:      Height:        Intake/Output Summary (Last 24 hours) at 11/28/2019 1143 Last data filed at 11/28/2019 1000 Gross per 24 hour  Intake 1055.89 ml  Output 1025 ml  Net 30.89 ml   Filed Weights   11/27/19 0346  Weight: 86.2 kg    Examination:  General exam: Appears calm and comfortable.  Respiratory system: Some diffuse coarse breath sounds.  No wheezing.  Normal respiratory effort. Cardiovascular system: Regular rate rhythm no murmurs rubs or gallops.  No JVD.  No lower extremity edema. Gastrointestinal system: Abdomen is soft, nontender, nondistended, positive bowel sounds.  No rebound.  No guarding.  Central nervous system: Alert and oriented. No focal neurological deficits. Extremities: Symmetric 5 x 5 power. Skin: No rashes, lesions or ulcers Psychiatry: Judgement and insight appear normal. Mood & affect appropriate.     Data Reviewed: I have personally reviewed following labs and imaging studies  CBC: Recent Labs  Lab 11/26/19 2359 11/28/19 0538  WBC 10.8* 10.9*  NEUTROABS 8.7*  --   HGB 13.1 13.4  HCT 40.2 41.7  MCV 90.1 91.6  PLT 289 750    Basic Metabolic Panel: Recent Labs  Lab 11/26/19 2359 11/28/19 0538 11/28/19 0905  NA 132* 130* 131*  K 4.0 4.6 4.8  CL 98 100 101  CO2 18* 13* 11*  GLUCOSE 93 91 107*  BUN 14 17 18   CREATININE 1.01 0.94 1.05  CALCIUM 9.2 9.1 8.8*    GFR: Estimated Creatinine Clearance: 78.9 mL/min (by C-G formula based on SCr of 1.05 mg/dL).  Liver Function Tests: Recent Labs  Lab 11/26/19 2359 11/28/19 0538  AST 18 17  ALT 19 18  ALKPHOS 124 109  BILITOT 2.1* 1.8*  PROT 8.5* 7.6    ALBUMIN 4.4 3.8    CBG: Recent Labs  Lab 11/27/19 1305 11/27/19 1813 11/27/19 2136 11/28/19 0741 11/28/19 1118  GLUCAP 83 84 77 102* 91     Recent Results (from the past 240 hour(s))  SARS Coronavirus 2 by RT PCR (hospital order, performed in [facility] hospital lab) Nasopharyngeal Nasopharyngeal Swab     Status: None   Collection Time: 11/26/19 11:59 PM   Specimen: Nasopharyngeal Swab  Result Value Ref Range Status   SARS Coronavirus 2 NEGATIVE NEGATIVE Final    Comment: (NOTE) SARS-CoV-2 target nucleic acids are NOT DETECTED.  The SARS-CoV-2 RNA is generally detectable in upper and lower respiratory specimens during the acute phase of infection. The lowest concentration of SARS-CoV-2 viral copies this assay can detect is 250 copies / mL. A negative result does not preclude SARS-CoV-2 infection and should not be used as the sole basis for treatment or other patient management decisions.  A negative result may occur with improper specimen collection / handling, submission of specimen other than nasopharyngeal swab, presence of viral mutation(s) within the areas targeted by this assay, and inadequate number of viral copies (<250 copies / mL). A negative result must be combined with clinical observations, patient history, and epidemiological information.  Fact Sheet for Patients:   StrictlyIdeas.no  Fact Sheet for Healthcare Providers: BankingDealers.co.za  This test is not yet approved or  cleared by the [place] FDA and has been authorized for detection and/or diagnosis of SARS-CoV-2 by FDA under an Emergency Use Authorization (EUA).  This EUA will remain in effect (meaning this test can be used) for the duration of the COVID-19 declaration under Section 564(b)(1) of the Act, 21 U.S.C. section 360bbb-3(b)(1), unless the authorization is terminated or revoked sooner.  Performed at [facility],  [place] [address], [place], [place] [address]   Urine culture     Status: Abnormal   Collection Time: 11/27/19  2:00 AM   Specimen: Urine, Random  Result Value Ref Range Status  Specimen Description   Final    URINE, RANDOM Performed at [facility], [place] [address], [place], [place] [address]    Special Requests   Final    NONE Performed at [facility], [place] [address] [place], [place] [address]    Culture (A)  Final    <10,000 COLONIES/mL INSIGNIFICANT GROWTH Performed at [place] [address], [place], [place] [address]    Report Status 11/28/2019 FINAL  Final  Blood culture (routine x 2)     Status: None (Preliminary result)   Collection Time: 11/27/19  2:00 AM   Specimen: BLOOD  Result Value Ref Range Status   Specimen Description   Final    BLOOD LEFT ANTECUBITAL Performed at [place] [address] [place], [place] [address]    Special Requests   Final    BOTTLES DRAWN AEROBIC AND ANAEROBIC Blood Culture results may not be optimal due to an excessive volume of blood received in culture bottles Performed at [place] [address] [place], [place] [address]    Culture   Final    NO GROWTH 1 DAY Performed at [facility], [place] [address], [place], [place] [address]    Report Status PENDING  Incomplete  Blood culture (routine x 2)     Status: None (Preliminary result)   Collection Time: 11/27/19  2:00 AM   Specimen: BLOOD  Result Value Ref Range Status   Specimen Description   Final    BLOOD BLOOD RIGHT HAND Performed at [place] [address], [place], [place] [address]    Special Requests   Final    BOTTLES DRAWN AEROBIC AND ANAEROBIC Blood Culture adequate volume Performed at [place] [address] [place], [place] [address]    Culture   Final    NO GROWTH 1 DAY Performed at [facility], [place] [address] [place], [place] [address]    Report Status PENDING  Incomplete         Radiology Studies: DG Chest 2 View  Result Date: 11/26/2019 CLINICAL DATA:  Cough.  Back pain after lifting heavy object today. EXAM: CHEST - 2 VIEW COMPARISON:  None. FINDINGS: Heart size and pulmonary vascularity are normal. Lungs are clear. No pleural effusions. No pneumothorax. Mediastinal contours appear intact. Degenerative changes in the spine. IMPRESSION: No active cardiopulmonary disease. Electronically Signed   By: Lucienne Capers M.D.   On: 11/26/2019 23:46   DG Thoracic Spine 2 View  Result Date: 11/26/2019 CLINICAL DATA:  Back pain after lifting heavy object today. EXAM: THORACIC SPINE 2 VIEWS COMPARISON:  None. FINDINGS: Normal alignment of the thoracic spine. No vertebral compression deformities. Degenerative changes with disc space narrowing and endplate hypertrophic changes mostly in the midthoracic region. Bone cortex appears intact. No paraspinal soft tissue swelling. IMPRESSION: Degenerative changes in the thoracic spine. No acute displaced fractures identified. Electronically Signed   By: Lucienne Capers M.D.   On: 11/26/2019 23:43   DG Lumbar Spine Complete  Result Date: 11/26/2019 CLINICAL DATA:  Back pain after lifting a heavy object today. EXAM: LUMBAR SPINE - COMPLETE 4+ VIEW COMPARISON:  None. FINDINGS: Five lumbar type vertebral bodies. Normal alignment. No vertebral compression deformities. Mild degenerative changes with slight disc space narrowing and endplate osteophyte formation. Sclerosis and prominent degenerative changes in the posterior elements at L5-S1 on the left. This may indicate spondylolysis. No spondylolisthesis. Visualized  sacrum appears intact. IMPRESSION: 1. No acute displaced fractures identified. 2. Possible spondylolysis at L5-S1 on the left with asymmetric prominence of degenerative changes in the posterior elements. Electronically Signed   By: Lucienne Capers M.D.    On: 11/26/2019 23:45   CT Head Wo Contrast  Result Date: 11/27/2019 CLINICAL DATA:  Worsening headache, immunodeficiency, fever, previous sinus and nasal surgery for MRSA infection. EXAM: CT HEAD WITHOUT CONTRAST TECHNIQUE: Contiguous axial images were obtained from the base of the skull through the vertex without intravenous contrast. COMPARISON:  None. FINDINGS: Brain: No evidence of acute infarction, hemorrhage, hydrocephalus, extra-axial collection or mass lesion/mass effect. Vascular: No hyperdense vessel or unexpected calcification. Skull: Normal. Negative for fracture or focal lesion. Sinuses/Orbits: Air-fluid level in the sphenoid sinus likely sinusitis. Mucosal thickening in the paranasal sinuses otherwise. Postoperative changes with partial left turbinate tectum ease and resection of left ethmoid septations. Mastoid air cells are clear. Other: None. IMPRESSION: 1. No acute intracranial abnormalities. 2. Air-fluid level in the sphenoid sinus likely sinusitis. Electronically Signed   By: Lucienne Capers M.D.   On: 11/27/2019 03:06   CT Cervical Spine Wo Contrast  Result Date: 11/26/2019 CLINICAL DATA:  Status post trauma. EXAM: CT CERVICAL SPINE WITHOUT CONTRAST TECHNIQUE: Multidetector CT imaging of the cervical spine was performed without intravenous contrast. Multiplanar CT image reconstructions were also generated. COMPARISON:  None. FINDINGS: Alignment: Normal. Skull base and vertebrae: No acute fracture. No primary bone lesion or focal pathologic process. Soft tissues and spinal canal: No prevertebral fluid or swelling. No visible canal hematoma. Disc levels: Mild to moderate severity endplate sclerosis is seen at the levels of C5-C6 and C6-C7. Mild intervertebral disc space narrowing is also seen at these levels. Mild to moderate severity bilateral multilevel facet joint hypertrophy is noted. Upper chest: A 6 mm bone island is seen within the paraspinal region of the third left rib. Other:  There is moderate severity sphenoid sinus mucosal thickening. IMPRESSION: 1. No acute fracture or subluxation of the cervical spine. 2. Mild to moderate severity degenerative changes at the levels of C5-C6 and C6-C7. 3. Moderate severity sphenoid sinus mucosal thickening. Electronically Signed   By: Virgina Norfolk M.D.   On: 11/26/2019 21:14   MR Brain W and Wo Contrast  Result Date: 11/27/2019 CLINICAL DATA:  Sinusitis, headaches. EXAM: MRI HEAD WITHOUT AND WITH CONTRAST TECHNIQUE: Multiplanar, multiecho pulse sequences of the brain and surrounding structures were obtained without and with intravenous contrast. CONTRAST:  104m GADAVIST GADOBUTROL 1 MMOL/ML IV SOLN COMPARISON:  11/27/2019 head CT. FINDINGS: Brain: Sequela of tiny remote anterior left centrum semiovale insult. No intracranial hemorrhage. No diffusion-weighted signal abnormality. No midline shift, ventriculomegaly or extra-axial fluid collection. No mass lesion. No abnormal enhancement. Vascular: Normal flow voids. Skull and upper cervical spine: Normal marrow signal. No abnormal enhancement. Sinuses/Orbits: Normal orbits. Postsurgical appearance of the paranasal sinuses. There is moderate mucosal thickening involving the left frontal, ethmoid and sphenoid sinuses. Minimal to mild mucosal thickening involving the right frontal and bilateral maxillary sinuses. Layering left frontal and sphenoid sinus secretions likely reflect active inflammation. Trace right mastoid effusion. Other: None. IMPRESSION: 1. No acute intracranial abnormality. 2. Sequela of tiny remote anterior left centrum semiovale insult. 3. Postsurgical appearance of the paranasal sinuses. Pansinus disease with layering left frontal and sphenoid sinus secretions reflecting active inflammation. No evidence of orbital or intracranial involvement. 4. Trace right mastoid effusion. Electronically Signed   By: CPrimitivo GauzeM.D.   On: 11/27/2019 12:42  MR Cervical Spine W or Wo  Contrast  Result Date: 11/27/2019 CLINICAL DATA:  Severe headaches and total spine pain, history of sinusitis with MRSA infections EXAM: MRI CERVICAL, THORACIC AND LUMBAR SPINE WITHOUT AND WITH CONTRAST TECHNIQUE: Multiplanar and multiecho pulse sequences of the cervical spine, to include the craniocervical junction and cervicothoracic junction, and thoracic and lumbar spine, were obtained without and with intravenous contrast. COMPARISON:  None. FINDINGS: MRI CERVICAL SPINE Alignment: Anteroposterior alignment is maintained. Vertebrae: Vertebral body heights are preserved. Cord: No abnormal signal.  No abnormal intrathecal enhancement. Posterior Fossa, vertebral arteries, paraspinal tissues: Unremarkable. Disc levels: C2-C3:  Mild facet hypertrophy.  No canal or foraminal stenosis. C3-C4: Mild uncovertebral hypertrophy. Moderate left facet hypertrophy. No canal or right foraminal stenosis. Mild left foraminal stenosis. C4-C5: Disc bulge with endplate osteophytes slightly eccentric to the left. No canal or right foraminal stenosis. Mild to moderate left foraminal stenosis. C5-C6: Disc bulge with endplate osteophytes. Uncovertebral hypertrophy. Mild canal stenosis. Mild foraminal stenosis. C6-C7: Disc bulge with endplate osteophytes. No canal or right foraminal stenosis. Minor left foraminal stenosis. C7-T1:  No canal or foraminal stenosis. MRI THORACIC SPINE Alignment:  Anteroposterior alignment is maintained. Vertebrae: There is no marrow edema.  No suspicious osseous lesion. Cord: No abnormal cord signal. No abnormal intrathecal enhancement. Paraspinal and other soft tissues: Left basilar atelectasis/consolidation. Disc levels: Minor degenerative disc disease is present. For example, there is a small right paracentral disc protrusion at T2-T3. There is facet hypertrophy, greatest at T3-T4 and T4-T5 on the right. MRI LUMBAR SPINE Segmentation:  Standard. Alignment:  Anteroposterior alignment is maintained.  Vertebrae: Vertebral body heights are preserved. There is no marrow edema. There is no suspicious osseous lesion. Conus medullaris and cauda equina: Conus extends to the L1 level. Conus and cauda equina appear normal. No abnormal intrathecal enhancement. Paraspinal and other soft tissues: Unremarkable. Disc levels: There is no significant canal stenosis at any level. Marked left facet arthropathy with hypertrophic changes present at L5-S1. Results in mild narrowing of the left L5-S1 foramen. IMPRESSION: No significant abnormality identified. Mild degenerative changes are present. Electronically Signed   By: Macy Mis M.D.   On: 11/27/2019 13:03   MR THORACIC SPINE W WO CONTRAST  Result Date: 11/27/2019 CLINICAL DATA:  Severe headaches and total spine pain, history of sinusitis with MRSA infections EXAM: MRI CERVICAL, THORACIC AND LUMBAR SPINE WITHOUT AND WITH CONTRAST TECHNIQUE: Multiplanar and multiecho pulse sequences of the cervical spine, to include the craniocervical junction and cervicothoracic junction, and thoracic and lumbar spine, were obtained without and with intravenous contrast. COMPARISON:  None. FINDINGS: MRI CERVICAL SPINE Alignment: Anteroposterior alignment is maintained. Vertebrae: Vertebral body heights are preserved. Cord: No abnormal signal.  No abnormal intrathecal enhancement. Posterior Fossa, vertebral arteries, paraspinal tissues: Unremarkable. Disc levels: C2-C3:  Mild facet hypertrophy.  No canal or foraminal stenosis. C3-C4: Mild uncovertebral hypertrophy. Moderate left facet hypertrophy. No canal or right foraminal stenosis. Mild left foraminal stenosis. C4-C5: Disc bulge with endplate osteophytes slightly eccentric to the left. No canal or right foraminal stenosis. Mild to moderate left foraminal stenosis. C5-C6: Disc bulge with endplate osteophytes. Uncovertebral hypertrophy. Mild canal stenosis. Mild foraminal stenosis. C6-C7: Disc bulge with endplate osteophytes. No canal  or right foraminal stenosis. Minor left foraminal stenosis. C7-T1:  No canal or foraminal stenosis. MRI THORACIC SPINE Alignment:  Anteroposterior alignment is maintained. Vertebrae: There is no marrow edema.  No suspicious osseous lesion. Cord: No abnormal cord signal. No abnormal intrathecal enhancement. Paraspinal and other soft  tissues: Left basilar atelectasis/consolidation. Disc levels: Minor degenerative disc disease is present. For example, there is a small right paracentral disc protrusion at T2-T3. There is facet hypertrophy, greatest at T3-T4 and T4-T5 on the right. MRI LUMBAR SPINE Segmentation:  Standard. Alignment:  Anteroposterior alignment is maintained. Vertebrae: Vertebral body heights are preserved. There is no marrow edema. There is no suspicious osseous lesion. Conus medullaris and cauda equina: Conus extends to the L1 level. Conus and cauda equina appear normal. No abnormal intrathecal enhancement. Paraspinal and other soft tissues: Unremarkable. Disc levels: There is no significant canal stenosis at any level. Marked left facet arthropathy with hypertrophic changes present at L5-S1. Results in mild narrowing of the left L5-S1 foramen. IMPRESSION: No significant abnormality identified. Mild degenerative changes are present. Electronically Signed   By: Macy Mis M.D.   On: 11/27/2019 13:03   MR Lumbar Spine W Wo Contrast  Result Date: 11/27/2019 CLINICAL DATA:  Severe headaches and total spine pain, history of sinusitis with MRSA infections EXAM: MRI CERVICAL, THORACIC AND LUMBAR SPINE WITHOUT AND WITH CONTRAST TECHNIQUE: Multiplanar and multiecho pulse sequences of the cervical spine, to include the craniocervical junction and cervicothoracic junction, and thoracic and lumbar spine, were obtained without and with intravenous contrast. COMPARISON:  None. FINDINGS: MRI CERVICAL SPINE Alignment: Anteroposterior alignment is maintained. Vertebrae: Vertebral body heights are preserved. Cord:  No abnormal signal.  No abnormal intrathecal enhancement. Posterior Fossa, vertebral arteries, paraspinal tissues: Unremarkable. Disc levels: C2-C3:  Mild facet hypertrophy.  No canal or foraminal stenosis. C3-C4: Mild uncovertebral hypertrophy. Moderate left facet hypertrophy. No canal or right foraminal stenosis. Mild left foraminal stenosis. C4-C5: Disc bulge with endplate osteophytes slightly eccentric to the left. No canal or right foraminal stenosis. Mild to moderate left foraminal stenosis. C5-C6: Disc bulge with endplate osteophytes. Uncovertebral hypertrophy. Mild canal stenosis. Mild foraminal stenosis. C6-C7: Disc bulge with endplate osteophytes. No canal or right foraminal stenosis. Minor left foraminal stenosis. C7-T1:  No canal or foraminal stenosis. MRI THORACIC SPINE Alignment:  Anteroposterior alignment is maintained. Vertebrae: There is no marrow edema.  No suspicious osseous lesion. Cord: No abnormal cord signal. No abnormal intrathecal enhancement. Paraspinal and other soft tissues: Left basilar atelectasis/consolidation. Disc levels: Minor degenerative disc disease is present. For example, there is a small right paracentral disc protrusion at T2-T3. There is facet hypertrophy, greatest at T3-T4 and T4-T5 on the right. MRI LUMBAR SPINE Segmentation:  Standard. Alignment:  Anteroposterior alignment is maintained. Vertebrae: Vertebral body heights are preserved. There is no marrow edema. There is no suspicious osseous lesion. Conus medullaris and cauda equina: Conus extends to the L1 level. Conus and cauda equina appear normal. No abnormal intrathecal enhancement. Paraspinal and other soft tissues: Unremarkable. Disc levels: There is no significant canal stenosis at any level. Marked left facet arthropathy with hypertrophic changes present at L5-S1. Results in mild narrowing of the left L5-S1 foramen. IMPRESSION: No significant abnormality identified. Mild degenerative changes are present.  Electronically Signed   By: Macy Mis M.D.   On: 11/27/2019 13:03        Scheduled Meds: . amoxicillin-clavulanate  1 tablet Oral Q12H  . atorvastatin  80 mg Oral Daily  . fluticasone  1 spray Each Nare BID  . fluticasone furoate-vilanterol  1 puff Inhalation Daily   And  . umeclidinium bromide  1 puff Inhalation Daily  . gabapentin  1,200 mg Oral BID  . insulin aspart  0-15 Units Subcutaneous TID WC  . insulin aspart  0-5 Units Subcutaneous  QHS  . montelukast  10 mg Oral Daily  . nortriptyline  25 mg Oral QHS  . pantoprazole (PROTONIX) IV  40 mg Intravenous Q12H  . senna  1 tablet Oral BID  . sodium chloride flush  3 mL Intravenous Q12H   Continuous Infusions: . sodium chloride    . methocarbamol (ROBAXIN) IV Stopped (11/27/19 2325)  . ondansetron (ZOFRAN) IV    . sodium bicarbonate 150 mEq in dextrose 5% 1000 mL    . sodium chloride       LOS: 0 days    Time spent: 40 minutes    Irine Seal, MD [facility]   To contact the attending provider between 7A-7P or the covering provider during after hours 7P-7A, please log into the web site [email or web address] and access using universal [place] password for that web site. If you do not have the password, please call the hospital operator.  11/28/2019, 11:43 AM

## 2019-11-28 NOTE — Progress Notes (Signed)
OT Cancellation Note  Patient Details Name: Bryan Lopez MRN: [id number] DOB: [birth year]   Cancelled Treatment:    Reason Eval/Treat Not Completed: Patient declined, no reason specified  Pt politely declined OT visit this morning due to back pain and nausea.  Pt apologetic and agreed for OT to attempt later in afternoon.  Checked back in PM and pt noted to be leaving floor in WC.  Will continue efforts tomorrow as schedule allows.   Theodoro Clock 11/28/2019, 2:54 PM

## 2019-11-29 ENCOUNTER — Inpatient Hospital Stay (HOSPITAL_COMMUNITY): Payer: BC Managed Care – PPO

## 2019-11-29 DIAGNOSIS — E119 Type 2 diabetes mellitus without complications: Secondary | ICD-10-CM | POA: Diagnosis not present

## 2019-11-29 DIAGNOSIS — R112 Nausea with vomiting, unspecified: Secondary | ICD-10-CM | POA: Diagnosis not present

## 2019-11-29 DIAGNOSIS — R509 Fever, unspecified: Secondary | ICD-10-CM | POA: Diagnosis not present

## 2019-11-29 DIAGNOSIS — M545 Low back pain: Secondary | ICD-10-CM | POA: Diagnosis not present

## 2019-11-29 LAB — CBC WITH DIFFERENTIAL/PLATELET
Abs Immature Granulocytes: 0.11 10*3/uL — ABNORMAL HIGH (ref 0.00–0.07)
Basophils Absolute: 0 10*3/uL (ref 0.0–0.1)
Basophils Relative: 0 %
Eosinophils Absolute: 0 10*3/uL (ref 0.0–0.5)
Eosinophils Relative: 0 %
HCT: 37.6 % — ABNORMAL LOW (ref 39.0–52.0)
Hemoglobin: 12.4 g/dL — ABNORMAL LOW (ref 13.0–17.0)
Immature Granulocytes: 1 %
Lymphocytes Relative: 9 %
Lymphs Abs: 0.8 10*3/uL (ref 0.7–4.0)
MCH: 29.2 pg (ref 26.0–34.0)
MCHC: 33 g/dL (ref 30.0–36.0)
MCV: 88.7 fL (ref 80.0–100.0)
Monocytes Absolute: 1.1 10*3/uL — ABNORMAL HIGH (ref 0.1–1.0)
Monocytes Relative: 11 %
Neutro Abs: 7.4 10*3/uL (ref 1.7–7.7)
Neutrophils Relative %: 79 %
Platelets: 314 10*3/uL (ref 150–400)
RBC: 4.24 MIL/uL (ref 4.22–5.81)
RDW: 13.2 % (ref 11.5–15.5)
WBC: 9.4 10*3/uL (ref 4.0–10.5)
nRBC: 0 % (ref 0.0–0.2)

## 2019-11-29 LAB — BASIC METABOLIC PANEL
Anion gap: 13 (ref 5–15)
BUN: 12 mg/dL (ref 8–23)
CO2: 19 mmol/L — ABNORMAL LOW (ref 22–32)
Calcium: 8.6 mg/dL — ABNORMAL LOW (ref 8.9–10.3)
Chloride: 99 mmol/L (ref 98–111)
Creatinine, Ser: 0.92 mg/dL (ref 0.61–1.24)
GFR calc Af Amer: >60 mL/min (ref >60)
GFR calc non Af Amer: >60 mL/min (ref >60)
Glucose, Bld: 151 mg/dL — ABNORMAL HIGH (ref 70–99)
Potassium: 4 mmol/L (ref 3.5–5.1)
Sodium: 131 mmol/L — ABNORMAL LOW (ref 135–145)

## 2019-11-29 LAB — GLUCOSE, CAPILLARY
Glucose-Capillary: 150 mg/dL — ABNORMAL HIGH (ref 70–99)
Glucose-Capillary: 122 mg/dL — ABNORMAL HIGH (ref 70–99)
Glucose-Capillary: 166 mg/dL — ABNORMAL HIGH (ref 70–99)
Glucose-Capillary: 156 mg/dL — ABNORMAL HIGH (ref 70–99)

## 2019-11-29 LAB — URINE CULTURE: Culture: NO GROWTH

## 2019-11-29 IMAGING — CT CT VENOGRAM HEAD
3 of 9 series · 17 of 47 positions shown · IV contrast (omnipaque)
Comparison: Brain MRI [DATE].

CLINICAL DATA: Headache, chronic, new features or increased
frequency. Additional provided: Patient reports severe headache,
nausea and vomiting, posterior neck pain. Prior history of MRSA
sinusitis which read to spine and into brain, being treated
intermittently with antibiotics over the past year.

EXAM:
CT VENOGRAM HEAD
TECHNIQUE: Contiguous axial images were obtained from the base of the skull
through the vertex without intravenous contrast. Subsequently, CT
venography of the head was performed. Coronal and sagittal
reconstructions were submitted for evaluation. Additionally, axial,
coronal and sagittal thick MIP reconstructions were also submitted.
CONTRAST:  75mL OMNIPAQUE IOHEXOL 350 MG/ML SOLN

[Series 4: cor soft · coronal · 0.33mm/px · 2 of 65 slices shown]
[im 22/65  brain]
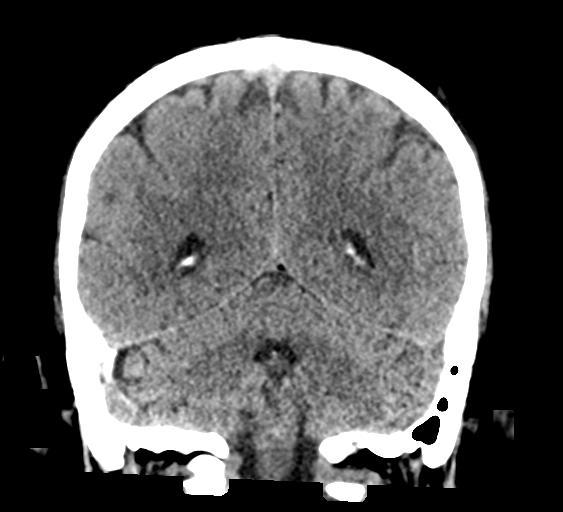
[im 43/65  brain]
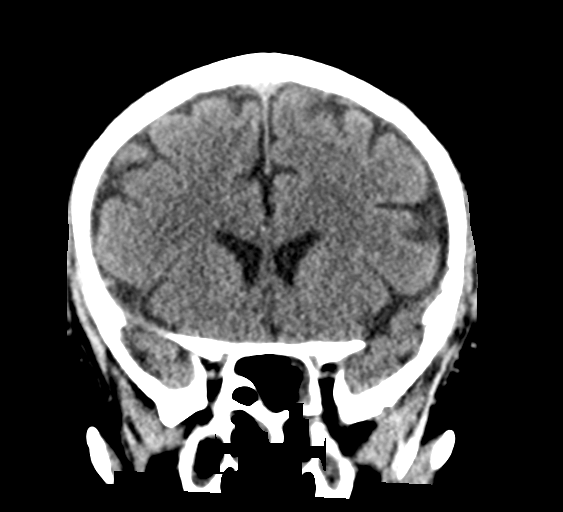

[Series 5: sag soft · sagittal · 0.33mm/px · 1 of 52 slices shown]
[im 30/52  brain]
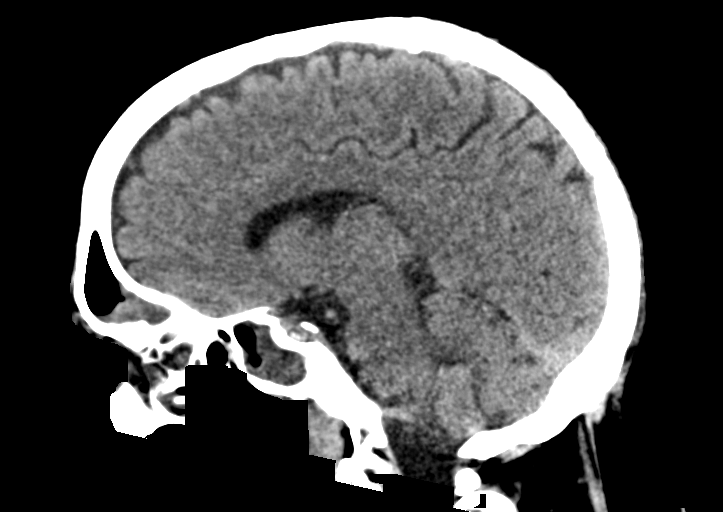

[Series 6: head venogram · axial · 0.48mm/px · z∈[-121,+21]mm · 14 of 81 slices shown]
[im 5/81  brain]
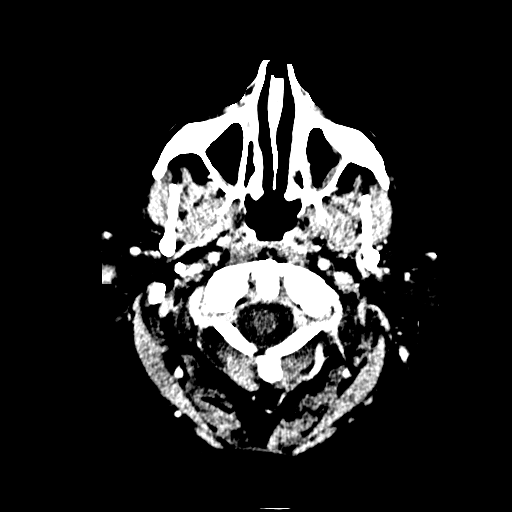
[im 10/81  bone]
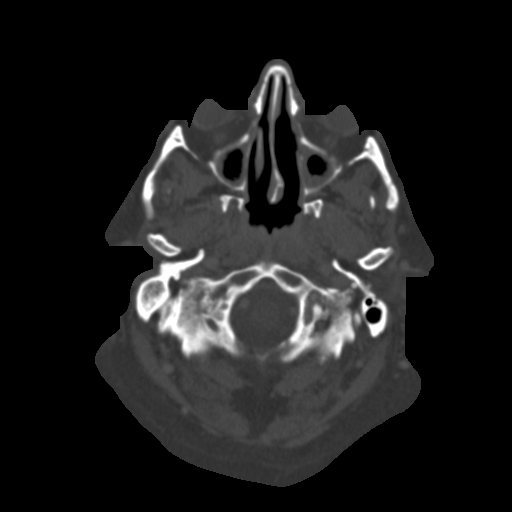
[im 15/81  brain]
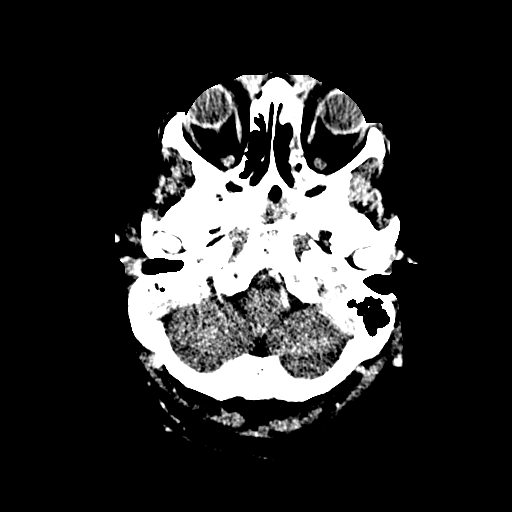
[im 24/81  bone]
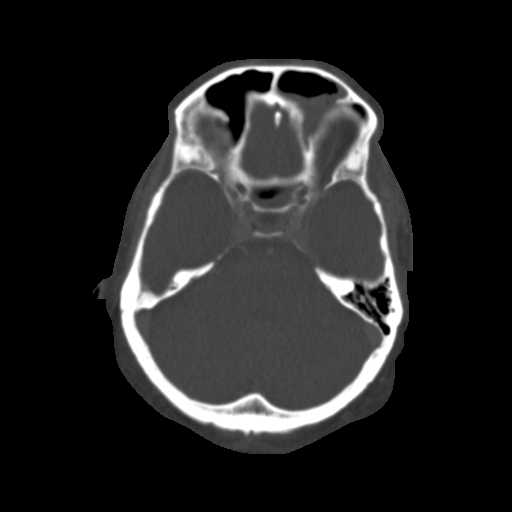
[im 29/81  brain]
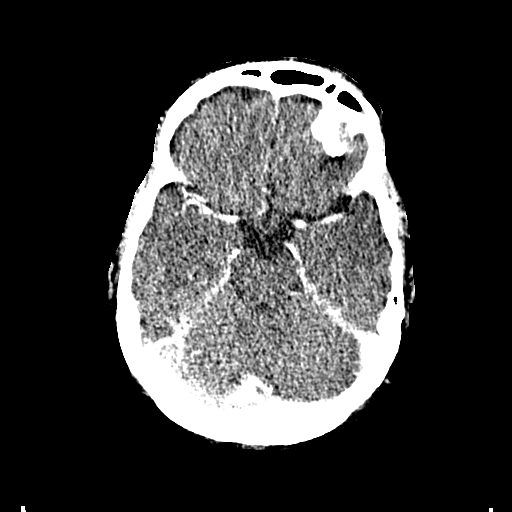
[im 33/81  bone]
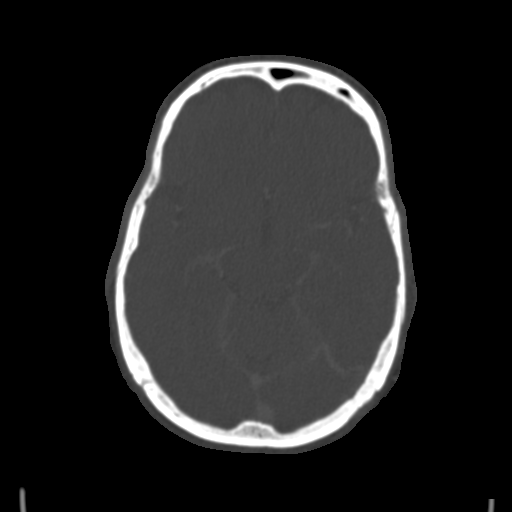
[im 38/81  brain]
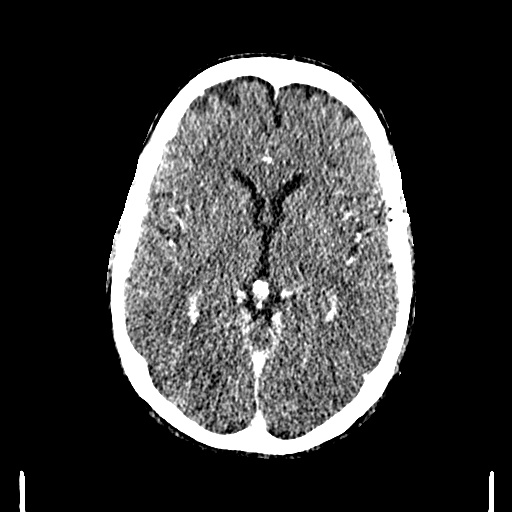
[im 43/81  bone]
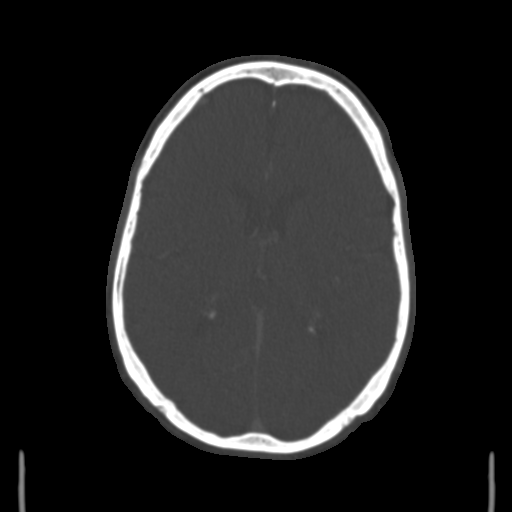
[im 48/81  brain]
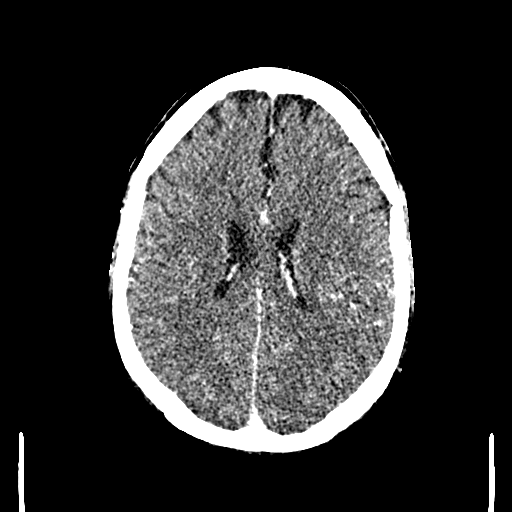
[im 52/81  bone]
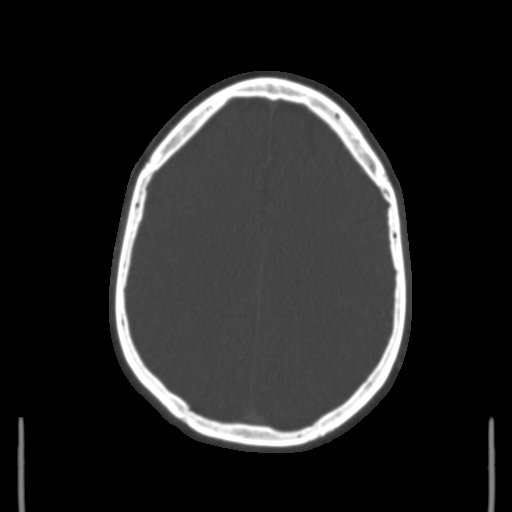
[im 62/81  brain]
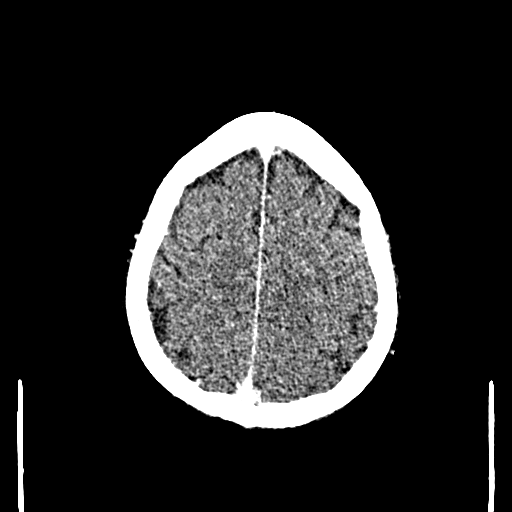
[im 66/81  bone]
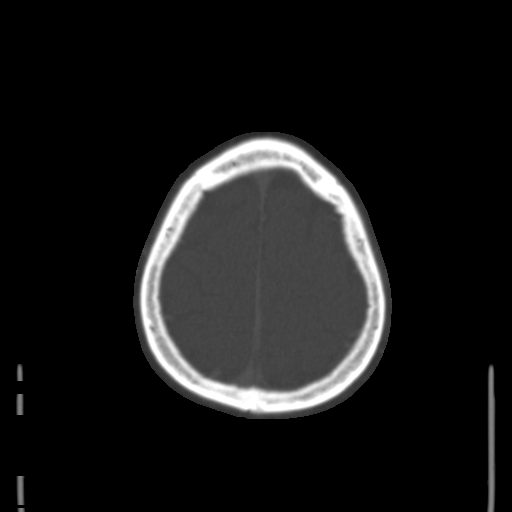
[im 71/81  brain]
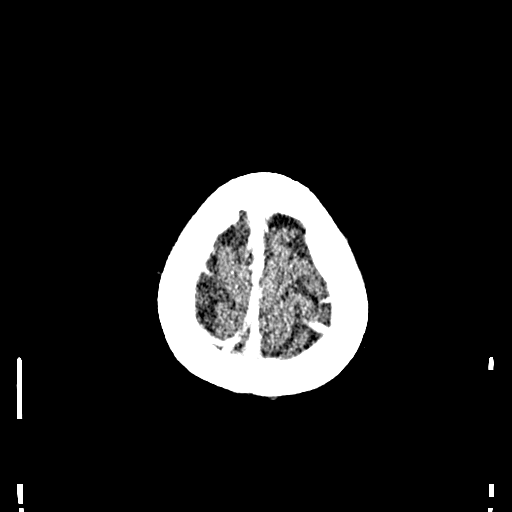
[im 76/81  bone]
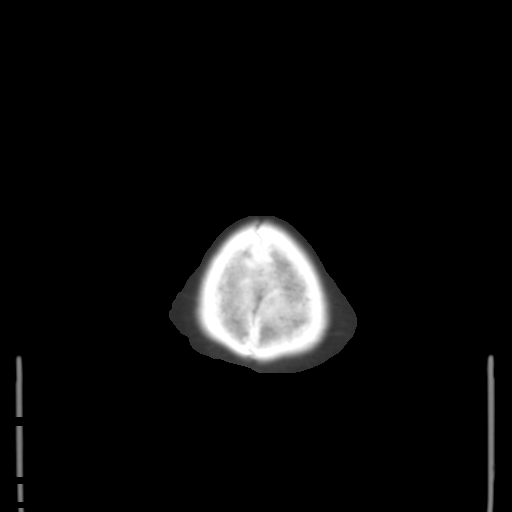

[17 of 47 positions shown; findings below may reference images not displayed]

FINDINGS: NON-CONTRAST CT HEAD FINDINGS:

Brain:

Stable, mild generalized parenchymal atrophy.

There is no acute intracranial hemorrhage.

No demarcated cortical infarct is identified.

No extra-axial fluid collection.

No evidence of intracranial mass.

No midline shift.

Vascular: No hyperdense vessel is demonstrated on pre-contrast
imaging.

Skull: No calvarial fracture or suspicious calvarial lesion.

Sinuses/Orbits: Visualized orbits show no acute finding.
Redemonstrated postsurgical appearance of the paranasal sinuses.
There is extensive partial opacification of the left frontal sinus
with a large air-fluid level. Moderate mucosal thickening within the
left ethmoidectomy cavity. Moderate mucosal thickening versus
surgical flap within the dependent aspect of the midline
sphenoidectomy cavity. Moderate mucosal thickening and frothy
secretions within both maxillary sinuses.

Other: Small right mastoid effusion.

CT VENOGRAM FINDINGS:

The superior sagittal sinus, internal cerebral veins, vein of KASHMIR,
straight sinus, transverse sinuses, sigmoid sinuses and visualized
jugular veins are patent. There is no appreciable intracranial
venous thrombosis.
IMPRESSION: CT head:

1. No evidence of acute intracranial abnormality.
2. Stable, mild generalized parenchymal atrophy.
3. Redemonstrated postsurgical changes of the paranasal sinuses with
evidence of sinusitis as follows. Extensive partial opacification of
the left frontal sinus with a large air-fluid level. Moderate
mucosal thickening and frothy secretions within both maxillary
sinuses. Moderate mucosal thickening versus surgical flap within the
dependent aspect of the sphenoidectomy cavity. Moderate mucosal
thickening within the left ethmoidectomy cavity.
4. Small right mastoid effusion.

CT venography:

No evidence of intracranial venous thrombosis.

## 2019-11-29 MED ORDER — SODIUM CHLORIDE (PF) 0.9 % IJ SOLN
INTRAMUSCULAR | Status: AC
  Filled 2019-11-29: qty 50

## 2019-11-29 MED ORDER — METOPROLOL TARTRATE 5 MG/5ML IV SOLN
5.0000 mg | Freq: Once | INTRAVENOUS | Status: AC
  Administered 2019-11-29: 5 mg via INTRAVENOUS
  Filled 2019-11-29: qty 5

## 2019-11-29 MED ORDER — BISACODYL 10 MG RE SUPP
10.0000 mg | Freq: Every day | RECTAL | Status: DC
  Filled 2019-11-29 (×2): qty 1

## 2019-11-29 MED ORDER — KETOROLAC TROMETHAMINE 30 MG/ML IJ SOLN
30.0000 mg | Freq: Four times a day (QID) | INTRAMUSCULAR | Status: DC | PRN
  Administered 2019-11-29 – 2019-11-30 (×3): 30 mg via INTRAVENOUS
  Filled 2019-11-29 (×3): qty 1

## 2019-11-29 MED ORDER — IOHEXOL 350 MG/ML SOLN
75.0000 mL | Freq: Once | INTRAVENOUS | Status: AC | PRN
  Administered 2019-11-29: 75 mL via INTRAVENOUS

## 2019-11-29 MED ORDER — SODIUM BICARBONATE 8.4 % IV SOLN
INTRAVENOUS | Status: DC
  Filled 2019-11-29 (×3): qty 150

## 2019-11-29 MED ORDER — MAGNESIUM CITRATE PO SOLN
1.0000 | Freq: Once | ORAL | Status: AC
  Administered 2019-11-29: 1 via ORAL
  Filled 2019-11-29: qty 296

## 2019-11-29 MED ORDER — SODIUM CHLORIDE 0.9 % IV BOLUS
500.0000 mL | Freq: Once | INTRAVENOUS | Status: AC
  Administered 2019-11-29: 500 mL via INTRAVENOUS

## 2019-11-29 MED ORDER — PROCHLORPERAZINE EDISYLATE 10 MG/2ML IJ SOLN
10.0000 mg | Freq: Once | INTRAMUSCULAR | Status: AC
  Administered 2019-11-29: 10 mg via INTRAVENOUS

## 2019-11-29 NOTE — Progress Notes (Addendum)
PROGRESS NOTE    Bryan Lopez  SRP:[id number] DOB: Jan 07, 1961 DOA: 11/26/2019 PCP: Patient, [name]    Chief Complaint  Patient presents with  . Back Pain    Brief Narrative:  Bryan Lopez is a 58 y.o. male with a past medical history of diabetes mellitus type 2 on insulin, history of sinusitis, history of asthma, who apparently recently moved here from [place].  He has been lifting a lot of heavy objects over the past couple of months and has noticed some back pain.  Yesterday he lifted some heavy boxes and experienced severe pain in his back.  Transiently he felt some numbness in both his fingers.  Currently he is experiencing severe back pain in his left lower back radiating down to his left leg.  Denies any red flag symptoms such as bowel or bladder incontinence.  He mentions that he is also experienced some chills at home and felt feverish although he did not check his temperature.  Denies any weakness per se in his legs.  Denies any shortness of breath cough.  He is unvaccinated for Covid.  Denies any dysuria.  No diarrhea.  He did travel to [place] last week.  Currently the pain in the back is about 8 out of 10 in intensity.  He is also nauseated.  In the emergency department patient underwent plain films of his back which did not show any acute findings.  Due to fever, elevated CRP, MRI has been ordered and is still pending   Assessment & Plan:   Active Problems:   History of frequent upper respiratory infection   Other allergic rhinitis   SIRS (systemic inflammatory response syndrome) (HCC)   Acute back pain   Fever   DM (diabetes mellitus), type 2 (HCC)   Emesis   Headache   Acute recurrent sinusitis  1 acute lower back pain/headache/nausea and vomiting Patient noted to have nausea and vomiting over the past 2 days.  Patient still with complaints of significant headache. Still with complaints of lower back pain.  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  Patient with low-grade temp of 100.9.  Patient with a slight leukocytosis white count of 10.9.  Complains of headache.  Blood cultures pending with no growth to date.  Patient on was on Augmentin and has been changed to IV Unasyn.  Abdominal films consistent with moderate stool burden.  Chest x-ray no acute infiltrate.  Patient underwent LP on 11/28/2019 which was negative for meningitis.  CT head venogram done this morning and pending.  CT head negative on admission.  MRI with no acute abnormalities aside from acute sinusitis noted.  Continue IV Unasyn.  We will give a dose of IV Compazine and IV Toradol.  If patient with ongoing intractable headache with negative CT venogram will have neurology assess patient for further evaluation and recommendations.  2.  Fever/SIRS Questionable etiology.  Patient on presentation met criteria for systemic inflammatory response with tachycardia and fever.  Fever curve trending down.  Urinalysis unremarkable.  Chest x-ray unremarkable.  COVID-19 PCR test was negative.  MRI of the L-spine, C-spine, T-spine, brain with no significant abnormalities except for sinusitis.  Patient with complaints of headache, nausea vomiting and ongoing lower back pain.  See problem #1.  Patient underwent LP 11/28/2019 with no organisms seen.  Negative for meningitis.  Patient was on  Augmentin which has been discontinued and patient currently on IV Unasyn.  Continue supportive care.   3.  Metabolic acidosis Questionable etiology.  Likely secondary to ongoing nausea vomiting low back pain.  Patient has been pancultured.  Patient with a history of diabetes however blood glucose levels within normal limits.  Repeat UA nitrite negative, leukocytes negative, 0-5 WBC.  Acidosis improving on bicarb drip.  Follow.    4.  Diabetes mellitus type 2 Patient noted to be on [place], [place],  [place].  Hemoglobin A1c of 7.9.  Patient with a blood glucose level of 150 this morning.  Continue to hold oral hypoglycemic agents.  Sliding scale insulin.  5.  Hyponatremia Likely secondary to hypovolemic hyponatremia patient stated had nausea and vomiting.  Urine osmolality 671.  Urine sodium 114.  TSH at 1.109.  IV fluids.  Supportive care.    6.  Coronary artery disease Patient mentioned to admitting physician that he had a balloon angioplasty done several years ago.  No heart issues since then.  Patient not on antiplatelet therapy.  Patient not on a beta-blocker.  Patient noted to be on an ARB which is currently on hold due to low blood pressure.  Follow.  7.  Acute sinusitis Noted on MRI of the brain.  Patient noted to have some facial pain/congestion.  Patient started on Augmentin however patient with ongoing nausea and emesis and as such Augmentin discontinued and patient on IV Unasyn.  Patient still with some nausea and emesis today.  Continue IV Unasyn.  Supportive care.  Follow.  8.  Constipation Abdominal films with moderate stool burden.  Patient noted not to have had any bowel movement for the past 4 to 5 days.  Patient with nausea and emesis.  Enema ordered however patient refused this morning however as agreed to have enema.  Follow.   DVT prophylaxis: SCDs Code Status: Full Family Communication: Updated patient.  No family at bedside. Disposition:   Status is: Inpatient    Dispo: The patient is from: Home              Anticipated d/c is to: Home              Anticipated d/c date is: To be determined.              Patient currently with complaints of significant headache and back pain with nausea and emesis .  Not medically stable for discharge.       Consultants:   IR for LP.  Procedures:   CT head 10/11/2019  CT C-spine 11/26/2019  Chest x-ray 11/28/2019 pending,  Abdominal films 11/28/2019 pending  Chest x-ray 11/26/2019  Plain films of the L-spine and  T-spine 11/26/2019  MRI brain MRI C-spine 11/27/2019  MRI L/MRI T-spine 11/27/2018  LP 11/28/2019  CT head venogram pending 11/29/2019  Antimicrobials:   Amoxicillin 11/27/2019>>>> 11/28/2019  IV Unasyn 11/28/2019   Subjective: Patient in bed holding his head.  Complaining of headache.  Complaining of worsening back pain after returning from CT venogram.  Complains of nausea and emesis.  No bowel movement yet.  Refused enema this morning per RN.  Objective: Vitals:   11/29/19 0023 11/29/19 0149 11/29/19 0512 11/29/19 1122  BP: 130/66 121/65 138/76 116/76  Pulse: (!) 125 (!) 106 (!) 105 (!) 104  Resp: 20 19 17 17   Temp: 99.4 F (37.4 C) 99.8 F (37.7 C) 99.1 F (37.3 C) 99.1 F (37.3 C)  TempSrc: Oral Oral Oral Oral  SpO2: 93% 93% 90% 95%  Weight:      Height:        Intake/Output Summary (Last 24 hours) at 11/29/2019 1242 Last data filed at 11/29/2019 1123 Gross per 24 hour  Intake 2694.42 ml  Output 4875 ml  Net -2180.58 ml   Filed Weights   11/27/19 0346  Weight: 86.2 kg    Examination:  General exam: NAD Respiratory system: Coarse diffuse breath sounds.  No wheezing.  Normal respiratory effort.   Cardiovascular system: RRR no murmurs rubs or gallops.  No JVD.  No lower extremity edema. Gastrointestinal system: Abdomen is nontender, nondistended, soft, positive bowel sounds.  No rebound.  No guarding.  Central nervous system: Alert and oriented. No focal neurological deficits. Extremities: Symmetric 5 x 5 power. Skin: No rashes, lesions or ulcers Psychiatry: Judgement and insight appear normal. Mood & affect appropriate.     Data Reviewed: I have personally reviewed following labs and imaging studies  CBC: Recent Labs  Lab 11/26/19 2359 11/28/19 0538 11/29/19 0600  WBC 10.8* 10.9* 9.4  NEUTROABS 8.7* 8.9* 7.4  HGB 13.1 13.4 12.4*  HCT 40.2 41.7 37.6*  MCV 90.1 91.6 88.7  PLT 289 328 242    Basic Metabolic Panel: Recent Labs  Lab 11/26/19 2359  11/28/19 0538 11/28/19 0905 11/29/19 0600  NA 132* 130* 131* 131*  K 4.0 4.6 4.8 4.0  CL 98 100 101 99  CO2 18* 13* 11* 19*  GLUCOSE 93 91 107* 151*  BUN 14 17 18 12   CREATININE 1.01 0.94 1.05 0.92  CALCIUM 9.2 9.1 8.8* 8.6*    GFR: Estimated Creatinine Clearance: 90 mL/min (by C-G formula based on SCr of 0.92 mg/dL).  Liver Function Tests: Recent Labs  Lab 11/26/19 2359 11/28/19 0538  AST 18 17  ALT 19 18  ALKPHOS 124 109  BILITOT 2.1* 1.8*  PROT 8.5* 7.6  ALBUMIN 4.4 3.8    CBG: Recent Labs  Lab 11/28/19 1118 11/28/19 1712 11/28/19 2106 11/29/19 0738 11/29/19 1119  GLUCAP 91 155* 146* 150* 122*     Recent Results (from the past 240 hour(s))  SARS Coronavirus 2 by RT PCR (hospital order, performed in [facility] hospital lab) Nasopharyngeal Nasopharyngeal Swab     Status: None   Collection Time: 11/26/19 11:59 PM   Specimen: Nasopharyngeal Swab  Result Value Ref Range Status   SARS Coronavirus 2 NEGATIVE NEGATIVE Final    Comment: (NOTE) SARS-CoV-2 target nucleic acids are NOT DETECTED.  The SARS-CoV-2 RNA is generally detectable in upper and lower respiratory specimens during the acute phase of infection. The lowest concentration of SARS-CoV-2 viral copies this assay can detect is 250 copies / mL. A negative result does not preclude SARS-CoV-2 infection and should not be used as the sole basis for treatment or other patient management decisions.  A negative result may occur with improper specimen collection / handling, submission of specimen other than nasopharyngeal swab, presence of viral mutation(s) within the areas targeted by this assay, and inadequate number of viral copies (<250 copies / mL). A negative result must be combined with clinical observations, patient history, and epidemiological information.  Fact Sheet for Patients:   StrictlyIdeas.no  Fact Sheet for Healthcare  Providers: BankingDealers.co.za  This test is not yet approved or  cleared by the [place] FDA and has been authorized for detection and/or diagnosis of SARS-CoV-2 by FDA under an Emergency Use Authorization (EUA).  This EUA will remain in effect (meaning this test can be  used) for the duration of the COVID-19 declaration under Section 564(b)(1) of the Act, 21 U.S.C. section 360bbb-3(b)(1), unless the authorization is terminated or revoked sooner.  Performed at [facility], [place] [address] [place], [place] [address]   Urine culture     Status: Abnormal   Collection Time: 11/27/19  2:00 AM   Specimen: Urine, Random  Result Value Ref Range Status   Specimen Description   Final    URINE, RANDOM Performed at [facility] [address], [place], [place] [address]    Special Requests   Final    NONE Performed at [facility], [place] [address], [place], [place] [address]    Culture (A)  Final    <10,000 COLONIES/mL INSIGNIFICANT GROWTH Performed at [place] [address] [place], [place] [address]    Report Status 11/28/2019 FINAL  Final  Blood culture (routine x 2)     Status: None (Preliminary result)   Collection Time: 11/27/19  2:00 AM   Specimen: BLOOD  Result Value Ref Range Status   Specimen Description   Final    BLOOD LEFT ANTECUBITAL Performed at [place] [address], [place], [place] [address]    Special Requests   Final    BOTTLES DRAWN AEROBIC AND ANAEROBIC Blood Culture results may not be optimal due to an excessive volume of blood received in culture bottles Performed at [place] [address], [place], [place] [address]    Culture   Final    NO GROWTH 2 DAYS Performed at [place] [address] [place], [place] [address]    Report Status PENDING  Incomplete  Blood culture (routine x 2)     Status: None  (Preliminary result)   Collection Time: 11/27/19  2:00 AM   Specimen: BLOOD  Result Value Ref Range Status   Specimen Description   Final    BLOOD BLOOD RIGHT HAND Performed at [place] [address] [place], [place] [address]    Special Requests   Final    BOTTLES DRAWN AEROBIC AND ANAEROBIC Blood Culture adequate volume Performed at [facility] [address], [place], [place] [address]    Culture   Final    NO GROWTH 2 DAYS Performed at [facility] [address], [place], [place] [address]    Report Status PENDING  Incomplete  CSF culture     Status: None (Preliminary result)   Collection Time: 11/28/19  4:30 PM   Specimen: PATH Cytology CSF; Cerebrospinal Fluid  Result Value Ref Range Status   Specimen Description   Final    CSF Performed at [facility] [address] [place], [place] [address]    Special Requests   Final    NONE Performed at [facility], [place] [address], [place], [place] [address]    Gram Stain   Final    NO ORGANISMS SEEN WBC PRESENT, PREDOMINANTLY MONONUCLEAR CYTOSPIN SMEAR NOTIFIED GORRELL,S RN @1836  ON 11/28/19 JACK[place],K    Culture   Final    NO GROWTH < 12 HOURS Performed at [facility] [address] [place],  [address]    Report Status PENDING  Incomplete  Culture, fungus without smear     Status: None (Preliminary result)   Collection Time: 11/28/19  4:30 PM   Specimen: PATH Cytology CSF; Cerebrospinal Fluid  Result Value  Ref Range Status   Specimen Description   Final    CSF Performed at [facility] [address], [place], [place] [address]    Special Requests   Final    NONE Performed at [facility], [place] [address] [place], [place] [address]    Culture   Final    NO FUNGUS ISOLATED AFTER 1 DAY Performed at [facility], [place] [address], [place], [place] [address]    Report  Status PENDING  Incomplete         Radiology Studies: DG Chest 2 View  Result Date: 11/28/2019 CLINICAL DATA:  Patient came in 2 days ago for back pain. Now with nausea, vomiting and fever. EXAM: CHEST - 2 VIEW COMPARISON:  11/26/2019 FINDINGS: The heart size and mediastinal contours are within normal limits. Both lungs are clear. The visualized skeletal structures are unremarkable. IMPRESSION: No active cardiopulmonary disease. Electronically Signed   By: Nolon Nations M.D.   On: 11/28/2019 14:12   DG Abd 2 Views  Result Date: 11/28/2019 CLINICAL DATA:  Nausea, vomiting, fever. EXAM: ABDOMEN - 2 VIEW COMPARISON:  None. FINDINGS: Bowel gas pattern is nonobstructed. Moderate stool burden. No evidence for organomegaly. No abnormal calcifications. Degenerative changes are seen in the lumbosacral spine. IMPRESSION: Moderate stool burden. Electronically Signed   By: Nolon Nations M.D.   On: 11/28/2019 14:12   DG FLUORO GUIDE LUMBAR PUNCTURE  Result Date: 11/28/2019 CLINICAL DATA:  Severe headache.  Negative spinal MRI. EXAM: DIAGNOSTIC LUMBAR PUNCTURE UNDER FLUOROSCOPIC GUIDANCE FLUOROSCOPY TIME:  Radiation Exposure Index (as provided by the fluoroscopic device): 27.9 mGy If the device does not provide the exposure index: Fluoroscopy Time (in minutes and seconds):  1 minutes 18 seconds Number of Acquired Images:  1 PROCEDURE: Informed consent was obtained from the patient prior to the procedure, including potential complications of headache, allergy, and pain. With the patient prone, the lower back was prepped with Betadine. 1% Lidocaine was used for local anesthesia. Lumbar puncture was performed at the L5-S1 level using a 20 gauge needle with return of clear CSF with an opening pressure of 23 cm water. Nine ml of CSF were obtained for laboratory studies. The patient tolerated the procedure well and there were no apparent complications. IMPRESSION: Successful lumbar puncture. Electronically Signed    By: Suzy Bouchard M.D.   On: 11/28/2019 17:17        Scheduled Meds: . atorvastatin  80 mg Oral Daily  . fluticasone  1 spray Each Nare BID  . fluticasone furoate-vilanterol  1 puff Inhalation Daily   And  . umeclidinium bromide  1 puff Inhalation Daily  . gabapentin  1,200 mg Oral BID  . insulin aspart  0-15 Units Subcutaneous TID WC  . insulin aspart  0-5 Units Subcutaneous QHS  . montelukast  10 mg Oral Daily  . nortriptyline  25 mg Oral QHS  . pantoprazole (PROTONIX) IV  40 mg Intravenous Q12H  . prochlorperazine  10 mg Intravenous Once  . senna  1 tablet Oral BID  . sodium chloride flush  3 mL Intravenous Q12H   Continuous Infusions: . sodium chloride    . ampicillin-sulbactam (UNASYN) IV 3 g (11/29/19 0847)  . methocarbamol (ROBAXIN) IV Stopped (11/27/19 2325)  . ondansetron (ZOFRAN) IV    . sodium bicarbonate 150 mEq in dextrose 5% 1000 mL 150 mEq (11/29/19 1049)     LOS: 1 day    Time spent: 40 minutes  Irine Seal, MD [facility]   To contact the attending provider between 7A-7P or the covering provider during after hours 7P-7A, please log into the web site [email or web address] and access using universal [place] password for that web site. If you do not have the password, please call the hospital operator.  11/29/2019, 12:42 PM

## 2019-11-29 NOTE — Evaluation (Signed)
Physical Therapy Evaluation Patient Details Name: Bryan Lopez MRN: [id number] DOB: 19-Apr-1961 Today's Date: 11/29/2019   History of Present Illness  59 y.o. male with a past medical history of diabetes mellitus type 2 on insulin, history of sinusitis, history of asthma, who apparently recently moved here from [place]  He has been lifting a lot of heavy objects over the past couple of months and has noticed some back pain. admitted iwth nausea, vomiting, URI/sinusitis  Clinical Impression  Pt admitted with above diagnosis.  Pt agreeable to EOB with encouragement,  Mobility limited by pt c/o pain and fatigue. Encouraged pt to begin some gentle low back stretches and mobilize as tolerated. He has not been OOB since admission. Would likely benefit from OPPT at d/c  Pt currently with functional limitations due to the deficits listed below (see PT Problem List). Pt will benefit from skilled PT to increase their independence and safety with mobility to allow discharge to the venue listed below.       Follow Up Recommendations Outpatient PT    Equipment Recommendations  Other (comment) (TBD)    Recommendations for Other Services       Precautions / Restrictions Precautions Precautions: Fall Restrictions Weight Bearing Restrictions: No      Mobility  Bed Mobility Overal bed mobility: Needs Assistance Bed Mobility: Supine to Sit;Sit to Supine     Supine to sit: Supervision;HOB elevated Sit to supine: Supervision;HOB elevated   General bed mobility comments: HOB elevated per pt request, partial roll for transition to sitting, supervision for safety, incr time  Transfers Overall transfer level: Needs assistance   Transfers: Sit to/from Stand Sit to Stand: Min guard;Supervision         General transfer comment: for safety and lines. HR up to 118 in standing. lateral steps along EOB  Ambulation/Gait             General Gait Details: unable/declines d/t fatigue and  pain  Stairs            Wheelchair Mobility    Modified Rankin (Stroke Patients Only)       Balance Overall balance assessment: Mild deficits observed, not formally tested                                           Pertinent Vitals/Pain Pain Assessment: Faces Faces Pain Scale: Hurts even more Pain Location: back Pain Descriptors / Indicators: Discomfort;Grimacing;Sore Pain Intervention(s): Limited activity within patient's tolerance;Monitored during session;Repositioned    Home Living Family/patient expects to be discharged to:: Private residence Living Arrangements: Other relatives (lives with "disabled brother")   Type of Home: House Home Access: Stairs to enter   Secretary/administrator of Steps: does not specify Home Layout: One level Home Equipment: None      Prior Function Level of Independence: Independent               Hand Dominance        Extremity/Trunk Assessment   Upper Extremity Assessment Upper Extremity Assessment: Overall WFL for tasks assessed    Lower Extremity Assessment Lower Extremity Assessment: Overall WFL for tasks assessed       Communication   Communication: No difficulties  Cognition Arousal/Alertness: Awake/alert Behavior During Therapy: WFL for tasks assessed/performed Overall Cognitive Status: Within Functional Limits for tasks assessed  General Comments      Exercises Other Exercises Other Exercises: reviewed single knee to chest Other Exercises: knee rolls R/L x2   Assessment/Plan    PT Assessment Patient needs continued PT services  PT Problem List Decreased mobility;Decreased activity tolerance;Pain       PT Treatment Interventions DME instruction;Therapeutic exercise;Gait training;Functional mobility training;Patient/family education    PT Goals (Current goals can be found in the Care Plan section)  Acute Rehab PT  Goals Patient Stated Goal: get some sleep and have less pain PT Goal Formulation: With patient Time For Goal Achievement: 12/13/19 Potential to Achieve Goals: Good    Frequency Min 2X/week   Barriers to discharge        Co-evaluation               AM-PAC PT "6 Clicks" Mobility  Outcome Measure Help needed turning from your back to your side while in a flat bed without using bedrails?: A Little Help needed moving from lying on your back to sitting on the side of a flat bed without using bedrails?: A Little Help needed moving to and from a bed to a chair (including a wheelchair)?: A Little Help needed standing up from a chair using your arms (e.g., wheelchair or bedside chair)?: A Little Help needed to walk in hospital room?: A Little Help needed climbing 3-5 steps with a railing? : A Little 6 Click Score: 18    End of Session   Activity Tolerance: Patient limited by fatigue;Patient limited by pain Patient left: in bed;with call bell/phone within reach;with bed alarm set   PT Visit Diagnosis: Difficulty in walking, not elsewhere classified (R26.2);Other abnormalities of gait and mobility (R26.89);Pain Pain - part of body:  (back)    Time: 4132-4401 PT Time Calculation (min) (ACUTE ONLY): 12 min   Charges:   PT Evaluation $PT Eval Low Complexity: 1 Low          Ceciley Buist, PT  Acute Rehab Dept ([facility]) [phone] Pager [phone]  11/29/2019   [facility] 11/29/2019, 1:48 PM

## 2019-11-29 NOTE — Progress Notes (Signed)
OT Cancellation Note  Patient Details Name: Kolyn Rozario MRN: [id number] DOB: [birth year]   Cancelled Treatment:    Reason Eval/Treat Not Completed: Pain limiting ability to participate  RN aware  Will check on pt later this day or next day  Lise Auer, [place] [facility] [phone] Office- [phone]    Netanya Yazdani, Karin Golden D 11/29/2019, 12:51 PM

## 2019-11-29 NOTE — Progress Notes (Addendum)
Attempted to give the patient the soap and suds enema twice, but refused it both times. Pt stated that he was hoping he would be able to go himself. MD made aware.

## 2019-11-29 NOTE — Progress Notes (Signed)
Pt once again asks if enema can be done later. Will check with him again before shift change and report off to oncoming nurse.

## 2019-11-30 DIAGNOSIS — M545 Low back pain: Secondary | ICD-10-CM | POA: Diagnosis not present

## 2019-11-30 DIAGNOSIS — R509 Fever, unspecified: Secondary | ICD-10-CM | POA: Diagnosis not present

## 2019-11-30 DIAGNOSIS — R112 Nausea with vomiting, unspecified: Secondary | ICD-10-CM | POA: Diagnosis not present

## 2019-11-30 DIAGNOSIS — E119 Type 2 diabetes mellitus without complications: Secondary | ICD-10-CM | POA: Diagnosis not present

## 2019-11-30 LAB — MAGNESIUM: Magnesium: 2.6 mg/dL — ABNORMAL HIGH (ref 1.7–2.4)

## 2019-11-30 LAB — VDRL, CSF: VDRL Quant, CSF: NONREACTIVE

## 2019-11-30 LAB — CBC WITH DIFFERENTIAL/PLATELET
Abs Immature Granulocytes: 0.05 10*3/uL (ref 0.00–0.07)
Basophils Absolute: 0 10*3/uL (ref 0.0–0.1)
Basophils Relative: 1 %
Eosinophils Absolute: 0.1 10*3/uL (ref 0.0–0.5)
Eosinophils Relative: 2 %
HCT: 34.8 % — ABNORMAL LOW (ref 39.0–52.0)
Hemoglobin: 11.7 g/dL — ABNORMAL LOW (ref 13.0–17.0)
Immature Granulocytes: 1 %
Lymphocytes Relative: 17 %
Lymphs Abs: 1 10*3/uL (ref 0.7–4.0)
MCH: 29.2 pg (ref 26.0–34.0)
MCHC: 33.6 g/dL (ref 30.0–36.0)
MCV: 86.8 fL (ref 80.0–100.0)
Monocytes Absolute: 0.6 10*3/uL (ref 0.1–1.0)
Monocytes Relative: 10 %
Neutro Abs: 4.2 10*3/uL (ref 1.7–7.7)
Neutrophils Relative %: 69 %
Platelets: 321 10*3/uL (ref 150–400)
RBC: 4.01 MIL/uL — ABNORMAL LOW (ref 4.22–5.81)
RDW: 13 % (ref 11.5–15.5)
WBC: 6 10*3/uL (ref 4.0–10.5)
nRBC: 0 % (ref 0.0–0.2)

## 2019-11-30 LAB — BASIC METABOLIC PANEL
Anion gap: 12 (ref 5–15)
BUN: 13 mg/dL (ref 8–23)
CO2: 30 mmol/L (ref 22–32)
Calcium: 8.3 mg/dL — ABNORMAL LOW (ref 8.9–10.3)
Chloride: 94 mmol/L — ABNORMAL LOW (ref 98–111)
Creatinine, Ser: 0.74 mg/dL (ref 0.61–1.24)
GFR calc Af Amer: >60 mL/min (ref >60)
GFR calc non Af Amer: >60 mL/min (ref >60)
Glucose, Bld: 137 mg/dL — ABNORMAL HIGH (ref 70–99)
Potassium: 2.9 mmol/L — ABNORMAL LOW (ref 3.5–5.1)
Sodium: 136 mmol/L (ref 135–145)

## 2019-11-30 LAB — GLUCOSE, CAPILLARY
Glucose-Capillary: 122 mg/dL — ABNORMAL HIGH (ref 70–99)
Glucose-Capillary: 110 mg/dL — ABNORMAL HIGH (ref 70–99)
Glucose-Capillary: 227 mg/dL — ABNORMAL HIGH (ref 70–99)
Glucose-Capillary: 155 mg/dL — ABNORMAL HIGH (ref 70–99)

## 2019-11-30 MED ORDER — METHOCARBAMOL 500 MG PO TABS
500.0000 mg | ORAL_TABLET | Freq: Four times a day (QID) | ORAL | Status: DC | PRN
  Administered 2019-11-30: 500 mg via ORAL
  Filled 2019-11-30: qty 1

## 2019-11-30 MED ORDER — POTASSIUM CHLORIDE CRYS ER 20 MEQ PO TBCR
40.0000 meq | EXTENDED_RELEASE_TABLET | ORAL | Status: AC
  Administered 2019-11-30 (×2): 40 meq via ORAL
  Filled 2019-11-30 (×2): qty 2

## 2019-11-30 MED ORDER — MAGNESIUM CITRATE PO SOLN
1.0000 | Freq: Once | ORAL | Status: DC
  Filled 2019-11-30: qty 296

## 2019-11-30 MED ORDER — PANTOPRAZOLE SODIUM 40 MG PO TBEC
40.0000 mg | DELAYED_RELEASE_TABLET | Freq: Two times a day (BID) | ORAL | Status: DC
  Administered 2019-11-30 – 2019-12-02 (×4): 40 mg via ORAL
  Filled 2019-11-30 (×4): qty 1

## 2019-11-30 MED ORDER — GUAIFENESIN ER 600 MG PO TB12
1200.0000 mg | ORAL_TABLET | Freq: Two times a day (BID) | ORAL | Status: DC
  Administered 2019-11-30 – 2019-12-02 (×4): 1200 mg via ORAL
  Filled 2019-11-30 (×5): qty 2

## 2019-11-30 MED ORDER — IBUPROFEN 200 MG PO TABS
600.0000 mg | ORAL_TABLET | Freq: Three times a day (TID) | ORAL | Status: DC
  Administered 2019-11-30 – 2019-12-02 (×6): 600 mg via ORAL
  Filled 2019-11-30 (×6): qty 3

## 2019-11-30 MED ORDER — SODIUM CHLORIDE 0.9 % IV SOLN: INTRAVENOUS | Status: DC

## 2019-11-30 MED ORDER — POLYETHYLENE GLYCOL 3350 17 G PO PACK
17.0000 g | PACK | Freq: Two times a day (BID) | ORAL | Status: DC
  Administered 2019-11-30 – 2019-12-01 (×2): 17 g via ORAL
  Filled 2019-11-30 (×5): qty 1

## 2019-11-30 NOTE — TOC Initial Note (Signed)
Transition of Care [facility]) - Initial/Assessment Note    Patient Details  Name: Bryan Lopez MRN: [id number] Date of Birth: 24-Apr-1960  Transition of Care (TOC) CM/SW Contact:    Armanda Heritage, RN Phone Number: 11/30/2019, 1:17 PM  Clinical Narrative:    CM noted recommendation for outpatient PT. Referral placed.                Expected Discharge Plan: Home/Self Care Barriers to Discharge: Continued Medical Work up   Patient Goals and CMS Choice Patient states their goals for this hospitalization and ongoing recovery are:: to go home      Expected Discharge Plan and Services Expected Discharge Plan: Home/Self Care   Discharge Planning Services: CM Consult   Living arrangements for the past 2 months: Single Family Home                 DME Arranged: N/A DME Agency: NA       HH Arranged: NA HH Agency: NA        Prior Living Arrangements/Services Living arrangements for the past 2 months: Single Family Home   Patient language and need for interpreter reviewed:: Yes Do you feel safe going back to the place where you live?: Yes      Need for Family Participation in Patient Care: Yes (Comment) Care giver support system in place?: Yes (comment)   Criminal Activity/Legal Involvement Pertinent to Current Situation/Hospitalization: No - Comment as needed  Activities of Daily Living Home Assistive Devices/Equipment: CBG Meter, Eyeglasses ADL Screening (condition at time of admission) Patient's cognitive ability adequate to safely complete daily activities?: Yes Is the patient deaf or have difficulty hearing?: No Does the patient have difficulty seeing, even when wearing glasses/contacts?: No Does the patient have difficulty concentrating, remembering, or making decisions?: No Patient able to express need for assistance with ADLs?: Yes Does the patient have difficulty dressing or bathing?: No Independently performs ADLs?: Yes (appropriate for developmental age) Does  the patient have difficulty walking or climbing stairs?: No Weakness of Legs: None Weakness of Arms/Hands: None  Permission Sought/Granted                  Emotional Assessment Appearance:: Appears stated age Attitude/Demeanor/Rapport: Engaged Affect (typically observed): Accepting     Psych Involvement: No (comment)  Admission diagnosis:  Viral illness [B34.9] SIRS (systemic inflammatory response syndrome) (HCC) [R65.10] Acute left-sided low back pain with left-sided sciatica [M54.42] Patient Active Problem List   Diagnosis Date Noted  . Emesis   . Headache   . Acute recurrent sinusitis   . SIRS (systemic inflammatory response syndrome) (HCC) 11/27/2019  . Acute back pain 11/27/2019  . Fever 11/27/2019  . DM (diabetes mellitus), type 2 (HCC) 11/27/2019  . History of frequent upper respiratory infection 10/07/2019  . Other allergic rhinitis 10/07/2019  . Urticaria 10/07/2019  . Not well controlled severe persistent asthma 10/06/2019   PCP:  Patient, [name] Pharmacy:   [id number] [place], [place] - [address] AT [facility] [address] [place] [address] Phone: [phone] Fax: [phone]  [id number], [place] - [address] AT [facility] [address] [address] [facility] [address] [facility] [address] Phone: [phone] Fax: [phone]     Social Determinants of Health (SDOH) Interventions    Readmission Risk Interventions No flowsheet data found.

## 2019-11-30 NOTE — Therapy (Signed)
OT Cancellation Note  Patient Details Name: Bryan Lopez MRN: [id number] DOB: 1960/11/15   Cancelled Treatment:     New orders for bedrest and laying flat in bed.  Attempted to contact MD to clarify but have not yet heard back.  Will hold for now and continue to clarify orders.   Theodoro Clock 11/30/2019, 3:04 PM

## 2019-11-30 NOTE — Progress Notes (Signed)
PROGRESS NOTE    Bryan Lopez  PZW:[id number] DOB: 1960-08-14 DOA: 11/26/2019 PCP: Patient, [name]    Chief Complaint  Patient presents with  . Back Pain    Brief Narrative:  Bryan Lopez is a 59 y.o. male with a past medical history of diabetes mellitus type 2 on insulin, history of sinusitis, history of asthma, who apparently recently moved here from [place].  He has been lifting a lot of heavy objects over the past couple of months and has noticed some back pain.  Yesterday he lifted some heavy boxes and experienced severe pain in his back.  Transiently he felt some numbness in both his fingers.  Currently he is experiencing severe back pain in his left lower back radiating down to his left leg.  Denies any red flag symptoms such as bowel or bladder incontinence.  He mentions that he is also experienced some chills at home and felt feverish although he did not check his temperature.  Denies any weakness per se in his legs.  Denies any shortness of breath cough.  He is unvaccinated for Covid.  Denies any dysuria.  No diarrhea.  He did travel to [place] last week.  Currently the pain in the back is about 8 out of 10 in intensity.  He is also nauseated.  In the emergency department patient underwent plain films of his back which did not show any acute findings.  Due to fever, elevated CRP, MRI has been ordered and is still pending   Assessment & Plan:   Active Problems:   History of frequent upper respiratory infection   Other allergic rhinitis   SIRS (systemic inflammatory response syndrome) (HCC)   Acute back pain   Fever   DM (diabetes mellitus), type 2 (HCC)   Emesis   Headache   Acute recurrent sinusitis  1 acute lower back pain/headache/nausea and vomiting Patient noted to have nausea and vomiting over the past 2 days.  Patient states some improvement with headache however still significant.  Still with complaints of lower back pain which is slowly improving.  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  Fever curve trending down.  Leukocytosis has improved.  Blood cultures pending with no growth to date.  Patient on was on Augmentin and has been changed to IV Unasyn.  Abdominal films consistent with moderate stool burden.  Chest x-ray no acute infiltrate.  Patient underwent LP on 11/28/2019 which was negative for meningitis.  CT head venogram done with no evidence of intracranial venous thrombosis.  CT head venogram with acute sinusitis.  CT head negative on admission.  MRI with no acute abnormalities aside from acute sinusitis noted.  Continue IV Unasyn.  IV Compazine, IV Toradol as needed headache and back pain.  Place on scheduled ibuprofen 600 mg 3 times daily.  Will need outpatient follow-up with ENT, PCP with referral likely to orthopedics for back pain.  Supportive care.   2.  Fever/SIRS Questionable etiology.  Likely secondary to acute sinusitis.  Patient on presentation met criteria for systemic inflammatory response with tachycardia and fever.  Fever curve trending down.  Urinalysis unremarkable.  Chest x-ray unremarkable.  COVID-19 PCR test was negative.  MRI of the L-spine, C-spine, T-spine, brain with no significant abnormalities except for sinusitis.  Patient with complaints of headache, nausea vomiting and ongoing lower back pain.  See  problem #1.  Patient underwent LP 11/28/2019 with no organisms seen.  Negative for meningitis.  CT head venogram with no source of fever noted on the aside from sinusitis.  Patient was on Augmentin which has been discontinued and patient currently on IV Unasyn.  Once patient's nausea and emesis improved we will transition back from IV Unasyn to Augmentin.  Continue supportive care.   3.  Metabolic acidosis Questionable etiology.  Likely secondary to ongoing nausea vomiting low back pain.  Patient has been  pancultured.  Patient with a history of diabetes however blood glucose levels within normal limits.  Repeat UA nitrite negative, leukocytes negative, 0-5 WBC.  Acidosis improving on bicarb drip.  DC bicarb drip.  Follow.    4.  Diabetes mellitus type 2 Patient noted to be on [place], [place], [place].  Hemoglobin A1c of 7.9.  Patient with a blood glucose level of 122 this morning.  Continue to hold oral hypoglycemic agents.  Sliding scale insulin.  5.  Hyponatremia Likely secondary to hypovolemic hyponatremia patient stated had nausea and vomiting.  Urine osmolality 671.  Urine sodium 114.  TSH at 1.109.  Hyponatremia improving.  Continue IV fluids.  Supportive care.    6.  Coronary artery disease Patient mentioned to admitting physician that he had a balloon angioplasty done several years ago.  No heart issues since then.  Patient not on antiplatelet therapy.  Patient not on a beta-blocker.  Patient noted to be on an ARB which is currently on hold due to low blood pressure.  Follow.  7.  Acute sinusitis Noted on MRI of the brain.  Patient noted to have some facial pain/congestion.  Patient started on Augmentin however patient with ongoing nausea and emesis and as such Augmentin discontinued and patient on IV Unasyn.  CT venogram head redemonstrated postsurgical changes of the paranasal sinuses with evidence of sinusitis, extensive partial opacification of the left frontal sinus with large air-fluid level, moderate mucosal thickening and frothy secretions within both maxillary sinuses, moderate mucosal thickening versus surgical flap within the dependent aspect of the sphenoidectomy cavity.  Moderate mucosal thickening within the left ethmoidectomy cavity.  Small right mastoid effusion.  Patient with some improvement with nausea and emesis.  Continue IV Unasyn and if continued improvement could likely transition to oral Augmentin tomorrow.  Will need to follow-up with ENT in the outpatient setting.   Continue supportive care.  Follow.    8.  Constipation Abdominal films with moderate stool burden.  Patient noted not to have had any bowel movement for the past 4 to 5 days prior to admission.  Some improvement with nausea and emesis.  Patient placed on enema however refused.  Patient given some magnesium citrate overnight with bowel movement.  Continue daily Dulcolax suppository, Mucinex twice daily, Senokot-S twice daily.  9.  Hypokalemia Potassium at 2.9.  Magnesium at 2.6.  K. Dur 40 mEq p.o. every 4 hours x2 doses.   DVT prophylaxis: SCDs Code Status: Full Family Communication: Updated patient.  No family at bedside. Disposition:   Status is: Inpatient    Dispo: The patient is from: Home              Anticipated d/c is to: Home              Anticipated d/c date is: To be determined.              Patient with some improvement with headache, still with some back pain, some nausea.  Had BM.  Currently not medically stable for discharge.        Consultants:   IR for LP.  Procedures:   CT head 10/11/2019  CT C-spine 11/26/2019  Chest x-ray 11/28/2019 pending,  Abdominal films 11/28/2019 pending  Chest x-ray 11/26/2019  Plain films of the L-spine and T-spine 11/26/2019  MRI brain MRI C-spine 11/27/2019  MRI L/MRI T-spine 11/27/2018  LP 11/28/2019  CT head venogram 11/29/2019  Antimicrobials:   Amoxicillin 11/27/2019>>>> 11/28/2019  IV Unasyn 11/28/2019   Subjective: Patient laying in bed.  Some improvement with nausea.  Still with significant back pain however slowly improving.  Patient stated had bowel movement last night and this morning.  Still with headache however slightly better than it was yesterday.  Objective: Vitals:   11/29/19 1122 11/29/19 2123 11/30/19 0501 11/30/19 0832  BP: 116/76 (!) 150/79 118/67   Pulse: (!) 104  (!) 106   Resp: 17 20    Temp: 99.1 F (37.3 C) 98.8 F (37.1 C) 99.2 F (37.3 C)   TempSrc: Oral Oral Oral   SpO2: 95% 92% 94% 95%    Weight:      Height:        Intake/Output Summary (Last 24 hours) at 11/30/2019 1131 Last data filed at 11/30/2019 1022 Gross per 24 hour  Intake 1643 ml  Output 1176 ml  Net 467 ml   Filed Weights   11/27/19 0346  Weight: 86.2 kg    Examination:  General exam: NAD Respiratory system: Scattered coarse breath sounds.  No wheezing.  Normal respiratory effort.  Speaking in full sentences.  Cardiovascular system: Regular rate and rhythm no murmurs rubs or gallops.  No JVD.  No lower extremity edema. Gastrointestinal system: Abdomen is soft, nontender, nondistended, positive bowel sounds.  No rebound.  No guarding.  Central nervous system: Alert and oriented. No focal neurological deficits. Extremities: Symmetric 5 x 5 power. Skin: No rashes, lesions or ulcers Psychiatry: Judgement and insight appear normal. Mood & affect appropriate.     Data Reviewed: I have personally reviewed following labs and imaging studies  CBC: Recent Labs  Lab 11/26/19 2359 11/28/19 0538 11/29/19 0600 11/30/19 0453  WBC 10.8* 10.9* 9.4 6.0  NEUTROABS 8.7* 8.9* 7.4 4.2  HGB 13.1 13.4 12.4* 11.7*  HCT 40.2 41.7 37.6* 34.8*  MCV 90.1 91.6 88.7 86.8  PLT 289 328 314 703    Basic Metabolic Panel: Recent Labs  Lab 11/26/19 2359 11/28/19 0538 11/28/19 0905 11/29/19 0600 11/30/19 0453  NA 132* 130* 131* 131* 136  K 4.0 4.6 4.8 4.0 2.9*  CL 98 100 101 99 94*  CO2 18* 13* 11* 19* 30  GLUCOSE 93 91 107* 151* 137*  BUN _0 CREATININE 1.01 0.94 1.05 0.92 0.74  CALCIUM 9.2 9.1 8.8* 8.6* 8.3*  MG  --   --   --   --  2.6*    GFR: Estimated Creatinine Clearance: 103.6 mL/min (by C-G formula based on SCr of 0.74 mg/dL).  Liver Function Tests: Recent Labs  Lab 11/26/19 2359 11/28/19 0538  AST 18 17  ALT 19 18  ALKPHOS 124 109  BILITOT 2.1* 1.8*  PROT 8.5* 7.6  ALBUMIN 4.4 3.8    CBG: Recent Labs  Lab 11/29/19 0738 11/29/19 1119 11/29/19 1623 11/29/19 2121  11/30/19 0816  GLUCAP 150* 122* 166* 156* 122*     Recent Results (from the past 240 hour(s))  SARS Coronavirus 2 by RT PCR (hospital order,  performed in [facility] hospital lab) Nasopharyngeal Nasopharyngeal Swab     Status: None   Collection Time: 11/26/19 11:59 PM   Specimen: Nasopharyngeal Swab  Result Value Ref Range Status   SARS Coronavirus 2 NEGATIVE NEGATIVE Final    Comment: (NOTE) SARS-CoV-2 target nucleic acids are NOT DETECTED.  The SARS-CoV-2 RNA is generally detectable in upper and lower respiratory specimens during the acute phase of infection. The lowest concentration of SARS-CoV-2 viral copies this assay can detect is 250 copies / mL. A negative result does not preclude SARS-CoV-2 infection and should not be used as the sole basis for treatment or other patient management decisions.  A negative result may occur with improper specimen collection / handling, submission of specimen other than nasopharyngeal swab, presence of viral mutation(s) within the areas targeted by this assay, and inadequate number of viral copies (<250 copies / mL). A negative result must be combined with clinical observations, patient history, and epidemiological information.  Fact Sheet for Patients:   StrictlyIdeas.no  Fact Sheet for Healthcare Providers: BankingDealers.co.za  This test is not yet approved or  cleared by the [place] FDA and has been authorized for detection and/or diagnosis of SARS-CoV-2 by FDA under an Emergency Use Authorization (EUA).  This EUA will remain in effect (meaning this test can be used) for the duration of the COVID-19 declaration under Section 564(b)(1) of the Act, 21 U.S.C. section 360bbb-3(b)(1), unless the authorization is terminated or revoked sooner.  Performed at [facility], [place] [address], [place], [place] [address]   Urine culture     Status: Abnormal   Collection  Time: 11/27/19  2:00 AM   Specimen: Urine, Random  Result Value Ref Range Status   Specimen Description   Final    URINE, RANDOM Performed at [place] [address] [place], [place] [address]    Special Requests   Final    NONE Performed at [facility], [place] [address], [place], [place] [address]    Culture (A)  Final    <10,000 COLONIES/mL INSIGNIFICANT GROWTH Performed at [place] [address] [place], [place] [address]    Report Status 11/28/2019 FINAL  Final  Blood culture (routine x 2)     Status: None (Preliminary result)   Collection Time: 11/27/19  2:00 AM   Specimen: BLOOD  Result Value Ref Range Status   Specimen Description   Final    BLOOD LEFT ANTECUBITAL Performed at [place] [address], [place], [place] [address]    Special Requests   Final    BOTTLES DRAWN AEROBIC AND ANAEROBIC Blood Culture results may not be optimal due to an excessive volume of blood received in culture bottles Performed at [facility] [address], [place], [place] [address]    Culture   Final    NO GROWTH 3 DAYS Performed at [facility], [place] [address], [place], [place] [address]    Report Status PENDING  Incomplete  Blood culture (routine x 2)     Status: None (Preliminary result)   Collection Time: 11/27/19  2:00 AM   Specimen: BLOOD  Result Value Ref Range Status   Specimen Description   Final    BLOOD BLOOD RIGHT HAND Performed at [place] [address], [place],  [address]    Special Requests   Final    BOTTLES DRAWN AEROBIC AND ANAEROBIC Blood Culture adequate  volume Performed at [facility], [place] [address] [place], [place] [address]    Culture   Final    NO GROWTH 3 DAYS Performed at [facility], [place] [address], [place], [place] [address]    Report Status PENDING  Incomplete  Culture, Urine      Status: None   Collection Time: 11/28/19 12:22 PM   Specimen: Urine, Random  Result Value Ref Range Status   Specimen Description   Final    URINE, RANDOM Performed at [place] [address], [place], [place] [address]    Special Requests   Final    NONE Performed at [facility], [place] [address] [place], [place] [address]    Culture   Final    NO GROWTH Performed at [facility], [place] [address] [place], [place] [address]    Report Status 11/29/2019 FINAL  Final  Anaerobic culture     Status: None (Preliminary result)   Collection Time: 11/28/19  4:30 PM   Specimen: PATH Cytology CSF; Cerebrospinal Fluid  Result Value Ref Range Status   Specimen Description   Final    CSF Performed at [place] [address], [place], [place] [address]    Special Requests   Final    NONE Performed at [facility], [place] [address], [place], [place] [address]    Culture   Final    NO ANAEROBES ISOLATED; CULTURE IN PROGRESS FOR 5 DAYS   Report Status PENDING  Incomplete  CSF culture     Status: None (Preliminary result)   Collection Time: 11/28/19  4:30 PM   Specimen: PATH Cytology CSF; Cerebrospinal Fluid  Result Value Ref Range Status   Specimen Description   Final    CSF Performed at [facility], [place] [address], [place], [place] [address]    Special Requests   Final    NONE Performed at [facility], [place] [address], [place], [place] [address]    Gram Stain   Final    NO ORGANISMS SEEN WBC PRESENT, PREDOMINANTLY MONONUCLEAR CYTOSPIN SMEAR NOTIFIED GORRELL,S RN _0  ON 11/28/19 JACKSON,K    Culture   Final    NO GROWTH 2 DAYS Performed at [facility], [place] [address], [place], [place] [address]    Report Status PENDING  Incomplete  Culture, fungus without smear     Status: None (Preliminary result)   Collection Time: 11/28/19  4:30 PM    Specimen: PATH Cytology CSF; Cerebrospinal Fluid  Result Value Ref Range Status   Specimen Description   Final    CSF Performed at [place] [address], [place], [place] [address]    Special Requests   Final    NONE Performed at [facility], [place] [address] [place], [place] [address]    Culture   Final    NO FUNGUS ISOLATED AFTER 1 DAY Performed at [facility], [place] [address] [place],  [address]    Report Status PENDING  Incomplete         Radiology Studies: DG Chest 2 View  Result Date: 11/28/2019 CLINICAL DATA:  Patient came in 2 days ago for back pain. Now with nausea, vomiting and fever. EXAM: CHEST - 2 VIEW COMPARISON:  11/26/2019 FINDINGS: The heart size and mediastinal contours are within normal limits. Both lungs are clear. The visualized skeletal structures are unremarkable.  IMPRESSION: No active cardiopulmonary disease. Electronically Signed   By: Nolon Nations M.D.   On: 11/28/2019 14:12   DG Abd 2 Views  Result Date: 11/28/2019 CLINICAL DATA:  Nausea, vomiting, fever. EXAM: ABDOMEN - 2 VIEW COMPARISON:  None. FINDINGS: Bowel gas pattern is nonobstructed. Moderate stool burden. No evidence for organomegaly. No abnormal calcifications. Degenerative changes are seen in the lumbosacral spine. IMPRESSION: Moderate stool burden. Electronically Signed   By: Nolon Nations M.D.   On: 11/28/2019 14:12   CT VENOGRAM HEAD  Result Date: 11/29/2019 CLINICAL DATA:  Headache, chronic, new features or increased frequency. Additional provided: Patient reports severe headache, nausea and vomiting, posterior neck pain. Prior history of MRSA sinusitis which read to spine and into brain, being treated intermittently with antibiotics over the past year. EXAM: CT VENOGRAM HEAD TECHNIQUE: Contiguous axial images were obtained from the base of the skull through the vertex without intravenous contrast. Subsequently, CT venography of  the head was performed. Coronal and sagittal reconstructions were submitted for evaluation. Additionally, axial, coronal and sagittal thick MIP reconstructions were also submitted. CONTRAST:  42m OMNIPAQUE IOHEXOL 350 MG/ML SOLN COMPARISON:  Brain MRI 11/27/2019. FINDINGS: NON-CONTRAST CT HEAD FINDINGS: Brain: Stable, mild generalized parenchymal atrophy. There is no acute intracranial hemorrhage. No demarcated cortical infarct is identified. No extra-axial fluid collection. No evidence of intracranial mass. No midline shift. Vascular: No hyperdense vessel is demonstrated on pre-contrast imaging. Skull: No calvarial fracture or suspicious calvarial lesion. Sinuses/Orbits: Visualized orbits show no acute finding. Redemonstrated postsurgical appearance of the paranasal sinuses. There is extensive partial opacification of the left frontal sinus with a large air-fluid level. Moderate mucosal thickening within the left ethmoidectomy cavity. Moderate mucosal thickening versus surgical flap within the dependent aspect of the midline sphenoidectomy cavity. Moderate mucosal thickening and frothy secretions within both maxillary sinuses. Other: Small right mastoid effusion. CT VENOGRAM FINDINGS: The superior sagittal sinus, internal cerebral veins, vein of Galen, straight sinus, transverse sinuses, sigmoid sinuses and visualized jugular veins are patent. There is no appreciable intracranial venous thrombosis. IMPRESSION: CT head: 1. No evidence of acute intracranial abnormality. 2. Stable, mild generalized parenchymal atrophy. 3. Redemonstrated postsurgical changes of the paranasal sinuses with evidence of sinusitis as follows. Extensive partial opacification of the left frontal sinus with a large air-fluid level. Moderate mucosal thickening and frothy secretions within both maxillary sinuses. Moderate mucosal thickening versus surgical flap within the dependent aspect of the sphenoidectomy cavity. Moderate mucosal  thickening within the left ethmoidectomy cavity. 4. Small right mastoid effusion. CT venography: No evidence of intracranial venous thrombosis. Electronically Signed   By: KKellie SimmeringDO   On: 11/29/2019 13:44   DG FLUORO GUIDE LUMBAR PUNCTURE  Result Date: 11/28/2019 CLINICAL DATA:  Severe headache.  Negative spinal MRI. EXAM: DIAGNOSTIC LUMBAR PUNCTURE UNDER FLUOROSCOPIC GUIDANCE FLUOROSCOPY TIME:  Radiation Exposure Index (as provided by the fluoroscopic device): 27.9 mGy If the device does not provide the exposure index: Fluoroscopy Time (in minutes and seconds):  1 minutes 18 seconds Number of Acquired Images:  1 PROCEDURE: Informed consent was obtained from the patient prior to the procedure, including potential complications of headache, allergy, and pain. With the patient prone, the lower back was prepped with Betadine. 1% Lidocaine was used for local anesthesia. Lumbar puncture was performed at the L5-S1 level using a 20 gauge needle with return of clear CSF with an opening pressure of 23 cm water. Nine ml of CSF were obtained for laboratory studies. The patient tolerated the procedure well  and there were no apparent complications. IMPRESSION: Successful lumbar puncture. Electronically Signed   By: Suzy Bouchard M.D.   On: 11/28/2019 17:17        Scheduled Meds: . atorvastatin  80 mg Oral Daily  . bisacodyl  10 mg Rectal Daily  . fluticasone  1 spray Each Nare BID  . fluticasone furoate-vilanterol  1 puff Inhalation Daily   And  . umeclidinium bromide  1 puff Inhalation Daily  . gabapentin  1,200 mg Oral BID  . insulin aspart  0-15 Units Subcutaneous TID WC  . insulin aspart  0-5 Units Subcutaneous QHS  . magnesium citrate  1 Bottle Oral Once  . montelukast  10 mg Oral Daily  . nortriptyline  25 mg Oral QHS  . pantoprazole (PROTONIX) IV  40 mg Intravenous Q12H  . polyethylene glycol  17 g Oral BID  . potassium chloride  40 mEq Oral Q4H  . senna  1 tablet Oral BID  . sodium  chloride flush  3 mL Intravenous Q12H   Continuous Infusions: . sodium chloride    . ampicillin-sulbactam (UNASYN) IV 3 g (11/30/19 1002)  . methocarbamol (ROBAXIN) IV Stopped (11/27/19 2325)  . ondansetron (ZOFRAN) IV    .  sodium bicarbonate  infusion 1000 mL 125 mL/hr at 11/29/19 1325     LOS: 2 days    Time spent: 35 minutes    Irine Seal, MD [facility]   To contact the attending provider between 7A-7P or the covering provider during after hours 7P-7A, please log into the web site [email or web address] and access using universal [place] password for that web site. If you do not have the password, please call the hospital operator.  11/30/2019, 11:31 AM

## 2019-12-01 DIAGNOSIS — R509 Fever, unspecified: Secondary | ICD-10-CM | POA: Diagnosis not present

## 2019-12-01 DIAGNOSIS — M545 Low back pain: Secondary | ICD-10-CM | POA: Diagnosis not present

## 2019-12-01 DIAGNOSIS — R112 Nausea with vomiting, unspecified: Secondary | ICD-10-CM | POA: Diagnosis not present

## 2019-12-01 DIAGNOSIS — E119 Type 2 diabetes mellitus without complications: Secondary | ICD-10-CM | POA: Diagnosis not present

## 2019-12-01 LAB — CBC
HCT: 34.1 % — ABNORMAL LOW (ref 39.0–52.0)
Hemoglobin: 11.2 g/dL — ABNORMAL LOW (ref 13.0–17.0)
MCH: 29.4 pg (ref 26.0–34.0)
MCHC: 32.8 g/dL (ref 30.0–36.0)
MCV: 89.5 fL (ref 80.0–100.0)
Platelets: 336 10*3/uL (ref 150–400)
RBC: 3.81 MIL/uL — ABNORMAL LOW (ref 4.22–5.81)
RDW: 13.2 % (ref 11.5–15.5)
WBC: 5 10*3/uL (ref 4.0–10.5)
nRBC: 0 % (ref 0.0–0.2)

## 2019-12-01 LAB — GLUCOSE, CAPILLARY
Glucose-Capillary: 113 mg/dL — ABNORMAL HIGH (ref 70–99)
Glucose-Capillary: 102 mg/dL — ABNORMAL HIGH (ref 70–99)
Glucose-Capillary: 182 mg/dL — ABNORMAL HIGH (ref 70–99)
Glucose-Capillary: 243 mg/dL — ABNORMAL HIGH (ref 70–99)

## 2019-12-01 LAB — BASIC METABOLIC PANEL
Anion gap: 12 (ref 5–15)
BUN: 13 mg/dL (ref 8–23)
CO2: 28 mmol/L (ref 22–32)
Calcium: 8.3 mg/dL — ABNORMAL LOW (ref 8.9–10.3)
Chloride: 99 mmol/L (ref 98–111)
Creatinine, Ser: 0.81 mg/dL (ref 0.61–1.24)
GFR calc Af Amer: >60 mL/min (ref >60)
GFR calc non Af Amer: >60 mL/min (ref >60)
Glucose, Bld: 133 mg/dL — ABNORMAL HIGH (ref 70–99)
Potassium: 4 mmol/L (ref 3.5–5.1)
Sodium: 139 mmol/L (ref 135–145)

## 2019-12-01 LAB — CYTOLOGY - NON PAP

## 2019-12-01 MED ORDER — IRBESARTAN 150 MG PO TABS
150.0000 mg | ORAL_TABLET | Freq: Every day | ORAL | Status: DC
  Administered 2019-12-02: 150 mg via ORAL
  Filled 2019-12-01 (×2): qty 1

## 2019-12-01 MED ORDER — SALINE SPRAY 0.65 % NA SOLN
2.0000 | Freq: Two times a day (BID) | NASAL | Status: DC
  Administered 2019-12-01: 2 via NASAL
  Filled 2019-12-01: qty 44

## 2019-12-01 NOTE — TOC Transition Note (Signed)
Transition of Care [facility]) - CM/SW Discharge Note   Patient Details  Name: Bryan Lopez MRN: [id number] Date of Birth: 09/05/1960  [facility] CM/SW Contact:  Lanier Clam, RN Phone Number: 12/01/2019, 12:19 PM   Clinical Narrative: Patient states he has an appt for a pcp set up. otpt PT neuro rehab referral placed per CM(prior note) No further CM needs.      Final next level of care: Home/Self Care Barriers to Discharge: No Barriers Identified   Patient Goals and CMS Choice Patient states their goals for this hospitalization and ongoing recovery are:: to go home      Discharge Placement                       Discharge Plan and Services   Discharge Planning Services: CM Consult            DME Arranged: N/A DME Agency: NA       HH Arranged: NA HH Agency: NA        Social Determinants of Health (SDOH) Interventions     Readmission Risk Interventions No flowsheet data found.

## 2019-12-01 NOTE — Progress Notes (Signed)
PROGRESS NOTE    Bryan Lopez  BRA:[id number] DOB: [birth year] DOA: 11/26/2019 PCP: Patient, [name]    Chief Complaint  Patient presents with   Back Pain    Brief Narrative:  Bryan Lopez is a 59 y.o. male with a past medical history of diabetes mellitus type 2 on insulin, history of sinusitis, history of asthma, who apparently recently moved here from [place].  He has been lifting a lot of heavy objects over the past couple of months and has noticed some back pain.  Yesterday he lifted some heavy boxes and experienced severe pain in his back.  Transiently he felt some numbness in both his fingers.  Currently he is experiencing severe back pain in his left lower back radiating down to his left leg.  Denies any red flag symptoms such as bowel or bladder incontinence.  He mentions that he is also experienced some chills at home and felt feverish although he did not check his temperature.  Denies any weakness per se in his legs.  Denies any shortness of breath cough.  He is unvaccinated for Covid.  Denies any dysuria.  No diarrhea.  He did travel to [place] last week.  Currently the pain in the back is about 8 out of 10 in intensity.  He is also nauseated.  In the emergency department patient underwent plain films of his back which did not show any acute findings.  Due to fever, elevated CRP, MRI has been ordered and is still pending   Assessment & Plan:   Active Problems:   History of frequent upper respiratory infection   Other allergic rhinitis   SIRS (systemic inflammatory response syndrome) (HCC)   Acute back pain   Fever   DM (diabetes mellitus), type 2 (HCC)   Emesis   Headache   Acute recurrent sinusitis  1 acute lower back pain/headache/nausea and vomiting Patient noted to have nausea and vomiting over the past 2-3 days early on in the hospitalization.  Nausea and vomiting improved.  Headache improving.  Headache likely secondary to acute sinusitis.  Lower back pain slowly  improving. 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  Fever curve trended down.  Leukocytosis has improved.  Blood cultures pending with no growth to date.  Patient on was on Augmentin and has been changed to IV Unasyn.  Abdominal films consistent with moderate stool burden.  Chest x-ray no acute infiltrate.  Patient underwent LP on 11/28/2019 which was negative for meningitis.  CT head venogram done with no evidence of intracranial venous thrombosis.  CT head venogram with acute sinusitis.  CT head negative on admission.  MRI with no acute abnormalities aside from acute sinusitis noted.  Continue IV Unasyn.  IV Compazine, IV Toradol as needed headache and back pain, scheduled ibuprofen.  See problem #7.  Will need outpatient follow-up with ENT, Dr. Wilburn Cornelia 1 week post discharge.  Outpatient follow-up with PCP with referral likely to orthopedics for his ongoing back pain.  Supportive care.  2.  Fever/SIRS Likely secondary to acute sinusitis.  Patient on presentation met criteria for systemic inflammatory response with tachycardia and fever.  Fever curve trending down.  Urinalysis unremarkable.  Chest x-ray unremarkable.  COVID-19 PCR test was negative.  MRI of the L-spine, C-spine, T-spine, brain with no significant abnormalities except for sinusitis.  Patient with complaints of headache, nausea vomiting  and ongoing lower back pain.  See problem #1.  Patient underwent LP 11/28/2019 with no organisms seen.  Negative for meningitis.  CT head venogram with no source of fever noted on the aside from sinusitis.  Patient was on Augmentin which has been discontinued and patient currently on IV Unasyn.  Continue IV Unasyn through today and transition to Augmentin tomorrow.  Continue supportive care.   3.  Metabolic acidosis Questionable etiology.  Likely secondary to ongoing nausea vomiting low  back pain.  Patient has been pancultured.  Patient with a history of diabetes however blood glucose levels within normal limits.  Repeat UA nitrite negative, leukocytes negative, 0-5 WBC.  Acidosis resolved with bicarb drip.  Bicarb drip has been discontinued.  Follow.   4.  Diabetes mellitus type 2 Patient noted to be on [place], [place], [place].  Hemoglobin A1c of 7.9.  Patient with a blood glucose level of 113 this morning.  Continue to hold oral hypoglycemic agents.  Sliding scale insulin.  5.  Hyponatremia Likely secondary to hypovolemic hyponatremia patient stated had nausea and vomiting.  Urine osmolality 671.  Urine sodium 114.  TSH at 1.109.  Hyponatremia improved with hydration.  Saline lock IV fluids.  Follow.    6.  Coronary artery disease Patient mentioned to admitting physician that he had a balloon angioplasty done several years ago.  No heart issues since then.  Patient not on antiplatelet therapy.  Patient not on a beta-blocker.  Patient noted to be on an ARB which is currently on hold due to low blood pressure.  Blood pressure improved.  Resume ARB.  Follow.  7.  Acute maxillary sinusitis Noted on MRI of the brain.  Patient noted to have some facial pain/congestion.  Patient started on Augmentin however patient with ongoing nausea and emesis and as such Augmentin discontinued and patient on IV Unasyn.  CT venogram head redemonstrated postsurgical changes of the paranasal sinuses with evidence of sinusitis, extensive partial opacification of the left frontal sinus with large air-fluid level, moderate mucosal thickening and frothy secretions within both maxillary sinuses, moderate mucosal thickening versus surgical flap within the dependent aspect of the sphenoidectomy cavity.  Moderate mucosal thickening within the left ethmoidectomy cavity.  Small right mastoid effusion.  Patient with some improvement with nausea and emesis.  Continue IV Unasyn and could transition to Augmentin  tomorrow and treat for 10 more days.  Will place on saline nasal spray, Flonase.  Patient with diabetes and as such we will hold off on steroids.  Will need follow-up with ENT, Dr. Wilburn Cornelia in 1 week.  8.  Constipation Abdominal films with moderate stool burden.  Patient noted not to have had any bowel movement for the past 4 to 5 days prior to admission.  Patient placed on enema however refused.  Patient given some magnesium citrate overnight with bowel movement.  Patient with bowel movements and improvement of constipation.  Continue daily Dulcolax suppository, Mucinex twice daily, Senokot-S twice daily.  Will likely need bowel regimen on discharge.  9.  Hypokalemia Likely secondary to GI losses from nausea and emesis.  Repleted.  Potassium at 4.0.  Follow.     DVT prophylaxis: SCDs Code Status: Full Family Communication: Updated patient.  No family at bedside. Disposition:   Status is: Inpatient    Dispo: The patient is from: Home              Anticipated d/c is to: Home  Anticipated d/c date is:  12/02/2019.              Patient with improvement with headache, nausea and emesis.  Having bowel movement.  On IV Unasyn.  Currently not medically stable for discharge.       Consultants:   IR for LP.  Curb sided ENT, Dr. Wilburn Cornelia 12/01/2019  Procedures:   CT head 10/11/2019  CT C-spine 11/26/2019  Chest x-ray 11/28/2019 pending,  Abdominal films 11/28/2019 pending  Chest x-ray 11/26/2019  Plain films of the L-spine and T-spine 11/26/2019  MRI brain MRI C-spine 11/27/2019  MRI L/MRI T-spine 11/27/2018  LP 11/28/2019  CT head venogram 11/29/2019  Antimicrobials:   Amoxicillin 11/27/2019>>>> 11/28/2019  IV Unasyn 11/28/2019   Subjective: Patient in bed.  Denies any further nausea or emesis.  Stated had multiple bowel movements yesterday.  Some improvement with headache.  Some improvement with low back pain.  Feeling much better than he did over the past 1 to 2 days.    Objective: Vitals:   11/30/19 1355 11/30/19 2055 12/01/19 0613 12/01/19 0839  BP: 124/73 134/75 140/85   Pulse: 100 97 98   Resp:  17 17   Temp: 98.4 F (36.9 C) 98.6 F (37 C) (!) 97 F (36.1 C)   TempSrc: Oral Oral Axillary   SpO2: 92% 92% 94% 96%  Weight:      Height:        Intake/Output Summary (Last 24 hours) at 12/01/2019 1257 Last data filed at 11/30/2019 1922 Gross per 24 hour  Intake --  Output 400 ml  Net -400 ml   Filed Weights   11/27/19 0346  Weight: 86.2 kg    Examination:  General exam: NAD Respiratory system: Some coarse breath sounds otherwise clear.  No wheezing.  No crackles.  Normal respiratory effort.  Speaking in full sentences.  Cardiovascular system: RRR no murmurs rubs or gallops.  No JVD.  No lower extremity edema.  Gastrointestinal system: Abdomen is nontender, nondistended, soft, positive bowel sounds.  No rebound.  No guarding.  Central nervous system: Alert and oriented. No focal neurological deficits. Extremities: Symmetric 5 x 5 power. Skin: No rashes, lesions or ulcers Psychiatry: Judgement and insight appear normal. Mood & affect appropriate.     Data Reviewed: I have personally reviewed following labs and imaging studies  CBC: Recent Labs  Lab 11/26/19 2359 11/28/19 0538 11/29/19 0600 11/30/19 0453 12/01/19 0517  WBC 10.8* 10.9* 9.4 6.0 5.0  NEUTROABS 8.7* 8.9* 7.4 4.2  --   HGB 13.1 13.4 12.4* 11.7* 11.2*  HCT 40.2 41.7 37.6* 34.8* 34.1*  MCV 90.1 91.6 88.7 86.8 89.5  PLT 289 328 314 321 481    Basic Metabolic Panel: Recent Labs  Lab 11/28/19 0538 11/28/19 0905 11/29/19 0600 11/30/19 0453 12/01/19 0517  NA 130* 131* 131* 136 139  K 4.6 4.8 4.0 2.9* 4.0  CL 100 101 99 94* 99  CO2 13* 11* 19* 30 28  GLUCOSE 91 107* 151* 137* 133*  BUN 17 18 12 13 13   CREATININE 0.94 1.05 0.92 0.74 0.81  CALCIUM 9.1 8.8* 8.6* 8.3* 8.3*  MG  --   --   --  2.6*  --     GFR: Estimated Creatinine Clearance: 102.3 mL/min (by  C-G formula based on SCr of 0.81 mg/dL).  Liver Function Tests: Recent Labs  Lab 11/26/19 2359 11/28/19 0538  AST 18 17  ALT 19 18  ALKPHOS 124 109  BILITOT 2.1* 1.8*  PROT 8.5* 7.6  ALBUMIN 4.4 3.8    CBG: Recent Labs  Lab 11/30/19 1202 11/30/19 1638 11/30/19 2053 12/01/19 0829 12/01/19 1148  GLUCAP 110* 227* 155* 113* 102*     Recent Results (from the past 240 hour(s))  SARS Coronavirus 2 by RT PCR (hospital order, performed in [facility] hospital lab) Nasopharyngeal Nasopharyngeal Swab     Status: None   Collection Time: 11/26/19 11:59 PM   Specimen: Nasopharyngeal Swab  Result Value Ref Range Status   SARS Coronavirus 2 NEGATIVE NEGATIVE Final    Comment: (NOTE) SARS-CoV-2 target nucleic acids are NOT DETECTED.  The SARS-CoV-2 RNA is generally detectable in upper and lower respiratory specimens during the acute phase of infection. The lowest concentration of SARS-CoV-2 viral copies this assay can detect is 250 copies / mL. A negative result does not preclude SARS-CoV-2 infection and should not be used as the sole basis for treatment or other patient management decisions.  A negative result may occur with improper specimen collection / handling, submission of specimen other than nasopharyngeal swab, presence of viral mutation(s) within the areas targeted by this assay, and inadequate number of viral copies (<250 copies / mL). A negative result must be combined with clinical observations, patient history, and epidemiological information.  Fact Sheet for Patients:   StrictlyIdeas.no  Fact Sheet for Healthcare Providers: BankingDealers.co.za  This test is not yet approved or  cleared by the [place] FDA and has been authorized for detection and/or diagnosis of SARS-CoV-2 by FDA under an Emergency Use Authorization (EUA).  This EUA will remain in effect (meaning this test can be used) for the duration of  the COVID-19 declaration under Section 564(b)(1) of the Act, 21 U.S.C. section 360bbb-3(b)(1), unless the authorization is terminated or revoked sooner.  Performed at [facility], [place] [address], [place], [place] [address]   Urine culture     Status: Abnormal   Collection Time: 11/27/19  2:00 AM   Specimen: Urine, Random  Result Value Ref Range Status   Specimen Description   Final    URINE, RANDOM Performed at [facility] [address] [place], [place] [address]    Special Requests   Final    NONE Performed at [facility], [place] [address] [place], [place] [address]    Culture (A)  Final    <10,000 COLONIES/mL INSIGNIFICANT GROWTH Performed at [facility] [address] [place], [place] [address]    Report Status 11/28/2019 FINAL  Final  Blood culture (routine x 2)     Status: None (Preliminary result)   Collection Time: 11/27/19  2:00 AM   Specimen: BLOOD  Result Value Ref Range Status   Specimen Description   Final    BLOOD LEFT ANTECUBITAL Performed at [facility] [address] [place], [place] [address]    Special Requests   Final    BOTTLES DRAWN AEROBIC AND ANAEROBIC Blood Culture results may not be optimal due to an excessive volume of blood received in culture bottles Performed at [facility] [address], [place], [place] [address]    Culture   Final    NO GROWTH 4 DAYS Performed at [facility], [place] [address], [place], [place] [address]    Report Status PENDING  Incomplete  Blood culture (routine x 2)     Status: None (Preliminary result)   Collection Time: 11/27/19  2:00 AM   Specimen: BLOOD  Result Value Ref Range Status   Specimen Description   Final    BLOOD BLOOD RIGHT HAND Performed at [facility] [address] [place], [place] [address]    Special Requests   Final    BOTTLES DRAWN AEROBIC AND ANAEROBIC  Blood Culture adequate volume Performed at [facility] [address], [place], [place] [address]    Culture   Final    NO GROWTH 4 DAYS Performed at [facility], [place] [address], [place], [place] [address]    Report Status PENDING  Incomplete  Culture, Urine     Status: None   Collection Time: 11/28/19 12:22 PM   Specimen: Urine, Random  Result Value Ref Range Status   Specimen Description   Final    URINE, RANDOM Performed at [facility] [address], [place], [place] [address]    Special Requests   Final    NONE Performed at [facility], [place] [address], [place], [place] [address]    Culture   Final    NO GROWTH Performed at [facility], [place] [address] [place], [place] [address]    Report Status 11/29/2019 FINAL  Final  Anaerobic culture     Status: None (Preliminary result)   Collection Time: 11/28/19  4:30 PM   Specimen: PATH Cytology CSF; Cerebrospinal Fluid  Result Value Ref Range Status   Specimen Description   Final    CSF Performed at [facility] [address], [place], [place] [address]    Special Requests   Final    NONE Performed at [facility], [place] [address] [place], [place] [address]    Culture   Final    NO ANAEROBES ISOLATED; CULTURE IN PROGRESS FOR 5 DAYS   Report Status PENDING  Incomplete  CSF culture     Status: None (Preliminary result)   Collection Time: 11/28/19  4:30 PM   Specimen: PATH Cytology CSF; Cerebrospinal Fluid  Result Value Ref Range Status   Specimen Description   Final    CSF Performed at [facility], [place] [address] [place], [place], [place] [address]    Special Requests   Final    NONE Performed at [facility], [place] [address] [place], [place] [address]    Gram Stain   Final    NO ORGANISMS SEEN WBC PRESENT, PREDOMINANTLY MONONUCLEAR CYTOSPIN SMEAR NOTIFIED GORRELL,S RN  @1836  ON 11/28/19 JACKSON,K    Culture   Final    NO GROWTH 3 DAYS Performed at [facility], [place] [address] [place], [place] [address]    Report Status PENDING  Incomplete  Culture, fungus without smear     Status: None (Preliminary result)   Collection Time: 11/28/19  4:30 PM   Specimen: PATH Cytology CSF; Cerebrospinal Fluid  Result Value Ref Range Status   Specimen Description   Final    CSF Performed at [facility] [address], [place], [place] [address]    Special Requests   Final    NONE Performed at [facility], [place] [address] [place], [place] [address]    Culture   Final    NO FUNGUS ISOLATED AFTER 3 DAYS Performed at [facility], [place] [address] [place],  [address]    Report Status PENDING  Incomplete         Radiology Studies: No results found.  Scheduled Meds:  atorvastatin  80 mg Oral Daily   bisacodyl  10 mg Rectal Daily   fluticasone  1 spray Each Nare BID   fluticasone furoate-vilanterol  1 puff Inhalation Daily   And   umeclidinium bromide  1 puff Inhalation Daily   gabapentin  1,200 mg Oral BID   guaiFENesin  1,200 mg Oral BID   ibuprofen  600 mg Oral TID   insulin aspart  0-15 Units Subcutaneous TID WC   insulin aspart  0-5 Units Subcutaneous QHS   magnesium citrate  1 Bottle Oral Once   montelukast  10 mg Oral Daily   nortriptyline  25 mg Oral QHS   pantoprazole  40 mg Oral BID   polyethylene glycol  17 g Oral BID   senna  1 tablet Oral BID   sodium chloride flush  3 mL Intravenous Q12H   Continuous Infusions:  sodium chloride     sodium chloride 100 mL/hr at 12/01/19 0942   ampicillin-sulbactam (UNASYN) IV 3 g (12/01/19 0945)   methocarbamol (ROBAXIN) IV Stopped (11/27/19 2325)   ondansetron (ZOFRAN) IV       LOS: 3 days    Time spent: 35 minutes    Irine Seal, MD [facility]   To contact the attending provider between  7A-7P or the covering provider during after hours 7P-7A, please log into the web site [email or web address] and access using universal [place] password for that web site. If you do not have the password, please call the hospital operator.  12/01/2019, 12:57 PM

## 2019-12-01 NOTE — Progress Notes (Signed)
Physical Therapy Treatment Patient Details Name: Bryan Lopez MRN: [id number] DOB: Nov 14, 1960 Today's Date: 12/01/2019    History of Present Illness 59 y.o. male with a past medical history of diabetes mellitus type 2 on insulin, history of sinusitis, history of asthma, who apparently recently moved here from [place]  He has been lifting a lot of heavy objects over the past couple of months and has noticed some back pain. admitted iwth nausea, vomiting, URI/sinusitis    PT Comments    Assisted OOB to amb a functional distance in hallway with "improved" back pain.  Assisted to sink to static stand and brush teeth.  Positioned to comfort in recliner.  Pt is motivated to D/C to home with his brother.    Follow Up Recommendations  Outpatient PT     Equipment Recommendations  None recommended by PT    Recommendations for Other Services       Precautions / Restrictions Restrictions Weight Bearing Restrictions: No    Mobility  Bed Mobility Overal bed mobility: Needs Assistance Bed Mobility: Supine to Sit     Supine to sit: Supervision;HOB elevated     General bed mobility comments: increased time and use of rail  Transfers Overall transfer level: Needs assistance Equipment used: None Transfers: Sit to/from Stand;Stand Pivot Transfers Sit to Stand: Supervision Stand pivot transfers: Supervision;Min guard       General transfer comment: for safety and lines.  Ambulation/Gait Ambulation/Gait assistance: Supervision;Min guard Gait Distance (Feet): 285 Feet Assistive device: IV Pole Gait Pattern/deviations: Step-through pattern Gait velocity: WNL   General Gait Details: tolerated a functional distance with light hold to IV pole "feels good to walk"   Stairs             Wheelchair Mobility    Modified Rankin (Stroke Patients Only)       Balance                                            Cognition   Behavior During Therapy: WFL for  tasks assessed/performed Overall Cognitive Status: Within Functional Limits for tasks assessed                                 General Comments: A&Ox4 motivated to get back home with his brother and their 2 pugs      Exercises      General Comments        Pertinent Vitals/Pain Pain Assessment: Faces Faces Pain Scale: Hurts little more Pain Location: back "but it's getting better" Pain Descriptors / Indicators: Throbbing Pain Intervention(s): Monitored during session    Home Living                      Prior Function            PT Goals (current goals can now be found in the care plan section) Progress towards PT goals: Progressing toward goals    Frequency    Min 2X/week      PT Plan Current plan remains appropriate    Co-evaluation              AM-PAC PT "6 Clicks" Mobility   Outcome Measure  Help needed turning from your back to your side while in a flat bed without using bedrails?: None Help  needed moving from lying on your back to sitting on the side of a flat bed without using bedrails?: None Help needed moving to and from a bed to a chair (including a wheelchair)?: None Help needed standing up from a chair using your arms (e.g., wheelchair or bedside chair)?: None Help needed to walk in hospital room?: A Little Help needed climbing 3-5 steps with a railing? : A Little 6 Click Score: 22    End of Session Equipment Utilized During Treatment: Gait belt Activity Tolerance: Patient limited by fatigue;Patient limited by pain Patient left: in chair;with call bell/phone within reach Nurse Communication: Mobility status PT Visit Diagnosis: Difficulty in walking, not elsewhere classified (R26.2);Other abnormalities of gait and mobility (R26.89);Pain     Time: 1600-1620 PT Time Calculation (min) (ACUTE ONLY): 20 min  Charges:  $Gait Training: 8-22 mins                     Felecia Shelling  PTA Acute  Rehabilitation  Services Pager      [phone] Office      [phone]

## 2019-12-01 NOTE — Evaluation (Signed)
Occupational Therapy Evaluation Patient Details Name: Bryan Lopez MRN: [id number] DOB: October 12, 1960 Today's Date: 12/01/2019    History of Present Illness 59 y.o. male with a past medical history of diabetes mellitus type 2 on insulin, history of sinusitis, history of asthma, who apparently recently moved here from [place]  He has been lifting a lot of heavy objects over the past couple of months and has noticed some back pain. admitted iwth nausea, vomiting, URI/sinusitis   Clinical Impression   An occupational therapy evaluation was completed on this 59 year old, right handed,  male with pertinent past medical history of asthma and DM II as well as PMH in chart . Patient is currently requiring minimal to contact guard assist with ADLs including LB dressing and bathing, toileting and all IADLs, which is below patient's typical baseline of being Independent. ?During this evaluation, patient was limited by pain, which has the potential to impact patient's safety and independence during functional mobility, as well as performance for ADLs. ?[employer] AM-PAC "6-clicks" Daily Activity Inpatient Short Form score of ?20/24 indicates a mild ADL impairment this session. Patient lives with his brother, who is disabled but able to provide 24/7 supervision and light assistance. ?Patient demonstrates good rehab potential, and should benefit from continued skilled occupational therapy services while in acute care to maximize safety, independence and quality of life at home.  No post-acute OT needs anticipated. ?     Follow Up Recommendations   (Agree with outpatient PT)    Equipment Recommendations       Recommendations for Other Services       Precautions / Restrictions Precautions Precautions: Fall Restrictions Weight Bearing Restrictions: No      Mobility Bed Mobility Overal bed mobility: Needs Assistance Bed Mobility: Sidelying to Sit;Sit to Sidelying   Sidelying to sit: Min guard  (Required CGA/tactile cuing to perform log roll correctly with avoidance of twisting.)     Sit to sidelying: Supervision General bed mobility comments: Pt educated on log roll techniuqe for in and out of bed and able to demo back with CGA for initial roll, then with supervision and verbal cues only.  Dependent on bed rail which pt does not have at home.  Transfers Overall transfer level: Needs assistance   Transfers: Sit to/from Stand;Stand Pivot Transfers Sit to Stand: Supervision Stand pivot transfers: Supervision       General transfer comment: for safety and lines.    Balance Overall balance assessment: Mild deficits observed, not formally tested                                         ADL either performed or assessed with clinical judgement   ADL Overall ADL's : Needs assistance/impaired Eating/Feeding: Independent   Grooming: Wash/dry face;Standing;Supervision/safety   Upper Body Bathing: Set up;Sitting Upper Body Bathing Details (indicate cue type and reason): Based on general assessment Lower Body Bathing: Minimal assistance;Set up Lower Body Bathing Details (indicate cue type and reason): Based on general assessment Upper Body Dressing : Supervision/safety;Set up;Sitting Upper Body Dressing Details (indicate cue type and reason): Based on general assessment, avoiding overhead reaching to protect back Lower Body Dressing: Minimal assistance;Set up;Cueing for back precautions Lower Body Dressing Details (indicate cue type and reason): Sitting EOB pt unable to complete figure 4 position. Pt educated on use of adaptive equipment for ease. Pt donned underwear over feet with Minimal assist  to avoid forward flexion of spine. Cued to dress weakner LE first. Toilet Transfer: Regular Toilet;Supervision/safety   Toileting- Clothing Manipulation and Hygiene: Min guard;Sitting/lateral lean;Sit to/from stand;Cueing for back precautions;Cueing for compensatory  techniques     Tub/Shower Transfer Details (indicate cue type and reason): Not yet tested Functional mobility during ADLs: Supervision/safety General ADL Comments: Pt ambulated in room without AD with supervision.     Vision   Vision Assessment?: No apparent visual deficits     Perception     Praxis      Pertinent Vitals/Pain Pain Assessment: 0-10 Pain Score: 6  Pain Location: back Pain Descriptors / Indicators: Stabbing Pain Intervention(s): Limited activity within patient's tolerance;Repositioned;Relaxation;Premedicated before session;Monitored during session  Vitals:   12/01/19 0613 12/01/19 0839  BP: 140/85   Pulse: 98   Resp: 17   Temp: (!) 97 F (36.1 C)   SpO2: 94% 96%        Hand Dominance Right   Extremity/Trunk Assessment Upper Extremity Assessment Upper Extremity Assessment: Overall WFL for tasks assessed   Lower Extremity Assessment Lower Extremity Assessment: Overall WFL for tasks assessed   Cervical / Trunk Assessment Cervical / Trunk Assessment: Normal   Communication Communication Communication: No difficulties   Cognition Arousal/Alertness: Awake/alert Behavior During Therapy: WFL for tasks assessed/performed Overall Cognitive Status: Within Functional Limits for tasks assessed                                 General Comments: A&Ox4   General Comments       Exercises     Shoulder Instructions      Home Living Family/patient expects to be discharged to:: Private residence Living Arrangements: Other relatives (Brother who is disabled but requires no physical assistance and is able to assist with IADLs as needed) Available Help at Discharge: Family;Available 24 hours/day Type of Home: Mobile home ("Single Wide") Home Access: Stairs to enter Entrance Stairs-Number of Steps: 6 Entrance Stairs-Rails: Left Home Layout: One level     Bathroom Shower/Tub: Chief Strategy Officer: Handicapped height     Home  Equipment: None   Additional Comments: Pt reports a "sturdy" towel bar to pull up from near the commode as well as a vanity to push up from.      Prior Functioning/Environment Level of Independence: Independent                 OT Problem List: Decreased activity tolerance;Impaired balance (sitting and/or standing);Decreased knowledge of precautions;Pain      OT Treatment/Interventions:      OT Goals(Current goals can be found in the care plan section) Acute Rehab OT Goals Patient Stated Goal: Get ready to get back to work when ready.  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 OT Goal Formulation: With patient Time For Goal Achievement: 12/15/19 Potential to Achieve Goals: Good ADL Goals Pt Will Perform Lower Body Dressing: with adaptive equipment;with modified independence (Avoiding back flexion or twisting) Pt Will Perform Toileting - Clothing Manipulation and hygiene: with modified independence;with adaptive equipment (Educated on AE options) Pt Will Perform Tub/Shower Transfer: with modified independence;Tub transfer;ambulating Additional ADL Goal #1: Pt will demonstrate all functional mobility including bed mobility and transfers while following back precautions to avoid exacerbation of pain or injury.  OT Frequency: Min 2X/week (Anticipate 1-2 more visits needed only to meet OT goals)   Barriers to D/C:    None known  Co-evaluation              AM-PAC OT "6 Clicks" Daily Activity     Outcome Measure Help from another person eating meals?: None Help from another person taking care of personal grooming?: None Help from another person toileting, which includes using toliet, bedpan, or urinal?: A Little Help from another person bathing (including washing, rinsing, drying)?: A Little Help from another person to put on and taking off regular upper body clothing?: A Little Help from another person to put on and taking off regular lower body  clothing?: A Little 6 Click Score: 20   End of Session Nurse Communication: Mobility status  Activity Tolerance: Patient tolerated treatment well;No increased pain Patient left: in bed;with call bell/phone within reach;with bed alarm set  OT Visit Diagnosis: Pain Pain - part of body:  (back)                Time: 3295-1884 OT Time Calculation (min): 34 min Charges:  OT General Charges $OT Visit: 1 Visit OT Evaluation $OT Eval Low Complexity: 1 Low OT Treatments $Self Care/Home Management : 8-22 mins  Victorino Dike, OT Acute Rehab Services Office: [phone] 12/01/2019   Theodoro Clock 12/01/2019, 11:22 AM

## 2019-12-02 ENCOUNTER — Encounter: Payer: Self-pay | Admitting: Family Medicine

## 2019-12-02 DIAGNOSIS — M5442 Lumbago with sciatica, left side: Secondary | ICD-10-CM | POA: Diagnosis not present

## 2019-12-02 LAB — BASIC METABOLIC PANEL
Anion gap: 10 (ref 5–15)
BUN: 12 mg/dL (ref 8–23)
CO2: 28 mmol/L (ref 22–32)
Calcium: 8.3 mg/dL — ABNORMAL LOW (ref 8.9–10.3)
Chloride: 101 mmol/L (ref 98–111)
Creatinine, Ser: 0.71 mg/dL (ref 0.61–1.24)
GFR calc Af Amer: >60 mL/min (ref >60)
GFR calc non Af Amer: >60 mL/min (ref >60)
Glucose, Bld: 145 mg/dL — ABNORMAL HIGH (ref 70–99)
Potassium: 3.7 mmol/L (ref 3.5–5.1)
Sodium: 139 mmol/L (ref 135–145)

## 2019-12-02 LAB — CSF CULTURE W GRAM STAIN
Culture: NO GROWTH
Gram Stain: NONE SEEN

## 2019-12-02 LAB — GLUCOSE, CAPILLARY
Glucose-Capillary: 128 mg/dL — ABNORMAL HIGH (ref 70–99)
Glucose-Capillary: 158 mg/dL — ABNORMAL HIGH (ref 70–99)

## 2019-12-02 LAB — CBC WITH DIFFERENTIAL/PLATELET
Abs Immature Granulocytes: 0.13 10*3/uL — ABNORMAL HIGH (ref 0.00–0.07)
Basophils Absolute: 0.1 10*3/uL (ref 0.0–0.1)
Basophils Relative: 1 %
Eosinophils Absolute: 0.4 10*3/uL (ref 0.0–0.5)
Eosinophils Relative: 8 %
HCT: 32.7 % — ABNORMAL LOW (ref 39.0–52.0)
Hemoglobin: 10.7 g/dL — ABNORMAL LOW (ref 13.0–17.0)
Immature Granulocytes: 3 %
Lymphocytes Relative: 28 %
Lymphs Abs: 1.4 10*3/uL (ref 0.7–4.0)
MCH: 29.2 pg (ref 26.0–34.0)
MCHC: 32.7 g/dL (ref 30.0–36.0)
MCV: 89.3 fL (ref 80.0–100.0)
Monocytes Absolute: 0.4 10*3/uL (ref 0.1–1.0)
Monocytes Relative: 7 %
Neutro Abs: 2.7 10*3/uL (ref 1.7–7.7)
Neutrophils Relative %: 53 %
Platelets: 366 10*3/uL (ref 150–400)
RBC: 3.66 MIL/uL — ABNORMAL LOW (ref 4.22–5.81)
RDW: 13.1 % (ref 11.5–15.5)
WBC: 5.1 10*3/uL (ref 4.0–10.5)
nRBC: 0 % (ref 0.0–0.2)

## 2019-12-02 LAB — CULTURE, BLOOD (ROUTINE X 2)
Culture: NO GROWTH
Culture: NO GROWTH
Special Requests: ADEQUATE

## 2019-12-02 MED ORDER — SENNA 8.6 MG PO TABS: 1.0000 | ORAL_TABLET | Freq: Two times a day (BID) | ORAL | 0 refills | Status: DC

## 2019-12-02 MED ORDER — AMOXICILLIN-POT CLAVULANATE 875-125 MG PO TABS
1.0000 | ORAL_TABLET | Freq: Two times a day (BID) | ORAL | Status: DC
  Administered 2019-12-02: 1 via ORAL
  Filled 2019-12-02: qty 1

## 2019-12-02 MED ORDER — IBUPROFEN 600 MG PO TABS: 600.0000 mg | ORAL_TABLET | Freq: Three times a day (TID) | ORAL | 0 refills | Status: DC

## 2019-12-02 MED ORDER — POLYETHYLENE GLYCOL 3350 17 G PO PACK: 17.0000 g | PACK | Freq: Two times a day (BID) | ORAL | 0 refills | Status: DC

## 2019-12-02 MED ORDER — OXYCODONE HCL 10 MG PO TABS: 10.0000 mg | ORAL_TABLET | Freq: Every day | ORAL | 0 refills | Status: AC | PRN

## 2019-12-02 MED ORDER — SALINE SPRAY 0.65 % NA SOLN: 2.0000 | Freq: Two times a day (BID) | NASAL | 0 refills | Status: DC

## 2019-12-02 MED ORDER — AMOXICILLIN-POT CLAVULANATE 875-125 MG PO TABS: 1.0000 | ORAL_TABLET | Freq: Two times a day (BID) | ORAL | 0 refills | Status: DC

## 2019-12-02 MED ORDER — GUAIFENESIN ER 600 MG PO TB12: 1200.0000 mg | ORAL_TABLET | Freq: Two times a day (BID) | ORAL | 0 refills | Status: DC

## 2019-12-02 NOTE — Discharge Summary (Signed)
Physician Discharge Summary  Bryan Lopez:[id number] DOB: 1961/04/13 DOA: 11/26/2019  PCP: Patient, [name]  Admit date: 11/26/2019 Discharge date: 12/02/2019  Time spent: 45 minutes  Recommendations for Outpatient Follow-up:  1. Recommend outpatient pain management given he has had chronic low back pain for long time-he was given a very limited prescription of oxycodone on discharge for 2 days only 2. 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 Should follow-up with allergist  Discharge Diagnoses:  Active Problems:   History of frequent upper respiratory infection   Other allergic rhinitis   SIRS (systemic inflammatory response syndrome) (HCC)   Acute back pain   Fever   DM (diabetes mellitus), type 2 (HCC)   Emesis   Headache   Acute recurrent sinusitis   Discharge Condition: Improved  Diet recommendation: Heart healthy diabetic  Filed Weights   11/27/19 0346  Weight: 86.2 kg    History of present illness:  59 year old male DM TY 2 on insulin sinusitis asthma chronic allergies moved here several months ago to work here from [place] Also apparently has chronic low back pain with neuropathy Admitted 8/6 with severe lower back pain-no red flag symptoms-found to have however a fever elevated CRP MRI was performed LP was performed  Hospital Course:  Acute lower back pain with nausea vomiting It was felt initially that this could have been meningitis LP was done 8/7 which was negative-CT venogram negative for venous thrombosis CT had negative MRI brain showed acute sinusitis-patient was on IV Unasyn and other IV pain meds like Toradol-he will need follow-up with Dr. Annalee Genta 1 week post discharge and outpatient pain management or orthopedic follow-up He should be set up with the PCP on discharge in addition He has already been set up for outpatient therapy for his back and I have given him a note excusing him from work until 8/16  Fever  secondary secondary to acute sinusitis Was on Unasyn which was transitioned to Augmentin and he should complete the course as dictated  Metabolic acidosis  he had been on a bicarb drip earlier in the admission-this resolved since that time is stable  DM TY 2 Resume Jardiance Actos A1c was 7.9  Acute maxillary sinusitis He was treated for this during hospital stay and given scripts to complete empiric therapy  Constipation Likely secondary to oral opiates-placed on medications for the same  Hypokalemia-resolved  Procedures:  Multiple  Consultations:  ENT telephone consulted-Dr. Annalee Genta  Discharge Exam: Vitals:   12/02/19 0628 12/02/19 0825  BP: (!) 141/89   Pulse: 87   Resp: 16   Temp: 97.7 F (36.5 C)   SpO2: 91% 94%    General: Awake coherent and some moderate painful distress in his back Cardiovascular: S1-S2 no murmur rub or gallop Respiratory: Coarse breath sounds no rales no rhonchi Abdomen soft nontender no rebound no guarding No lower extremity edema  Discharge Instructions   Discharge Instructions    Ambulatory referral to Physical Therapy   Complete by: As directed    Diet - low sodium heart healthy   Complete by: As directed    Discharge instructions   Complete by: As directed    Please ensure that you get follow-up in the outpatient setting for your allergy issues You will need to complete the Augmentin going forward and will need follow-up with your pain physician given his chronic pains that he has had for a while A letter has been given to  excuse you from work-please present this to work and consider a different line of work if it is causing you pain   Increase activity slowly   Complete by: As directed      Allergies as of 12/02/2019   No Known Allergies     Medication List    STOP taking these medications   fentaNYL 50 MCG/HR Commonly known as: DURAGESIC   OneTouch Ultra test strip Generic drug: glucose blood     TAKE these  medications   albuterol 108 (90 Base) MCG/ACT inhaler Commonly known as: VENTOLIN HFA Inhale 1-2 puffs into the lungs every 4 (four) hours as needed for wheezing.   amoxicillin-clavulanate 875-125 MG tablet Commonly known as: AUGMENTIN Take 1 tablet by mouth 2 (two) times daily.   atorvastatin 80 MG tablet Commonly known as: LIPITOR Take 80 mg by mouth daily.   Dupixent 300 MG/2ML prefilled syringe Generic drug: dupilumab Inject 300 mg into the skin every 14 (fourteen) days.   EPINEPHrine 0.3 mg/0.3 mL Soaj injection Commonly known as: EPI-PEN Inject 0.3 mg into the muscle as needed for anaphylaxis.   fluticasone 50 MCG/ACT nasal spray Commonly known as: FLONASE Place 1 spray into both nostrils 2 (two) times daily.   gabapentin 600 MG tablet Commonly known as: NEURONTIN Take 1,200 mg by mouth 2 (two) times daily.   guaiFENesin 600 MG 12 hr tablet Commonly known as: MUCINEX Take 2 tablets (1,200 mg total) by mouth 2 (two) times daily.   ibuprofen 600 MG tablet Commonly known as: ADVIL Take 1 tablet (600 mg total) by mouth 3 (three) times daily.   Jardiance 25 MG Tabs tablet Generic drug: empagliflozin Take 25 mg by mouth daily.   methocarbamol 500 MG tablet Commonly known as: ROBAXIN Take 1 tablet (500 mg total) by mouth 2 (two) times daily.   montelukast 10 MG tablet Commonly known as: SINGULAIR Take 10 mg by mouth daily.   nortriptyline 25 MG capsule Commonly known as: PAMELOR Take 25 mg by mouth at bedtime.   Oxycodone HCl 10 MG Tabs Take 1 tablet (10 mg total) by mouth 5 (five) times daily as needed for up to 2 days (pain).   pantoprazole 40 MG tablet Commonly known as: PROTONIX Take 40 mg by mouth daily.   polyethylene glycol 17 g packet Commonly known as: MIRALAX / GLYCOLAX Take 17 g by mouth 2 (two) times daily.   senna 8.6 MG Tabs tablet Commonly known as: SENOKOT Take 1 tablet (8.6 mg total) by mouth 2 (two) times daily.   sodium chloride  0.65 % Soln nasal spray Commonly known as: OCEAN Place 2 sprays into both nostrils 2 (two) times daily.   SOLIQUA Lambert Inject 40-60 Units into the skin daily. Depending on CBG's   Trelegy Ellipta 200-62.5-25 MCG/INH Aepb Generic drug: Fluticasone-Umeclidin-Vilant Inhale 1 puff into the lungs daily.   valsartan 160 MG tablet Commonly known as: DIOVAN Take 160 mg by mouth daily.      No Known Allergies  Follow-up Information    [facility]. Call in 1 day.   Why: To schedule an appointment Contact information: [address] [place] [address] [phone]       [facility] Follow up.   Specialty: Rehabilitation Why: They will call you to set up appt. Contact information: [address] [id number] mc [place] [address] [phone]       Osborn Coho, MD. Schedule an appointment as soon  as possible for a visit in 1 week(s).   Specialty: Otolaryngology Contact information: [address] [place] [address] [phone]                The results of significant diagnostics from this hospitalization (including imaging, microbiology, ancillary and laboratory) are listed below for reference.    Significant Diagnostic Studies: DG Chest 2 View  Result Date: 11/28/2019 CLINICAL DATA:  Patient came in 2 days ago for back pain. Now with nausea, vomiting and fever. EXAM: CHEST - 2 VIEW COMPARISON:  11/26/2019 FINDINGS: The heart size and mediastinal contours are within normal limits. Both lungs are clear. The visualized skeletal structures are unremarkable. IMPRESSION: No active cardiopulmonary disease. Electronically Signed   By: Norva Pavlov M.D.   On: 11/28/2019 14:12   DG Chest 2 View  Result Date: 11/26/2019 CLINICAL DATA:  Cough.  Back pain after lifting heavy object today. EXAM: CHEST - 2 VIEW COMPARISON:  None. FINDINGS:  Heart size and pulmonary vascularity are normal. Lungs are clear. No pleural effusions. No pneumothorax. Mediastinal contours appear intact. Degenerative changes in the spine. IMPRESSION: No active cardiopulmonary disease. Electronically Signed   By: Burman Nieves M.D.   On: 11/26/2019 23:46   DG Thoracic Spine 2 View  Result Date: 11/26/2019 CLINICAL DATA:  Back pain after lifting heavy object today. EXAM: THORACIC SPINE 2 VIEWS COMPARISON:  None. FINDINGS: Normal alignment of the thoracic spine. No vertebral compression deformities. Degenerative changes with disc space narrowing and endplate hypertrophic changes mostly in the midthoracic region. Bone cortex appears intact. No paraspinal soft tissue swelling. IMPRESSION: Degenerative changes in the thoracic spine. No acute displaced fractures identified. Electronically Signed   By: Burman Nieves M.D.   On: 11/26/2019 23:43   DG Lumbar Spine Complete  Result Date: 11/26/2019 CLINICAL DATA:  Back pain after lifting a heavy object today. EXAM: LUMBAR SPINE - COMPLETE 4+ VIEW COMPARISON:  None. FINDINGS: Five lumbar type vertebral bodies. Normal alignment. No vertebral compression deformities. Mild degenerative changes with slight disc space narrowing and endplate osteophyte formation. Sclerosis and prominent degenerative changes in the posterior elements at L5-S1 on the left. This may indicate spondylolysis. No spondylolisthesis. Visualized sacrum appears intact. IMPRESSION: 1. No acute displaced fractures identified. 2. Possible spondylolysis at L5-S1 on the left with asymmetric prominence of degenerative changes in the posterior elements. Electronically Signed   By: Burman Nieves M.D.   On: 11/26/2019 23:45   CT Head Wo Contrast  Result Date: 11/27/2019 CLINICAL DATA:  Worsening headache, immunodeficiency, fever, previous sinus and nasal surgery for MRSA infection. EXAM: CT HEAD WITHOUT CONTRAST TECHNIQUE: Contiguous axial images were obtained from  the base of the skull through the vertex without intravenous contrast. COMPARISON:  None. FINDINGS: Brain: No evidence of acute infarction, hemorrhage, hydrocephalus, extra-axial collection or mass lesion/mass effect. Vascular: No hyperdense vessel or unexpected calcification. Skull: Normal. Negative for fracture or focal lesion. Sinuses/Orbits: Air-fluid level in the sphenoid sinus likely sinusitis. Mucosal thickening in the paranasal sinuses otherwise. Postoperative changes with partial left turbinate tectum ease and resection of left ethmoid septations. Mastoid air cells are clear. Other: None. IMPRESSION: 1. No acute intracranial abnormalities. 2. Air-fluid level in the sphenoid sinus likely sinusitis. Electronically Signed   By: Burman Nieves M.D.   On: 11/27/2019 03:06   CT Cervical Spine Wo Contrast  Result Date: 11/26/2019 CLINICAL DATA:  Status post trauma. EXAM: CT CERVICAL SPINE WITHOUT CONTRAST TECHNIQUE: Multidetector CT imaging of the cervical spine  was performed without intravenous contrast. Multiplanar CT image reconstructions were also generated. COMPARISON:  None. FINDINGS: Alignment: Normal. Skull base and vertebrae: No acute fracture. No primary bone lesion or focal pathologic process. Soft tissues and spinal canal: No prevertebral fluid or swelling. No visible canal hematoma. Disc levels: Mild to moderate severity endplate sclerosis is seen at the levels of C5-C6 and C6-C7. Mild intervertebral disc space narrowing is also seen at these levels. Mild to moderate severity bilateral multilevel facet joint hypertrophy is noted. Upper chest: A 6 mm bone island is seen within the paraspinal region of the third left rib. Other: There is moderate severity sphenoid sinus mucosal thickening. IMPRESSION: 1. No acute fracture or subluxation of the cervical spine. 2. Mild to moderate severity degenerative changes at the levels of C5-C6 and C6-C7. 3. Moderate severity sphenoid sinus mucosal thickening.  Electronically Signed   By: Aram Candela M.D.   On: 11/26/2019 21:14   MR Brain W and Wo Contrast  Result Date: 11/27/2019 CLINICAL DATA:  Sinusitis, headaches. EXAM: MRI HEAD WITHOUT AND WITH CONTRAST TECHNIQUE: Multiplanar, multiecho pulse sequences of the brain and surrounding structures were obtained without and with intravenous contrast. CONTRAST:  9mL GADAVIST GADOBUTROL 1 MMOL/ML IV SOLN COMPARISON:  11/27/2019 head CT. FINDINGS: Brain: Sequela of tiny remote anterior left centrum semiovale insult. No intracranial hemorrhage. No diffusion-weighted signal abnormality. No midline shift, ventriculomegaly or extra-axial fluid collection. No mass lesion. No abnormal enhancement. Vascular: Normal flow voids. Skull and upper cervical spine: Normal marrow signal. No abnormal enhancement. Sinuses/Orbits: Normal orbits. Postsurgical appearance of the paranasal sinuses. There is moderate mucosal thickening involving the left frontal, ethmoid and sphenoid sinuses. Minimal to mild mucosal thickening involving the right frontal and bilateral maxillary sinuses. Layering left frontal and sphenoid sinus secretions likely reflect active inflammation. Trace right mastoid effusion. Other: None. IMPRESSION: 1. No acute intracranial abnormality. 2. Sequela of tiny remote anterior left centrum semiovale insult. 3. Postsurgical appearance of the paranasal sinuses. Pansinus disease with layering left frontal and sphenoid sinus secretions reflecting active inflammation. No evidence of orbital or intracranial involvement. 4. Trace right mastoid effusion. Electronically Signed   By: Stana Bunting M.D.   On: 11/27/2019 12:42   MR Cervical Spine W or Wo Contrast  Result Date: 11/27/2019 CLINICAL DATA:  Severe headaches and total spine pain, history of sinusitis with MRSA infections EXAM: MRI CERVICAL, THORACIC AND LUMBAR SPINE WITHOUT AND WITH CONTRAST TECHNIQUE: Multiplanar and multiecho pulse sequences of the cervical  spine, to include the craniocervical junction and cervicothoracic junction, and thoracic and lumbar spine, were obtained without and with intravenous contrast. COMPARISON:  None. FINDINGS: MRI CERVICAL SPINE Alignment: Anteroposterior alignment is maintained. Vertebrae: Vertebral body heights are preserved. Cord: No abnormal signal.  No abnormal intrathecal enhancement. Posterior Fossa, vertebral arteries, paraspinal tissues: Unremarkable. Disc levels: C2-C3:  Mild facet hypertrophy.  No canal or foraminal stenosis. C3-C4: Mild uncovertebral hypertrophy. Moderate left facet hypertrophy. No canal or right foraminal stenosis. Mild left foraminal stenosis. C4-C5: Disc bulge with endplate osteophytes slightly eccentric to the left. No canal or right foraminal stenosis. Mild to moderate left foraminal stenosis. C5-C6: Disc bulge with endplate osteophytes. Uncovertebral hypertrophy. Mild canal stenosis. Mild foraminal stenosis. C6-C7: Disc bulge with endplate osteophytes. No canal or right foraminal stenosis. Minor left foraminal stenosis. C7-T1:  No canal or foraminal stenosis. MRI THORACIC SPINE Alignment:  Anteroposterior alignment is maintained. Vertebrae: There is no marrow edema.  No suspicious osseous lesion. Cord: No abnormal cord signal. No abnormal intrathecal  enhancement. Paraspinal and other soft tissues: Left basilar atelectasis/consolidation. Disc levels: Minor degenerative disc disease is present. For example, there is a small right paracentral disc protrusion at T2-T3. There is facet hypertrophy, greatest at T3-T4 and T4-T5 on the right. MRI LUMBAR SPINE Segmentation:  Standard. Alignment:  Anteroposterior alignment is maintained. Vertebrae: Vertebral body heights are preserved. There is no marrow edema. There is no suspicious osseous lesion. Conus medullaris and cauda equina: Conus extends to the L1 level. Conus and cauda equina appear normal. No abnormal intrathecal enhancement. Paraspinal and other soft  tissues: Unremarkable. Disc levels: There is no significant canal stenosis at any level. Marked left facet arthropathy with hypertrophic changes present at L5-S1. Results in mild narrowing of the left L5-S1 foramen. IMPRESSION: No significant abnormality identified. Mild degenerative changes are present. Electronically Signed   By: Guadlupe Spanish M.D.   On: 11/27/2019 13:03   MR THORACIC SPINE W WO CONTRAST  Result Date: 11/27/2019 CLINICAL DATA:  Severe headaches and total spine pain, history of sinusitis with MRSA infections EXAM: MRI CERVICAL, THORACIC AND LUMBAR SPINE WITHOUT AND WITH CONTRAST TECHNIQUE: Multiplanar and multiecho pulse sequences of the cervical spine, to include the craniocervical junction and cervicothoracic junction, and thoracic and lumbar spine, were obtained without and with intravenous contrast. COMPARISON:  None. FINDINGS: MRI CERVICAL SPINE Alignment: Anteroposterior alignment is maintained. Vertebrae: Vertebral body heights are preserved. Cord: No abnormal signal.  No abnormal intrathecal enhancement. Posterior Fossa, vertebral arteries, paraspinal tissues: Unremarkable. Disc levels: C2-C3:  Mild facet hypertrophy.  No canal or foraminal stenosis. C3-C4: Mild uncovertebral hypertrophy. Moderate left facet hypertrophy. No canal or right foraminal stenosis. Mild left foraminal stenosis. C4-C5: Disc bulge with endplate osteophytes slightly eccentric to the left. No canal or right foraminal stenosis. Mild to moderate left foraminal stenosis. C5-C6: Disc bulge with endplate osteophytes. Uncovertebral hypertrophy. Mild canal stenosis. Mild foraminal stenosis. C6-C7: Disc bulge with endplate osteophytes. No canal or right foraminal stenosis. Minor left foraminal stenosis. C7-T1:  No canal or foraminal stenosis. MRI THORACIC SPINE Alignment:  Anteroposterior alignment is maintained. Vertebrae: There is no marrow edema.  No suspicious osseous lesion. Cord: No abnormal cord signal. No abnormal  intrathecal enhancement. Paraspinal and other soft tissues: Left basilar atelectasis/consolidation. Disc levels: Minor degenerative disc disease is present. For example, there is a small right paracentral disc protrusion at T2-T3. There is facet hypertrophy, greatest at T3-T4 and T4-T5 on the right. MRI LUMBAR SPINE Segmentation:  Standard. Alignment:  Anteroposterior alignment is maintained. Vertebrae: Vertebral body heights are preserved. There is no marrow edema. There is no suspicious osseous lesion. Conus medullaris and cauda equina: Conus extends to the L1 level. Conus and cauda equina appear normal. No abnormal intrathecal enhancement. Paraspinal and other soft tissues: Unremarkable. Disc levels: There is no significant canal stenosis at any level. Marked left facet arthropathy with hypertrophic changes present at L5-S1. Results in mild narrowing of the left L5-S1 foramen. IMPRESSION: No significant abnormality identified. Mild degenerative changes are present. Electronically Signed   By: Guadlupe Spanish M.D.   On: 11/27/2019 13:03   MR Lumbar Spine W Wo Contrast  Result Date: 11/27/2019 CLINICAL DATA:  Severe headaches and total spine pain, history of sinusitis with MRSA infections EXAM: MRI CERVICAL, THORACIC AND LUMBAR SPINE WITHOUT AND WITH CONTRAST TECHNIQUE: Multiplanar and multiecho pulse sequences of the cervical spine, to include the craniocervical junction and cervicothoracic junction, and thoracic and lumbar spine, were obtained without and with intravenous contrast. COMPARISON:  None. FINDINGS: MRI CERVICAL SPINE  Alignment: Anteroposterior alignment is maintained. Vertebrae: Vertebral body heights are preserved. Cord: No abnormal signal.  No abnormal intrathecal enhancement. Posterior Fossa, vertebral arteries, paraspinal tissues: Unremarkable. Disc levels: C2-C3:  Mild facet hypertrophy.  No canal or foraminal stenosis. C3-C4: Mild uncovertebral hypertrophy. Moderate left facet hypertrophy. No  canal or right foraminal stenosis. Mild left foraminal stenosis. C4-C5: Disc bulge with endplate osteophytes slightly eccentric to the left. No canal or right foraminal stenosis. Mild to moderate left foraminal stenosis. C5-C6: Disc bulge with endplate osteophytes. Uncovertebral hypertrophy. Mild canal stenosis. Mild foraminal stenosis. C6-C7: Disc bulge with endplate osteophytes. No canal or right foraminal stenosis. Minor left foraminal stenosis. C7-T1:  No canal or foraminal stenosis. MRI THORACIC SPINE Alignment:  Anteroposterior alignment is maintained. Vertebrae: There is no marrow edema.  No suspicious osseous lesion. Cord: No abnormal cord signal. No abnormal intrathecal enhancement. Paraspinal and other soft tissues: Left basilar atelectasis/consolidation. Disc levels: Minor degenerative disc disease is present. For example, there is a small right paracentral disc protrusion at T2-T3. There is facet hypertrophy, greatest at T3-T4 and T4-T5 on the right. MRI LUMBAR SPINE Segmentation:  Standard. Alignment:  Anteroposterior alignment is maintained. Vertebrae: Vertebral body heights are preserved. There is no marrow edema. There is no suspicious osseous lesion. Conus medullaris and cauda equina: Conus extends to the L1 level. Conus and cauda equina appear normal. No abnormal intrathecal enhancement. Paraspinal and other soft tissues: Unremarkable. Disc levels: There is no significant canal stenosis at any level. Marked left facet arthropathy with hypertrophic changes present at L5-S1. Results in mild narrowing of the left L5-S1 foramen. IMPRESSION: No significant abnormality identified. Mild degenerative changes are present. Electronically Signed   By: Guadlupe Spanish M.D.   On: 11/27/2019 13:03   DG Abd 2 Views  Result Date: 11/28/2019 CLINICAL DATA:  Nausea, vomiting, fever. EXAM: ABDOMEN - 2 VIEW COMPARISON:  None. FINDINGS: Bowel gas pattern is nonobstructed. Moderate stool burden. No evidence for  organomegaly. No abnormal calcifications. Degenerative changes are seen in the lumbosacral spine. IMPRESSION: Moderate stool burden. Electronically Signed   By: Norva Pavlov M.D.   On: 11/28/2019 14:12   CT VENOGRAM HEAD  Result Date: 11/29/2019 CLINICAL DATA:  Headache, chronic, new features or increased frequency. Additional provided: Patient reports severe headache, nausea and vomiting, posterior neck pain. Prior history of MRSA sinusitis which read to spine and into brain, being treated intermittently with antibiotics over the past year. EXAM: CT VENOGRAM HEAD TECHNIQUE: Contiguous axial images were obtained from the base of the skull through the vertex without intravenous contrast. Subsequently, CT venography of the head was performed. Coronal and sagittal reconstructions were submitted for evaluation. Additionally, axial, coronal and sagittal thick MIP reconstructions were also submitted. CONTRAST:  75mL OMNIPAQUE IOHEXOL 350 MG/ML SOLN COMPARISON:  Brain MRI 11/27/2019. FINDINGS: NON-CONTRAST CT HEAD FINDINGS: Brain: Stable, mild generalized parenchymal atrophy. There is no acute intracranial hemorrhage. No demarcated cortical infarct is identified. No extra-axial fluid collection. No evidence of intracranial mass. No midline shift. Vascular: No hyperdense vessel is demonstrated on pre-contrast imaging. Skull: No calvarial fracture or suspicious calvarial lesion. Sinuses/Orbits: Visualized orbits show no acute finding. Redemonstrated postsurgical appearance of the paranasal sinuses. There is extensive partial opacification of the left frontal sinus with a large air-fluid level. Moderate mucosal thickening within the left ethmoidectomy cavity. Moderate mucosal thickening versus surgical flap within the dependent aspect of the midline sphenoidectomy cavity. Moderate mucosal thickening and frothy secretions within both maxillary sinuses. Other: Small right mastoid effusion. CT VENOGRAM  FINDINGS: The  superior sagittal sinus, internal cerebral veins, vein of Galen, straight sinus, transverse sinuses, sigmoid sinuses and visualized jugular veins are patent. There is no appreciable intracranial venous thrombosis. IMPRESSION: CT head: 1. No evidence of acute intracranial abnormality. 2. Stable, mild generalized parenchymal atrophy. 3. Redemonstrated postsurgical changes of the paranasal sinuses with evidence of sinusitis as follows. Extensive partial opacification of the left frontal sinus with a large air-fluid level. Moderate mucosal thickening and frothy secretions within both maxillary sinuses. Moderate mucosal thickening versus surgical flap within the dependent aspect of the sphenoidectomy cavity. Moderate mucosal thickening within the left ethmoidectomy cavity. 4. Small right mastoid effusion. CT venography: No evidence of intracranial venous thrombosis. Electronically Signed   By: Jackey Loge DO   On: 11/29/2019 13:44   DG FLUORO GUIDE LUMBAR PUNCTURE  Result Date: 11/28/2019 CLINICAL DATA:  Severe headache.  Negative spinal MRI. EXAM: DIAGNOSTIC LUMBAR PUNCTURE UNDER FLUOROSCOPIC GUIDANCE FLUOROSCOPY TIME:  Radiation Exposure Index (as provided by the fluoroscopic device): 27.9 mGy If the device does not provide the exposure index: Fluoroscopy Time (in minutes and seconds):  1 minutes 18 seconds Number of Acquired Images:  1 PROCEDURE: Informed consent was obtained from the patient prior to the procedure, including potential complications of headache, allergy, and pain. With the patient prone, the lower back was prepped with Betadine. 1% Lidocaine was used for local anesthesia. Lumbar puncture was performed at the L5-S1 level using a 20 gauge needle with return of clear CSF with an opening pressure of 23 cm water. Nine ml of CSF were obtained for laboratory studies. The patient tolerated the procedure well and there were no apparent complications. IMPRESSION: Successful lumbar puncture. Electronically  Signed   By: Genevive Bi M.D.   On: 11/28/2019 17:17    Microbiology: Recent Results (from the past 240 hour(s))  SARS Coronavirus 2 by RT PCR (hospital order, performed in [facility] hospital lab) Nasopharyngeal Nasopharyngeal Swab     Status: None   Collection Time: 11/26/19 11:59 PM   Specimen: Nasopharyngeal Swab  Result Value Ref Range Status   SARS Coronavirus 2 NEGATIVE NEGATIVE Final    Comment: (NOTE) SARS-CoV-2 target nucleic acids are NOT DETECTED.  The SARS-CoV-2 RNA is generally detectable in upper and lower respiratory specimens during the acute phase of infection. The lowest concentration of SARS-CoV-2 viral copies this assay can detect is 250 copies / mL. A negative result does not preclude SARS-CoV-2 infection and should not be used as the sole basis for treatment or other patient management decisions.  A negative result may occur with improper specimen collection / handling, submission of specimen other than nasopharyngeal swab, presence of viral mutation(s) within the areas targeted by this assay, and inadequate number of viral copies (<250 copies / mL). A negative result must be combined with clinical observations, patient history, and epidemiological information.  Fact Sheet for Patients:   BoilerBrush.com.cy  Fact Sheet for Healthcare Providers: https://pope.com/  This test is not yet approved or  cleared by the [place] FDA and has been authorized for detection and/or diagnosis of SARS-CoV-2 by FDA under an Emergency Use Authorization (EUA).  This EUA will remain in effect (meaning this test can be used) for the duration of the COVID-19 declaration under Section 564(b)(1) of the Act, 21 U.S.C. section 360bbb-3(b)(1), unless the authorization is terminated or revoked sooner.  Performed at [facility], [address] [place], [place] [address]   Urine culture     Status:  Abnormal   Collection Time: 11/27/19  2:00 AM   Specimen: Urine, Random  Result Value Ref Range Status   Specimen Description   Final    URINE, RANDOM Performed at [facility], [address] [place], [place] [address]    Special Requests   Final    NONE Performed at [facility], [address] [place], [place] [address]    Culture (A)  Final    <10,000 COLONIES/mL INSIGNIFICANT GROWTH Performed at [facility], [address] [place], [place] [address]    Report Status 11/28/2019 FINAL  Final  Blood culture (routine x 2)     Status: None (Preliminary result)   Collection Time: 11/27/19  2:00 AM   Specimen: BLOOD  Result Value Ref Range Status   Specimen Description   Final    BLOOD LEFT ANTECUBITAL Performed at [facility], [address] [place], [place] [address]    Special Requests   Final    BOTTLES DRAWN AEROBIC AND ANAEROBIC Blood Culture results may not be optimal due to an excessive volume of blood received in culture bottles Performed at [facility], [address] [place], [place] [address]    Culture   Final    NO GROWTH 4 DAYS Performed at [facility], [address] [place], [place] [address]    Report Status PENDING  Incomplete  Blood culture (routine x 2)     Status: None (Preliminary result)   Collection Time: 11/27/19  2:00 AM   Specimen: BLOOD  Result Value Ref Range Status   Specimen Description   Final    BLOOD BLOOD RIGHT HAND Performed at [facility], [address] [place], [place] [address]    Special Requests   Final    BOTTLES DRAWN AEROBIC AND ANAEROBIC Blood Culture adequate volume Performed at [facility], [address] [place], [place] [address]    Culture   Final    NO GROWTH 4 DAYS Performed at [facility], [address] [place], [place] [address]    Report Status PENDING  Incomplete   Culture, Urine     Status: None   Collection Time: 11/28/19 12:22 PM   Specimen: Urine, Random  Result Value Ref Range Status   Specimen Description   Final    URINE, RANDOM Performed at [facility], [address] [place], [place] [address]    Special Requests   Final    NONE Performed at [facility], [address] [place], [place] [address]    Culture   Final    NO GROWTH Performed at [facility], [address] [place], [place] [address]    Report Status 11/29/2019 FINAL  Final  Anaerobic culture     Status: None (Preliminary result)   Collection Time: 11/28/19  4:30 PM   Specimen: PATH Cytology CSF; Cerebrospinal Fluid  Result Value Ref Range Status   Specimen Description   Final    CSF Performed at [facility], [address] [place], [place] [address]    Special Requests   Final    NONE Performed at [facility], [address] [place], [place] [address]    Culture   Final    NO ANAEROBES ISOLATED; CULTURE IN PROGRESS FOR 5 DAYS   Report Status PENDING  Incomplete  CSF culture  Status: None   Collection Time: 11/28/19  4:30 PM   Specimen: PATH Cytology CSF; Cerebrospinal Fluid  Result Value Ref Range Status   Specimen Description   Final    CSF Performed at [facility], [address], [place], [place] [address]    Special Requests   Final    NONE Performed at [facility], [address], [place], [place] [address]    Gram Stain   Final    NO ORGANISMS SEEN WBC PRESENT, PREDOMINANTLY MONONUCLEAR CYTOSPIN SMEAR NOTIFIED GORRELL,S RN @1836  ON 11/28/19 JACKSON,K    Culture   Final    NO GROWTH 3 DAYS Performed at [facility], [address], [place], [place]    Report Status 12/02/2019 FINAL  Final  Culture, fungus without smear     Status: None (Preliminary result)   Collection Time: 11/28/19  4:30 PM    Specimen: PATH Cytology CSF; Cerebrospinal Fluid  Result Value Ref Range Status   Specimen Description   Final    CSF Performed at [facility], [address], [place], [place]    Special Requests   Final    NONE Performed at [facility], [address] [place], [place]    Culture   Final    NO FUNGUS ISOLATED AFTER 3 DAYS Performed at [facility], [address], [place], [place]    Report Status PENDING  Incomplete     Labs: Basic Metabolic Panel: Recent Labs  Lab 11/28/19 0905 11/29/19 0600 11/30/19 0453 12/01/19 0517 12/02/19 0508  NA 131* 131* 136 139 139  K 4.8 4.0 2.9* 4.0 3.7  CL 101 99 94* 99 101  CO2 11* 19* 30 28 28   GLUCOSE 107* 151* 137* 133* 145*  BUN 18 12 13 13 12   CREATININE 1.05 0.92 0.74 0.81 0.71  CALCIUM 8.8* 8.6* 8.3* 8.3* 8.3*  MG  --   --  2.6*  --   --    Liver Function Tests: Recent Labs  Lab 11/26/19 2359 11/28/19 0538  AST 18 17  ALT 19 18  ALKPHOS 124 109  BILITOT 2.1* 1.8*  PROT 8.5* 7.6  ALBUMIN 4.4 3.8   Recent Labs  Lab 11/26/19 2359  LIPASE 25   No results for input(s): AMMONIA in the last 168 hours. CBC: Recent Labs  Lab 11/26/19 2359 11/26/19 2359 11/28/19 0538 11/29/19 0600 11/30/19 0453 12/01/19 0517 12/02/19 0508  WBC 10.8*   < > 10.9* 9.4 6.0 5.0 5.1  NEUTROABS 8.7*  --  8.9* 7.4 4.2  --  2.7  HGB 13.1   < > 13.4 12.4* 11.7* 11.2* 10.7*  HCT 40.2   < > 41.7 37.6* 34.8* 34.1* 32.7*  MCV 90.1   < > 91.6 88.7 86.8 89.5 89.3  PLT 289   < > 328 314 321 336 366   < > = values in this interval not displayed.   Cardiac Enzymes: No results for input(s): CKTOTAL, CKMB, CKMBINDEX, TROPONINI in the last 168 hours. BNP: BNP (last 3 results) No results for input(s): BNP in the last 8760 hours.  ProBNP (last 3 results) No results for input(s): PROBNP in the last 8760 hours.  CBG: Recent Labs  Lab 12/01/19 0829 12/01/19 1148  12/01/19 1639 12/01/19 2117 12/02/19 0742  GLUCAP 113* 102* 182* 243* 128*       Signed:  01/31/20 MD   [facility]  12/02/2019, 10:07 AM
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
## 2019-12-03 LAB — ANAEROBIC CULTURE

## 2019-12-04 ENCOUNTER — Telehealth: Payer: Self-pay | Admitting: Allergy

## 2019-12-04 NOTE — Telephone Encounter (Signed)
Bryan Lopez they may have been calling to set up shipment???

## 2019-12-04 NOTE — Telephone Encounter (Signed)
An automated call came in from [facility]. It didn't say what they wanted, but asked to have someone call them back at [phone].

## 2019-12-07 ENCOUNTER — Ambulatory Visit: Payer: BC Managed Care – PPO | Attending: Internal Medicine | Admitting: Physical Therapy

## 2019-12-07 DIAGNOSIS — M549 Dorsalgia, unspecified: Secondary | ICD-10-CM | POA: Insufficient documentation

## 2019-12-07 DIAGNOSIS — M545 Low back pain: Secondary | ICD-10-CM | POA: Insufficient documentation

## 2019-12-07 DIAGNOSIS — M5412 Radiculopathy, cervical region: Secondary | ICD-10-CM | POA: Insufficient documentation

## 2019-12-07 DIAGNOSIS — M542 Cervicalgia: Secondary | ICD-10-CM | POA: Insufficient documentation

## 2019-12-07 DIAGNOSIS — R2689 Other abnormalities of gait and mobility: Secondary | ICD-10-CM | POA: Insufficient documentation

## 2019-12-07 DIAGNOSIS — R293 Abnormal posture: Secondary | ICD-10-CM | POA: Insufficient documentation

## 2019-12-07 NOTE — Telephone Encounter (Signed)
It was automated call I will let contact know they called wrong number

## 2019-12-08 ENCOUNTER — Ambulatory Visit: Payer: BC Managed Care – PPO | Admitting: Physical Therapy

## 2019-12-08 ENCOUNTER — Other Ambulatory Visit: Payer: Self-pay

## 2019-12-08 DIAGNOSIS — R293 Abnormal posture: Secondary | ICD-10-CM

## 2019-12-08 DIAGNOSIS — M545 Low back pain, unspecified: Secondary | ICD-10-CM

## 2019-12-08 DIAGNOSIS — R2689 Other abnormalities of gait and mobility: Secondary | ICD-10-CM

## 2019-12-08 DIAGNOSIS — M5412 Radiculopathy, cervical region: Secondary | ICD-10-CM | POA: Diagnosis present

## 2019-12-08 DIAGNOSIS — M549 Dorsalgia, unspecified: Secondary | ICD-10-CM | POA: Diagnosis present

## 2019-12-08 DIAGNOSIS — M542 Cervicalgia: Secondary | ICD-10-CM | POA: Diagnosis present

## 2019-12-08 NOTE — Therapy (Signed)
[facility] [address] [place], [place], [address] Phone: [phone]   Fax:  [phone]  Physical Therapy Evaluation  Patient Details  Name: Bryan Lopez MRN: [id number] Date of Birth: 01/04/1961 Referring Provider (PT): Rodolph Bonghompson, Daniel V, MD   Encounter Date: 12/08/2019   PT End of Session - 12/08/19 1414    Visit Number 1    Number of Visits 13    Date for PT Re-Evaluation 02/06/20    Authorization Type BCBS    PT Start Time 1315    PT Stop Time 1400    PT Time Calculation (min) 45 min    Activity Tolerance Patient tolerated treatment well;Patient limited by pain    Behavior [facility] for tasks assessed/performed           Past Medical History:  Diagnosis Date  . Crohn disease (HCC)   . Frequent sinus infections   . GERD (gastroesophageal reflux disease)   . History of ear infections   . Hx of bronchitis   . Hyperlipidemia   . Hypertension   . Urticaria     Past Surgical History:  Procedure Laterality Date  . Cardiac Stents    . Sinus Surgeries      There were no vitals filed for this visit.    Subjective Assessment - 12/08/19 1318    Subjective Went to the ER 11/26/19 due to extreme low back pain - was moving and lifted a heavy object, was twisting to sit it down. Prior to this episode, also reported L buttock and hip pain that radiated down his LLE (has been doing a lot of driving to and from [place] because he is in the process of moving). Has a history of neck and back pain. In the hospital also had a fever and was admitted with nausea, vomiting, URI/sinusitis. Discharged home from hospital 12/02/19. Has to carry two 20 lbs pugs down 9 steps to let them out to use the bathroom and that has been irritating his back. Under a lot of stress at the moment.    Pertinent History asthma, frequent respiratory infections, HLD, HTN    Limitations Lifting;Standing;Walking;Sitting    How long can you  stand comfortably? approx. 15-20 minutes    How long can you walk comfortably? can walk - it just hurts, can walk smaller household distances    Diagnostic tests MRI cervical, lumbar and thoracic spine 11/27/19: No significant abnormality identified. Mild degenerative changes are present.    Patient Stated Goals wants to be able to bend and move and get back to work    Currently in Pain? Yes    Pain Score 8     Pain Location Back    Pain Orientation Lower   and down posterior buttocks and into upper legs   Pain Descriptors / Indicators Sharp;Stabbing   for the most part it is a dull, throbbing ache but moving a certain way will make it sharp   Pain Type Acute pain   but hx of chronic back pain   Aggravating Factors  twisting, turning, bending    Pain Relieving Factors sitting on a couch or laying down on the bed.              [facility] - 12/08/19 1331      Assessment   Medical Diagnosis low back pain    Referring Provider (PT) Rodolph Bonghompson, Daniel V, MD    Onset Date/Surgical Date 11/26/19    Prior  Therapy had previous PT about 8 years ago after first episode of back pain      Precautions   Precaution Comments was told not to lift anything over 5 lbs, no steps, and was told to purchase a reacher to grab objects vs. 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 currently out of work until  the 31st of august    Leisure likes to read, loves playing with his pugs      Observation/Other Assessments   Observations sitting too long makes B tingling in legs worse      Sensation   Light Touch Impaired Detail   BLE due to neuropathy   Additional Comments reports N/T in B hands and fingers. reports he lost feeling in both of his arms for approx. 20 mins after lifting the box. hx of B carpal tunnel surgery      Posture/Postural Control   Posture/Postural Control Postural limitations    Postural Limitations Rounded Shoulders;Forward head      ROM / Strength   AROM / PROM / Strength Strength;AROM;PROM      AROM   AROM Assessment Site Lumbar;Hip    Lumbar Flexion --   reaching forwards only 19" away from the ground   Lumbar Extension [facility]   feels relief in low back   Lumbar - Right Side Bend 50% limited   starts having tingling in L leg   Lumbar - Left Side Bend 75% limited   incr pain in R side     PROM   Overall PROM Comments guarding with all movement, LLE: incr pain in buttocks and low back at 90 degrees of hip flexion, incr pain approx. 5 degrees of internal rotation, RLE: approx. 100 degrees hip flexion passively before reporting buttock pain, incr pain with 5 degrees of hip internal rotation      Strength   Strength Assessment Site Hip;Knee;Ankle    Right/Left Hip Right;Left    Right Hip Flexion 4/5    Left Hip Flexion 4-/5    Right/Left Knee Right;Left    Right Knee Flexion 4+/5    Right Knee Extension 4+/5    Left Knee Flexion 4/5    Left Knee Extension 4+/5    Right/Left Ankle Right;Left    Right Ankle Dorsiflexion 4+/5    Left Ankle Dorsiflexion 4-/5      Palpation   Palpation comment incr tenderness to palpation lower lumbar paraspinals B      Special Tests    Special Tests Lumbar    Lumbar Tests Straight Leg Raise;Slump Test      Slump test   Findings Negative    Comment feels like a stretch on both sides      Straight Leg Raise   Comment tingling  and incr pain behind R  thigh at approx. 35 degrees, with LLE incr pain at approx. 30-35 degrees      Bed Mobility   Bed Mobility Supine to Sit;Sit to Supine   reports nausea after getting up    Supine to Sit Independent   log roll technique    Sit to Supine Independent   log roll technique      Transfers   Comments sit to stand: needs to use BUE to come to stand from standard arm chair      Ambulation/Gait   Ambulation/Gait Yes    Ambulation/Gait Assistance 7: Independent    Gait Pattern Step-through pattern;Decreased arm swing - right;Decreased arm swing - left;Wide base of support    Ambulation Surface Level;Indoor    Gait Comments guarded with gait due to pain                      Objective measurements completed on examination: See above findings.               PT Education - 12/08/19 1412    Education Details POC, importance of movement - educated on getting up and walking smaller distances around the house every 30 mins-hr as pt reports he is doing a lot of sitting and it is making his back pain worse, also discussed performing his exercises/stretches that he was given from the hospital for his low back to perform more often to encourage movement    Person(s) Educated Patient    Methods Explanation    Comprehension Verbalized understanding            PT Short Term Goals - 12/08/19 1415      PT SHORT TERM GOAL #1   Title Pt will undergo further assessment of ODI with LTG to be written as appropriate. ALL STGS DUE 12/29/19    Time 3    Period Weeks    Status New    Target Date 12/29/19      PT SHORT TERM GOAL #2   Title Pt will report low back pain to a 5/10 or less in order to improve functional mobility.    Baseline 8/10 low back pain    Time 3    Period Weeks    Status New      PT SHORT TERM GOAL #3   Title Pt will demo proper body mechanics to perform a functional squat in order to improve functional mobility and decr low back pain.     Time 3    Period Weeks    Status New             PT Long Term Goals - 12/08/19 1418      PT LONG TERM GOAL #1   Title ODI goal to be written as appropriate. ALL LTGS DUE 01/19/20    Time 6    Period Weeks    Status New    Target Date 01/19/20      PT LONG TERM GOAL #2   Title Pt will report 3/10 or less low back pain in order to improve functional mobility and return to work.    Time 6    Period Weeks    Status New      PT LONG TERM GOAL #3   Title Pt will be independent with initial HEP in order to build upon functional gains made in therapy.    Time 6    Period Weeks    Status New  PT LONG TERM GOAL #4   Title Pt will report being able to perform standing/walking activities for at least 30 minutes in order to improve activity tolerance for work.    Time 6    Period Weeks    Status New                  Plan - 12/08/19 1426    Clinical Impression Statement Patient is a 59 year old male referred to Neuro OPPT for low back pain that flared up a couple weeks ago due to pt lifting heavy objects while moving. Pt with previous history of low back/neck pain. Pt referred to this clinic to be seen due to being unable to be seen for PT at an orthopedic clinc until August 31. Pt currently out of work until August 31.  Pt's PMH is significant for: HLD, HTN, diabetes, hx of sinusitisThe following deficits were present during the exam: low back/buttock pain with intermittent radicular symptoms down pt's posterior B thigh, postural abnormalities, impaired sensation, decr strength, impaired AROM and PROM ,decr activity tolerance, tender to palpation to lower lumbar paraspinals. Pt would benefit from skilled PT to address these impairments and functional limitations to maximize functional mobility independence    Personal Factors and Comorbidities Comorbidity 3+;Time since onset of injury/illness/exacerbation;Past/Current Experience    Comorbidities HLD, HTN, diabetes, hx of  sinusitis    Examination-Activity Limitations Bed Mobility;Bend;Locomotion Level;Caring for Others;Stand;Stairs;Squat;Transfers;Sit    Examination-Participation Restrictions Community Activity   work   Stability/Clinical Decision Making Evolving/Moderate complexity    Clinical Decision Making Moderate    Rehab Potential Good    PT Frequency 2x / week    PT Duration 6 weeks    PT Treatment/Interventions ADLs/Self Care Home Management;Gait training;Stair training;Functional mobility training;Therapeutic activities;Therapeutic exercise;Patient/family education;Manual techniques;Passive range of motion    PT Next Visit Plan perform ODI. palpation of lumbar spine, assess glute strength. initial HEP for gentle movement/stretching. NuStep?    Consulted and Agree with Plan of Care Patient           Patient will benefit from skilled therapeutic intervention in order to improve the following deficits and impairments:  Abnormal gait, Decreased activity tolerance, Decreased range of motion, Decreased mobility, Decreased strength, Difficulty walking, Hypomobility, Impaired flexibility, Impaired sensation, Increased muscle spasms, Postural dysfunction, Improper body mechanics, Pain  Visit Diagnosis: Acute midline low back pain, unspecified whether sciatica present  Other abnormalities of gait and mobility  Abnormal posture     Problem List Patient Active Problem List   Diagnosis Date Noted  . Emesis   . Headache   . Acute recurrent sinusitis   . SIRS (systemic inflammatory response syndrome) (HCC) 11/27/2019  . Acute back pain 11/27/2019  . Fever 11/27/2019  . DM (diabetes mellitus), type 2 (HCC) 11/27/2019  . History of frequent upper respiratory infection 10/07/2019  . Other allergic rhinitis 10/07/2019  . Urticaria 10/07/2019  . Not well controlled severe persistent asthma 10/06/2019    Drake Leach, PT, DPT  12/08/2019, 2:35 PM  [facility] [address] [place], [place], [address] Phone: [phone]   Fax:  [phone]  Name: Bryan Lopez MRN: [id number] Date of Birth: 16-Apr-1961

## 2019-12-09 NOTE — Telephone Encounter (Signed)
Gave [facility] correct phone numbers for patient

## 2019-12-11 ENCOUNTER — Ambulatory Visit: Payer: BC Managed Care – PPO | Admitting: Physical Therapy

## 2019-12-11 ENCOUNTER — Other Ambulatory Visit: Payer: Self-pay

## 2019-12-11 VITALS — BP 94/67 | HR 101

## 2019-12-11 DIAGNOSIS — M545 Low back pain, unspecified: Secondary | ICD-10-CM

## 2019-12-11 DIAGNOSIS — R2689 Other abnormalities of gait and mobility: Secondary | ICD-10-CM | POA: Diagnosis not present

## 2019-12-11 DIAGNOSIS — R293 Abnormal posture: Secondary | ICD-10-CM

## 2019-12-11 NOTE — Patient Instructions (Signed)
Access Code: [email or web address] URL: [email or web address] Date: 12/11/2019 Prepared by: Sherlie Ban  Exercises Seated Hamstring Stretch - 2 x daily - 7 x weekly - 3 sets - 30 hold Shoulder Rolls in Sitting - 1 x daily - 7 x weekly - 2 sets - 10 reps Seated Scapular Retraction - 1-2 x daily - 7 x weekly - 2 sets - 10 reps Lower Trunk Rotations - 2 x daily - 7 x weekly - 2 sets - 10 reps Hooklying Single Knee to Chest Stretch - 2 x daily - 7 x weekly - 3 sets - 30 hold Seated Figure 4 Piriformis Stretch - 1 x daily - 7 x weekly - 3 sets - 20 hold

## 2019-12-11 NOTE — Therapy (Addendum)
[facility] [address] [place], [place], [address] Phone: [phone]   Fax:  [phone]  Physical Therapy Treatment  Patient Details  Name: Bryan Lopez MRN: [id number] Date of Birth: [birth year] Referring Provider (PT): Rodolph Bong, MD   Encounter Date: 12/11/2019   PT End of Session - 12/11/19 1628    Visit Number 2    Number of Visits 13    Date for PT Re-Evaluation 02/06/20    Authorization Type BCBS    PT Start Time 1528    PT Stop Time 1615    PT Time Calculation (min) 47 min    Activity Tolerance Patient limited by pain   limited by dizziness   Behavior During Therapy [facility] for tasks assessed/performed           Past Medical History:  Diagnosis Date  . Crohn disease (HCC)   . Frequent sinus infections   . GERD (gastroesophageal reflux disease)   . History of ear infections   . Hx of bronchitis   . Hyperlipidemia   . Hypertension   . Urticaria     Past Surgical History:  Procedure Laterality Date  . Cardiac Stents    . Sinus Surgeries      Vitals:   12/11/19 1614  BP: 94/67  Pulse: (!) 101     Subjective Assessment - 12/11/19 1530    Subjective Has been working on moving more at home - states that it hasn't really been helping too much. Neck is hurting more too. Pain isn't as bad as from when he was last here.    Pertinent History asthma, frequent respiratory infections, HLD, HTN    Limitations Lifting;Standing;Walking;Sitting    How long can you stand comfortably? approx. 15-20 minutes    How long can you walk comfortably? can walk - it just hurts, can walk smaller household distances    Diagnostic tests MRI cervical, lumbar and thoracic spine 11/27/19: No significant abnormality identified. Mild degenerative changes are present.    Patient Stated Goals wants to be able to bend and move and get back to work    Currently in Pain? Yes    Pain Score 7     Pain Location Back   "from  buttocks all the way up to his neck"   Pain Descriptors / Indicators Dull   "sharp if i move the wrong way"   Pain Type Acute pain    Aggravating Factors  exertion or movement, twisting    Pain Relieving Factors laying in the right position              [facility] PT Assessment - 12/11/19 1546      Observation/Other Assessments   Other Surveys  Oswestry Disability Index    Oswestry Disability Index  55%    Neck Disability Index  --                         [facility] Adult PT Treatment/Exercise - 12/11/19 1546      Ambulation/Gait   Ambulation/Gait Yes    Ambulation/Gait Assistance 7: Independent    Gait Pattern Step-through pattern;Decreased arm swing - right;Decreased arm swing - left;Wide base of support    Ambulation Surface Level;Indoor    Gait Comments incr foot scuffing B when ambulating into and out of clinic       Exercises   Exercises Other Exercises    Other Exercises  NuStep with BLE and BUE  for ROM, activity tolerance, and circulation, for 5 minutes at gear 1, O2 sats after at 97%, HR at 104 bpm              Access Code: [email or web address] URL: [email or web address] Date: 12/11/2019 Prepared by: Sherlie Banhloe Damontay Alred  Initiated HEP - cues for gentle movement for each exercise and not to push into any pain.   Exercises Seated Hamstring Stretch - 2 x daily - 7 x weekly - 3 sets - 30 hold Shoulder Rolls in Sitting - 1 x daily - 7 x weekly - 2 sets - 10 reps Seated Scapular Retraction - 1-2 x daily - 7 x weekly - 2 sets - 10 reps Lower Trunk Rotations - 2 x daily - 7 x weekly - 2 sets - 10 reps Hooklying Single Knee to Chest Stretch - 2 x daily - 7 x weekly - 3 sets - 30 hold Seated Figure 4 Piriformis Stretch - 1 x daily - 7 x weekly - 3 sets - 20 hold    Performed supine exercises with 2 larger pillows under pt to prevent from fully laying flat due to breathing difficulties. After single knee to chest and lower trunk rotations, pt needing to sit up  due to coughing. At end of session pt reporting feeling dizzy and slightly nauseous. Took pt's BP at 94/67 - pt reporting he has not eaten a lot today or drank a lot of water and has not checked his sugars. O2 sats at 100% and HR at 102 bpm. Had pt take a prolonged seated rest break at end of session and provided water with pt reporting feeling better at end of session. Pt to go to his car and check his sugars and eat a protein bar.       PT Short Term Goals - 12/11/19 1641      PT SHORT TERM GOAL #1   Title Pt will undergo further assessment of ODI with LTG to be written as appropriate. ALL STGS DUE 12/29/19    Baseline performed on 12/11/19: 55% = severe disability    Time 3    Period Weeks    Status Achieved    Target Date 12/29/19      PT SHORT TERM GOAL #2   Title Pt will report low back pain to a 5/10 or less in order to improve functional mobility.    Baseline 8/10 low back pain    Time 3    Period Weeks    Status New      PT SHORT TERM GOAL #3   Title Pt will demo proper body mechanics to perform a functional squat in order to improve functional mobility and decr low back pain.    Time 3    Period Weeks    Status New             PT Long Term Goals - 12/11/19 1642      PT LONG TERM GOAL #1   Title Pt will decr ODI score to at least 45% in order to demo decr disability. ALL LTGS DUE 01/19/20    Baseline 55% on 12/11/19    Time 6    Period Weeks    Status New      PT LONG TERM GOAL #2   Title Pt will report 3/10 or less low back pain in order to improve functional mobility and return to work.    Time 6    Period Weeks    Status New  PT LONG TERM GOAL #3   Title Pt will be independent with initial HEP in order to build upon functional gains made in therapy.    Time 6    Period Weeks    Status New      PT LONG TERM GOAL #4   Title Pt will report being able to perform standing/walking activities for at least 30 minutes in order to improve activity tolerance for  work.    Time 6    Period Weeks    Status New                 Plan - 12/11/19 1643    Clinical Impression Statement Had pt fill out the ODI at the beginning of the session with pt scoring a 55% indicating more severe disability. LTG revised as appropriate. Remainder of session focused on gentle activity tolerance on the NuStep and initiating HEP for gentle movement - cues for breathing throughout and to not push into any pain. At end of session pt reporting feeling dizzy (states he has a frequent hx of this), checked pt's BP and pt reporting it is lower than his normal and he has not drank a lot of fluids/ate a lot. Provided pt with water and with prolonged seated rest breaks and pt feeling much better. Overall at end of session pt reporting feeling much looser, but still approx. 7/10 pain.    Personal Factors and Comorbidities Comorbidity 3+;Time since onset of injury/illness/exacerbation;Past/Current Experience    Comorbidities HLD, HTN, diabetes, hx of sinusitis    Examination-Activity Limitations Bed Mobility;Bend;Locomotion Level;Caring for Others;Stand;Stairs;Squat;Transfers;Sit    Examination-Participation Restrictions Community Activity   work   Stability/Clinical Decision Making Evolving/Moderate complexity    Rehab Potential Good    PT Frequency 2x / week    PT Duration 6 weeks    PT Treatment/Interventions ADLs/Self Care Home Management;Gait training;Stair training;Functional mobility training;Therapeutic activities;Therapeutic exercise;Patient/family education;Manual techniques;Passive range of motion    PT Next Visit Plan check BP/watch for any dizziness. how is initial HEP? gentle stretching/weight shifting at counter or try standing lumbar extension over counter, core stabilization, any other gentle movement as pt's whole body is very guarded, NuStep, if supine put pt on wedge or with incr pillows - pt can't tolerate any prone position    Consulted and Agree with Plan of Care  Patient           Patient will benefit from skilled therapeutic intervention in order to improve the following deficits and impairments:  Abnormal gait, Decreased activity tolerance, Decreased range of motion, Decreased mobility, Decreased strength, Difficulty walking, Hypomobility, Impaired flexibility, Impaired sensation, Increased muscle spasms, Postural dysfunction, Improper body mechanics, Pain  Visit Diagnosis: Other abnormalities of gait and mobility  Abnormal posture  Acute midline low back pain, unspecified whether sciatica present     Problem List Patient Active Problem List   Diagnosis Date Noted  . Emesis   . Headache   . Acute recurrent sinusitis   . SIRS (systemic inflammatory response syndrome) (HCC) 11/27/2019  . Acute back pain 11/27/2019  . Fever 11/27/2019  . DM (diabetes mellitus), type 2 (HCC) 11/27/2019  . History of frequent upper respiratory infection 10/07/2019  . Other allergic rhinitis 10/07/2019  . Urticaria 10/07/2019  . Not well controlled severe persistent asthma 10/06/2019    Drake Leach, PT, DPT 12/11/2019, 4:49 PM  [facility] [address] [place], [place], [address] Phone: [phone]   Fax:  [phone]  Name: Bryan Lopez MRN: [id number] Date of Birth: April 23, 1961

## 2019-12-14 NOTE — Progress Notes (Signed)
Follow Up Note  RE: Bryan Lopez MRN: [id number] DOB: [birth year] Date of Office Visit: 12/15/2019  Referring provider: April Manson, NP Primary care provider: April Manson, NP  Chief Complaint: follow up  History of Present Illness: I had the pleasure of seeing Bryan Lopez for a follow up visit at the [facility] on 12/15/2019. He is a 59 y.o. male, who is being followed for asthma, h/o frequent URIs, allergic rhinitis and h/o urticaria. His previous allergy office visit was on 10/06/2019 with Dr. Selena Batten. Today is a regular follow up visit. Up to date with COVID-19 vaccine: no  Patient developed a fever in August and the following day he hurt his back. He went to the ER and was admitted for fever, back pain.   Back is still hurting but the fever resolved. The fever was thought to be triggered by an acute sinus infection for which he was treated with antibiotics.   He is moved fully to  and would still like to receive Dupixent injections at our office as he is already injecting himself with insulin in his abdominal area and feels like there's some scar tissue which makes the Dupixent injections harder to administer. He did try this in the past but had some difficulties with self-administration of Dupixent.   Asthma: Patient has chest congestion especially when laying down. Currently on Trelegy 1 puff daily and not sure if it's helping. Patient had no Dupixent injections for a few months.   Using albuterol due to exertion related shortness of breath.  Did not get flutter valve yet.   Patient used Markus Daft in the hospital and not sure if that worked better than the [employer].   11/28/2019 CXR: IMPRESSION: No active cardiopulmonary disease.  History of frequent upper respiratory infection Patient finished antibiotics for an acute sinus infection.   Other allergic rhinitis Having sinus pains and headaches. Currently using Flonase 1 spray per nostril twice  a day and saline spray.  Takes allegra daily and Singulair daily.    ENT appointment coming up in September.  Urticaria Some itching but no hives since last OV.   Assessment and Plan: Macy is a 59 y.o. male with: Not well controlled severe persistent asthma Past history - Diagnosed with asthma 3-4 years ago. Currently on Trelegy 200 1 puff daily x 1 month, Singulair, albuterol prn. Recently started on Dupixent injections in [place] and due for his injection. 7-8 courses of prednisone per year with minimal benefit. Has reflux and takes Protonix. Not up to date with COVID-19 vaccine. 2021 spirometry showed some restriction. Normal alpha-1 level. Normal CXR. Interim history - patient's last Dupixent was 2 months ago due to working out of state and recent hospitalization for back pain and fever. Not sure if Markus Daft (given in hospital) worked better than Trelegy. . Get flutter valve.  . Continue with Dupixent injections every 2 weeks. o If it's difficult for you to come into our office we can always switch to at home injections.  . Daily controller medication(s): continue with Trelegy 1 puff daily and rinse mouth afterwards. . May use albuterol rescue inhaler 2 puffs every 4 to 6 hours as needed for shortness of breath, chest tightness, coughing, and wheezing. May use albuterol rescue inhaler 2 puffs 5 to 15 minutes prior to strenuous physical activities. Monitor frequency of use.  . Will get spirometry at next visit instead of today due to COVID-19 pandemic and trying to minimize any type of  aerosolizing procedures at this time in the office.   History of frequent upper respiratory infection Past history - Frequent sinusitis, pneumonia, bronchitis and ear infections. 6-8 courses of antibiotics the last 12 months.  Previous allergist notes - Frequent infections needing 10+ courses of antibiotics and 7 courses of prednisone within 12 month. IgM deficiency with subclass deficiency. Good response to  Prevnar in the past. Consider starting on IgG replacement therapy.  Past labs - IgG 855, IgA normal 99, IgM 39L and IgE 10; IgG1 494, IgG2 227L, IgG3 9L, IgG4 50.  Interim history - sinus infection and treat with antibiotics while in the hospital. 2021 IgA, IgM and IgG levels were normal. IgG3 subclass was lower than normal. Pneumococcal, diptheria and tetanus titers present.   Keep track of infections and antibiotics use.  Will consider starting on lower dose of IgG replacement therapy if infections persistent.   Okay to get COVID-19 vaccine.  Keep ENT appointment.  Keep PCP appointment.   Other allergic rhinitis Past history - Perennial rhinoconjunctivitis symptoms for the past 3 to 4 years.  Used Flonase, Singulair and Allegra with some benefit.  Apparently he had 6-7 sinus surgeries due to MRSA infection.  No history of polyps. skin testing a few months ago was positive to ragweed, grass, dust mites and cockroach per patient report.  No previous allergy immunotherapy. Interim history - still having some sinus symptoms from recent acute sinusitis.  Continue environmental control measures as below.  May use over the counter antihistamines such as Zyrtec (cetirizine), Claritin (loratadine), Allegra (fexofenadine), or Xyzal (levocetirizine) daily as needed.  May use Flonase 1 spray per nostril 1-2 times a day for nasal congestion.  Continue with Singulair daily.  Nasal saline spray (i.e., Simply Saline) or nasal saline lavage (i.e., NeilMed) is recommended as needed and prior to medicated nasal sprays.  Keep ENT appointment.   Urticaria Past history - hives outbreak about 5 years ago with no known triggers.  No hives but had some itching.   Return in about 2 months (around 02/14/2020).  Meds ordered this encounter  Medications  . dupilumab (DUPIXENT) prefilled syringe 600 mg   Diagnostics: None.  Medication List:  Current Outpatient Medications  Medication Sig Dispense  Refill  . acyclovir in dextrose 5 % 250 mL 300 mg/m2.    Marland Kitchen albuterol (VENTOLIN HFA) 108 (90 Base) MCG/ACT inhaler Inhale 1-2 puffs into the lungs every 4 (four) hours as needed for wheezing.     Marland Kitchen atorvastatin (LIPITOR) 80 MG tablet Take 80 mg by mouth daily.    . DUPIXENT 300 MG/2ML prefilled syringe Inject 300 mg into the skin every 14 (fourteen) days.     Marland Kitchen EPINEPHrine 0.3 mg/0.3 mL IJ SOAJ injection Inject 0.3 mg into the muscle as needed for anaphylaxis.     . fluticasone (FLONASE) 50 MCG/ACT nasal spray Place 1 spray into both nostrils 2 (two) times daily.    Marland Kitchen gabapentin (NEURONTIN) 600 MG tablet Take 1,200 mg by mouth 2 (two) times daily.     . Insulin Glargine-Lixisenatide (SOLIQUA Superior) Inject 40-60 Units into the skin daily. Depending on CBG's    . JARDIANCE 25 MG TABS tablet Take 25 mg by mouth daily.    . montelukast (SINGULAIR) 10 MG tablet Take 10 mg by mouth daily.    . pantoprazole (PROTONIX) 40 MG tablet Take 40 mg by mouth daily.    . pioglitazone (ACTOS) 45 MG tablet 45 mg.    . valsartan (DIOVAN) 160 MG  tablet Take 160 mg by mouth daily.    Harrel Carina ELLIPTA 200-62.5-25 MCG/INH AEPB INHALE 1 PUFF INTO THE LUNGS DAILY 60 each 1   No current facility-administered medications for this visit.   Allergies: No Known Allergies I reviewed his past medical history, social history, family history, and environmental history and no significant changes have been reported from his previous visit.  Review of Systems  Constitutional: Negative for appetite change, chills, fever and unexpected weight change.  HENT: Positive for congestion, postnasal drip, rhinorrhea, sinus pressure and sinus pain.   Eyes: Negative for itching.  Respiratory: Positive for shortness of breath. Negative for cough, chest tightness and wheezing.   Cardiovascular: Negative for chest pain.  Gastrointestinal: Negative for abdominal pain.  Genitourinary: Negative for difficulty urinating.  Musculoskeletal:  Positive for back pain.  Skin: Negative for rash.  Allergic/Immunologic: Positive for environmental allergies. Negative for food allergies.  Neurological: Positive for headaches.   Objective: BP (!) 115/98 (BP Location: Left Arm, Patient Position: Sitting, Cuff Size: Normal)   Pulse 97   Resp 14   SpO2 97%  There is no height or weight on file to calculate BMI. Physical Exam Vitals and nursing note reviewed.  Constitutional:      Appearance: Normal appearance. He is well-developed.  HENT:     Head: Normocephalic and atraumatic.     Right Ear: Tympanic membrane and external ear normal.     Left Ear: Tympanic membrane and external ear normal.     Nose: Nose normal. No congestion or rhinorrhea.     Mouth/Throat:     Mouth: Mucous membranes are moist.     Pharynx: Oropharynx is clear.  Eyes:     Conjunctiva/sclera: Conjunctivae normal.  Cardiovascular:     Rate and Rhythm: Normal rate and regular rhythm.     Heart sounds: Normal heart sounds. No murmur heard.  No friction rub. No gallop.   Pulmonary:     Effort: Pulmonary effort is normal.     Breath sounds: No wheezing, rhonchi or rales.  Musculoskeletal:     Cervical back: Neck supple.  Skin:    General: Skin is warm.     Findings: No rash.  Neurological:     Mental Status: He is alert and oriented to person, place, and time.  Psychiatric:        Behavior: Behavior normal.    Previous notes and tests were reviewed. The plan was reviewed with the patient/family, and all questions/concerned were addressed.  It was my pleasure to see Leith today and participate in his care. Please feel free to contact me with any questions or concerns.  Sincerely,  Wyline Mood, DO Allergy & Immunology  [facility] office: [phone] [facility] office: [phone] [place] office: [phone]

## 2019-12-15 ENCOUNTER — Other Ambulatory Visit: Payer: Self-pay | Admitting: Allergy

## 2019-12-15 ENCOUNTER — Encounter: Payer: Self-pay | Admitting: Allergy

## 2019-12-15 ENCOUNTER — Ambulatory Visit: Payer: BC Managed Care – PPO

## 2019-12-15 ENCOUNTER — Ambulatory Visit (INDEPENDENT_AMBULATORY_CARE_PROVIDER_SITE_OTHER): Payer: BC Managed Care – PPO | Admitting: Allergy

## 2019-12-15 ENCOUNTER — Other Ambulatory Visit: Payer: Self-pay

## 2019-12-15 VITALS — BP 115/98 | HR 97 | Resp 14

## 2019-12-15 DIAGNOSIS — L509 Urticaria, unspecified: Secondary | ICD-10-CM | POA: Diagnosis not present

## 2019-12-15 DIAGNOSIS — M549 Dorsalgia, unspecified: Secondary | ICD-10-CM

## 2019-12-15 DIAGNOSIS — J4550 Severe persistent asthma, uncomplicated: Secondary | ICD-10-CM

## 2019-12-15 DIAGNOSIS — J3089 Other allergic rhinitis: Secondary | ICD-10-CM

## 2019-12-15 DIAGNOSIS — M545 Low back pain, unspecified: Secondary | ICD-10-CM

## 2019-12-15 DIAGNOSIS — M5412 Radiculopathy, cervical region: Secondary | ICD-10-CM

## 2019-12-15 DIAGNOSIS — Z8709 Personal history of other diseases of the respiratory system: Secondary | ICD-10-CM | POA: Diagnosis not present

## 2019-12-15 DIAGNOSIS — R2689 Other abnormalities of gait and mobility: Secondary | ICD-10-CM

## 2019-12-15 DIAGNOSIS — R293 Abnormal posture: Secondary | ICD-10-CM

## 2019-12-15 DIAGNOSIS — M542 Cervicalgia: Secondary | ICD-10-CM

## 2019-12-15 MED ORDER — DUPILUMAB 300 MG/2ML ~~LOC~~ SOSY
600.0000 mg | PREFILLED_SYRINGE | Freq: Once | SUBCUTANEOUS | Status: AC
  Administered 2019-12-15: 600 mg via SUBCUTANEOUS

## 2019-12-15 NOTE — Patient Instructions (Addendum)
Asthma: . Will check your breathing test next time once your back is feeling better. . Get the flutter valve to do the breathing exercises.  . Continue with Dupixent injections every 2 weeks. o If it's difficult for you to come into our office we can always switch to at home injections.  . Daily controller medication(s): continue with Trelegy 1 puff daily and rinse mouth afterwards. . May use albuterol rescue inhaler 2 puffs every 4 to 6 hours as needed for shortness of breath, chest tightness, coughing, and wheezing. May use albuterol rescue inhaler 2 puffs 5 to 15 minutes prior to strenuous physical activities. Monitor frequency of use.  . Asthma control goals:  o Full participation in all desired activities (may need albuterol before activity) o Albuterol use two times or less a week on average (not counting use with activity) o Cough interfering with sleep two times or less a month o Oral steroids no more than once a year o No hospitalizations  Environmental allergies  Continue environmental control measures as below.  May use over the counter antihistamines such as Zyrtec (cetirizine), Claritin (loratadine), Allegra (fexofenadine), or Xyzal (levocetirizine) daily as needed.  May use Flonase 1 spray per nostril 1-2 times a day for nasal congestion.  Continue with Singulair daily.  Nasal saline spray (i.e., Simply Saline) or nasal saline lavage (i.e., NeilMed) is recommended as needed and prior to medicated nasal sprays.  Infections:  Keep track of infections and antibiotics use.  Okay to get COVID-19 vaccine - Gala Murdoch seems to work best for the Delta variant.  Make sure you don't get the vaccine within 3 days of your Dupixent injections.    Keep ENT appointment.  Keep PCP appointment.   Follow up in 2 months or sooner if needed.   Reducing Pollen Exposure . Pollen seasons: trees (spring), grass (summer) and ragweed/weeds (fall). Marland Kitchen Keep windows closed in your home and car  to lower pollen exposure.  Lilian Kapur air conditioning in the bedroom and throughout the house if possible.  . Avoid going out in dry windy days - especially early morning. . Pollen counts are highest between 5 - 10 AM and on dry, hot and windy days.  . Save outside activities for late afternoon or after a heavy rain, when pollen levels are lower.  . Avoid mowing of grass if you have grass pollen allergy. Marland Kitchen Be aware that pollen can also be transported indoors on people and pets.  . Dry your clothes in an automatic dryer rather than hanging them outside where they might collect pollen.  . Rinse hair and eyes before bedtime.  Control of House Dust Mite Allergen . Dust mite allergens are a common trigger of allergy and asthma symptoms. While they can be found throughout the house, these microscopic creatures thrive in warm, humid environments such as bedding, upholstered furniture and carpeting. . Because so much time is spent in the bedroom, it is essential to reduce mite levels there.  . Encase pillows, mattresses, and box springs in special allergen-proof fabric covers or airtight, zippered plastic covers.  . Bedding should be washed weekly in hot water (130 F) and dried in a hot dryer. Allergen-proof covers are available for comforters and pillows that can't be regularly washed.  Reyes Ivan the allergy-proof covers every few months. Minimize clutter in the bedroom. Keep pets out of the bedroom.  Marland Kitchen Keep humidity less than 50% by using a dehumidifier or air conditioning. You can buy a humidity measuring device  called a hygrometer to monitor this.  . If possible, replace carpets with hardwood, linoleum, or washable area rugs. If that's not possible, vacuum frequently with a vacuum that has a HEPA filter. . Remove all upholstered furniture and non-washable window drapes from the bedroom. . Remove all non-washable stuffed toys from the bedroom.  Wash stuffed toys weekly.  Cockroach Allergen  Avoidance Cockroaches are often found in the homes of densely populated urban areas, schools or commercial buildings, but these creatures can lurk almost anywhere. This does not mean that you have a dirty house or living area. . Block all areas where roaches can enter the home. This includes crevices, wall cracks and windows.  . Cockroaches need water to survive, so fix and seal all leaky faucets and pipes. Have an exterminator go through the house when your family and pets are gone to eliminate any remaining roaches. Marland Kitchen Keep food in lidded containers and put pet food dishes away after your pets are done eating. Vacuum and sweep the floor after meals, and take out garbage and recyclables. Use lidded garbage containers in the kitchen. Wash dishes immediately after use and clean under stoves, refrigerators or toasters where crumbs can accumulate. Wipe off the stove and other kitchen surfaces and cupboards regularly.

## 2019-12-15 NOTE — Therapy (Signed)
[facility] [address] [place], [place], [address] Phone: [phone]   Fax:  [phone]  Physical Therapy therapy Recertification  Patient Details  Name: Bryan Lopez MRN: [id number] Date of Birth: 01-14-1961 Referring Provider (PT): Rodolph Bong, MD   Encounter Date: 12/15/2019   PT End of Session - 12/15/19 1834    Visit Number 3    Number of Visits 13    Date for PT Re-Evaluation 02/06/20    Authorization Type [employer]; recert 12/15/19 with added cervical diagnosis    PT Start Time 1745    PT Stop Time 1830    PT Time Calculation (min) 45 min    Activity Tolerance Patient limited by pain    Behavior During Therapy [facility] for tasks assessed/performed           Past Medical History:  Diagnosis Date  . Crohn disease (HCC)   . Frequent sinus infections   . GERD (gastroesophageal reflux disease)   . History of ear infections   . Hx of bronchitis   . Hyperlipidemia   . Hypertension   . Urticaria     Past Surgical History:  Procedure Laterality Date  . Cardiac Stents    . Sinus Surgeries      There were no vitals filed for this visit.       [facility] PT Assessment - 12/15/19 0001      ROM / Strength   AROM / PROM / Strength AROM      AROM   AROM Assessment Site Cervical    Cervical Flexion 40    Cervical Extension 40    Cervical - Right Rotation 40    Cervical - Left Rotation 45               Treatment: Very light soft tissue mobilization to R upper trap, base of the neck, suboccipital region, R quadratus lumborum and R scaral sulcus  AAROM of cervical spine with flexion and bil cervical rotation: in limited range that did not increase pain-pt is very guarded. Pt reporting dizziness and some visual disturbances with increasing pain  Ice pack: lumbar and neck in supine: 15' at end of the session                    PT Short Term Goals - 12/15/19 1837      PT SHORT TERM  GOAL #1   Title Pt will undergo further assessment of ODI with LTG to be written as appropriate. ALL STGS DUE 12/29/19    Baseline performed on 12/11/19: 55% = severe disability    Time 3    Period Weeks    Status Achieved    Target Date 12/29/19      PT SHORT TERM GOAL #2   Title Pt will report low back pain to a 5/10 or less in order to improve functional mobility.    Baseline 8/10 low back pain    Time 3    Period Weeks    Status New      PT SHORT TERM GOAL #3   Title Pt will demo proper body mechanics to perform a functional squat in order to improve functional mobility and decr low back pain.    Time 3    Period Weeks    Status New      PT SHORT TERM GOAL #4   Title Pt will demo 50 deg of cervical rotation bil with pain <4/10 to improve ability  to turn head while driving    Baseline 40 deg bil cervical rotation with pain of 8/10    Time 4    Period Weeks    Status New    Target Date 01/12/20      PT SHORT TERM GOAL #5   Title Patient will report 50% improvement in intermittent radicular symptoms in R UE with functional activities    Time 4    Period Weeks    Status New    Target Date 01/12/20             PT Long Term Goals - 12/15/19 1839      PT LONG TERM GOAL #1   Title Pt will decr ODI score to at least 45% in order to demo decr disability. ALL LTGS DUE 01/19/20    Baseline 55% on 12/11/19    Time 6    Period Weeks    Status New      PT LONG TERM GOAL #2   Title Pt will report 3/10 or less low back pain in order to improve functional mobility and return to work.    Time 6    Period Weeks    Status New      PT LONG TERM GOAL #3   Title Pt will be independent with initial HEP in order to build upon functional gains made in therapy.    Time 6    Period Weeks    Status New      PT LONG TERM GOAL #4   Title Pt will report being able to perform standing/walking activities for at least 30 minutes in order to improve activity tolerance for work.    Time 6      Period Weeks    Status New      PT LONG TERM GOAL #5   Title Patient will be able to turn head to 60 deg bil with <3/10 pain to improve ability to turn head while driving    Baseline 40 deg with 8/10 pain bil cervical rotation    Time 8    Period Weeks    Status New    Target Date 02/09/20      Additional Long Term Goals   Additional Long Term Goals Yes      PT LONG TERM GOAL #6   Title Patient will be able to carry 40lbs for about 20 lbs to be able to return back to work with modified duty    Baseline not working due to injury    Time 8    Period Weeks    Status New    Target Date 02/09/20                 Plan - 12/15/19 1831    Clinical Impression Statement Patient is reporting severe levels of neck pain and upper back and intermittent radicular symptoms in R UE. Patient is reporting 8/10 pain in neck that is constant. Patient demonstrates signficant limitation in his cervical ROM grossly. Cervical ROM is guarded and painful in all directions. Patient will benefit from skilled physical therapy to improve myofascial restrictions in cervico thoracic and lumbar region, improve strength of cervical and lumbar stabilizers and improve pain levels overall.    Personal Factors and Comorbidities Comorbidity 3+;Time since onset of injury/illness/exacerbation;Past/Current Experience    Comorbidities HLD, HTN, diabetes, hx of sinusitis    Examination-Activity Limitations Bed Mobility;Bend;Locomotion Level;Caring for Others;Stand;Stairs;Squat;Transfers;Sit    Examination-Participation Restrictions Community Activity   work  Stability/Clinical Decision Making Evolving/Moderate complexity    Rehab Potential Good    PT Frequency 2x / week    PT Duration 8 weeks    PT Treatment/Interventions ADLs/Self Care Home Management;Gait training;Stair training;Functional mobility training;Therapeutic activities;Therapeutic exercise;Patient/family education;Manual techniques;Passive range of  motion    PT Next Visit Plan check BP/watch for any dizziness. how is initial HEP? gentle stretching/weight shifting at counter or try standing lumbar extension over counter, core stabilization, any other gentle movement as pt's whole body is very guarded, NuStep, if supine put pt on wedge or with incr pillows - pt can't tolerate any prone position    Consulted and Agree with Plan of Care Patient           Patient will benefit from skilled therapeutic intervention in order to improve the following deficits and impairments:  Abnormal gait, Decreased activity tolerance, Decreased range of motion, Decreased mobility, Decreased strength, Difficulty walking, Hypomobility, Impaired flexibility, Impaired sensation, Increased muscle spasms, Postural dysfunction, Improper body mechanics, Pain  Visit Diagnosis: Other abnormalities of gait and mobility  Abnormal posture  Acute midline low back pain, unspecified whether sciatica present  Neck pain  Radiculopathy, cervical region  Upper back pain     Problem List Patient Active Problem List   Diagnosis Date Noted  . Emesis   . Headache   . Acute recurrent sinusitis   . SIRS (systemic inflammatory response syndrome) (HCC) 11/27/2019  . Acute back pain 11/27/2019  . Fever 11/27/2019  . DM (diabetes mellitus), type 2 (HCC) 11/27/2019  . History of frequent upper respiratory infection 10/07/2019  . Other allergic rhinitis 10/07/2019  . Urticaria 10/07/2019  . Not well controlled severe persistent asthma 10/06/2019    Ileana Ladd, PT 12/15/2019, 6:42 PM   [facility] [address] [place], [place], [address] Phone: [phone]   Fax:  [phone]  Name: Fallon Howerter MRN: [id number] Date of Birth: 03-08-1961

## 2019-12-15 NOTE — Assessment & Plan Note (Addendum)
Past history - Frequent sinusitis, pneumonia, bronchitis and ear infections. 6-8 courses of antibiotics the last 12 months.  Previous allergist notes - Frequent infections needing 10+ courses of antibiotics and 7 courses of prednisone within 12 month. IgM deficiency with subclass deficiency. Good response to Prevnar in the past. Consider starting on IgG replacement therapy.  Past labs - IgG 855, IgA normal 99, IgM 39L and IgE 10; IgG1 494, IgG2 227L, IgG3 9L, IgG4 50.  Interim history - sinus infection and treat with antibiotics while in the hospital. 2021 IgA, IgM and IgG levels were normal. IgG3 subclass was lower than normal. Pneumococcal, diptheria and tetanus titers present.   Keep track of infections and antibiotics use.  Will consider starting on lower dose of IgG replacement therapy if infections persistent.   Okay to get COVID-19 vaccine.  Keep ENT appointment.  Keep PCP appointment.

## 2019-12-15 NOTE — Assessment & Plan Note (Addendum)
Past history - Diagnosed with asthma 3-4 years ago. Currently on Trelegy 200 1 puff daily x 1 month, Singulair, albuterol prn. Recently started on Dupixent injections in [place] and due for his injection. 7-8 courses of prednisone per year with minimal benefit. Has reflux and takes Protonix. Not up to date with COVID-19 vaccine. 2021 spirometry showed some restriction. Normal alpha-1 level. Normal CXR. Interim history - patient's last Dupixent was 2 months ago due to working out of state and recent hospitalization for back pain and fever. Not sure if Markus Daft (given in hospital) worked better than Trelegy. . Get flutter valve.  . Continue with Dupixent injections every 2 weeks. o If it's difficult for you to come into our office we can always switch to at home injections.  . Daily controller medication(s): continue with Trelegy 1 puff daily and rinse mouth afterwards. . May use albuterol rescue inhaler 2 puffs every 4 to 6 hours as needed for shortness of breath, chest tightness, coughing, and wheezing. May use albuterol rescue inhaler 2 puffs 5 to 15 minutes prior to strenuous physical activities. Monitor frequency of use.  . Will get spirometry at next visit instead of today due to COVID-19 pandemic and trying to minimize any type of aerosolizing procedures at this time in the office.

## 2019-12-15 NOTE — Assessment & Plan Note (Signed)
Past history - hives outbreak about 5 years ago with no known triggers.  No hives but had some itching.

## 2019-12-15 NOTE — Assessment & Plan Note (Signed)
Past history - Perennial rhinoconjunctivitis symptoms for the past 3 to 4 years.  Used Flonase, Singulair and Allegra with some benefit.  Apparently he had 6-7 sinus surgeries due to MRSA infection.  No history of polyps. skin testing a few months ago was positive to ragweed, grass, dust mites and cockroach per patient report.  No previous allergy immunotherapy. Interim history - still having some sinus symptoms from recent acute sinusitis.  Continue environmental control measures as below.  May use over the counter antihistamines such as Zyrtec (cetirizine), Claritin (loratadine), Allegra (fexofenadine), or Xyzal (levocetirizine) daily as needed.  May use Flonase 1 spray per nostril 1-2 times a day for nasal congestion.  Continue with Singulair daily.  Nasal saline spray (i.e., Simply Saline) or nasal saline lavage (i.e., NeilMed) is recommended as needed and prior to medicated nasal sprays.  Keep ENT appointment.

## 2019-12-18 ENCOUNTER — Other Ambulatory Visit: Payer: Self-pay

## 2019-12-18 ENCOUNTER — Ambulatory Visit: Payer: BC Managed Care – PPO

## 2019-12-18 DIAGNOSIS — M549 Dorsalgia, unspecified: Secondary | ICD-10-CM

## 2019-12-18 DIAGNOSIS — R293 Abnormal posture: Secondary | ICD-10-CM

## 2019-12-18 DIAGNOSIS — R2689 Other abnormalities of gait and mobility: Secondary | ICD-10-CM | POA: Diagnosis not present

## 2019-12-18 DIAGNOSIS — M5412 Radiculopathy, cervical region: Secondary | ICD-10-CM

## 2019-12-18 DIAGNOSIS — M545 Low back pain, unspecified: Secondary | ICD-10-CM

## 2019-12-18 DIAGNOSIS — M542 Cervicalgia: Secondary | ICD-10-CM

## 2019-12-18 NOTE — Therapy (Addendum)
[facility] [address] [place], [place], [address] Phone: [phone]   Fax:  [phone]  Physical Therapy Treatment  Patient Details  Name: Bryan Lopez MRN: [id number] Date of Birth: 02/20/1961 Referring Provider (PT): Rodolph Bong, MD   Encounter Date: 12/18/2019   PT End of Session - 12/18/19 1510    Visit Number 4    Number of Visits 13    Date for PT Re-Evaluation 02/06/20    Authorization Type BCBS; recert 12/15/19 with added cervical diagnosis    PT Start Time 1450    PT Stop Time 1530    PT Time Calculation (min) 40 min    Equipment Utilized During Treatment Gait belt    Activity Tolerance Patient limited by pain    Behavior During Therapy [facility] for tasks assessed/performed           Past Medical History:  Diagnosis Date  . Crohn disease (HCC)   . Frequent sinus infections   . GERD (gastroesophageal reflux disease)   . History of ear infections   . Hx of bronchitis   . Hyperlipidemia   . Hypertension   . Urticaria     Past Surgical History:  Procedure Laterality Date  . Cardiac Stents    . Sinus Surgeries      There were no vitals filed for this visit.   Subjective Assessment - 12/18/19 1503    Subjective Pt reports neck felt little better for rest of the day after last time. Currently back pain into hips 7/10.    Pertinent History asthma, frequent respiratory infections, HLD, HTN    Limitations Lifting;Standing;Walking;Sitting    How long can you stand comfortably? approx. 15-20 minutes    How long can you walk comfortably? can walk - it just hurts, can walk smaller household distances    Diagnostic tests MRI cervical, lumbar and thoracic spine 11/27/19: No significant abnormality identified. Mild degenerative changes are present.    Patient Stated Goals wants to be able to bend and move and get back to work    Currently in Pain? Yes    Pain Score 7     Pain Location Back    Pain  Orientation Right;Left    Pain Descriptors / Indicators Aching    Pain Onset More than a month ago    Pain Frequency Constant               Myofascial release to clear spinous process borders around L3-5 and S1-2, sacral sulcus-pt reporting radiating pain into ipsilateral hips with very light Myofascial release   Sideling passive, AA and manual resistance with slow reversals of anterior elevation and posterior depression of pelvis R and L; 20x each    Sidelying anterior elevation and posterior depression of L scapulae in R sidelying position: passive, AA and manually resisted slow reversal: 20x Each  Supine hooklying ab bracing with marching: 20x R and L                        PT Short Term Goals - 12/15/19 1837      PT SHORT TERM GOAL #1   Title Pt will undergo further assessment of ODI with LTG to be written as appropriate. ALL STGS DUE 12/29/19    Baseline performed on 12/11/19: 55% = severe disability    Time 3    Period Weeks    Status Achieved    Target Date 12/29/19  PT SHORT TERM GOAL #2   Title Pt will report low back pain to a 5/10 or less in order to improve functional mobility.    Baseline 8/10 low back pain    Time 3    Period Weeks    Status New      PT SHORT TERM GOAL #3   Title Pt will demo proper body mechanics to perform a functional squat in order to improve functional mobility and decr low back pain.    Time 3    Period Weeks    Status New      PT SHORT TERM GOAL #4   Title Pt will demo 50 deg of cervical rotation bil with pain <4/10 to improve ability to turn head while driving    Baseline 40 deg bil cervical rotation with pain of 8/10    Time 4    Period Weeks    Status New    Target Date 01/12/20      PT SHORT TERM GOAL #5   Title Patient will report 50% improvement in intermittent radicular symptoms in R UE with functional activities    Time 4    Period Weeks    Status New    Target Date 01/12/20              PT Long Term Goals - 12/15/19 1839      PT LONG TERM GOAL #1   Title Pt will decr ODI score to at least 45% in order to demo decr disability. ALL LTGS DUE 01/19/20    Baseline 55% on 12/11/19    Time 6    Period Weeks    Status New      PT LONG TERM GOAL #2   Title Pt will report 3/10 or less low back pain in order to improve functional mobility and return to work.    Time 6    Period Weeks    Status New      PT LONG TERM GOAL #3   Title Pt will be independent with initial HEP in order to build upon functional gains made in therapy.    Time 6    Period Weeks    Status New      PT LONG TERM GOAL #4   Title Pt will report being able to perform standing/walking activities for at least 30 minutes in order to improve activity tolerance for work.    Time 6    Period Weeks    Status New      PT LONG TERM GOAL #5   Title Patient will be able to turn head to 60 deg bil with <3/10 pain to improve ability to turn head while driving    Baseline 40 deg with 8/10 pain bil cervical rotation    Time 8    Period Weeks    Status New    Target Date 02/09/20      Additional Long Term Goals   Additional Long Term Goals Yes      PT LONG TERM GOAL #6   Title Patient will be able to carry 40lbs for about 20 lbs to be able to return back to work with modified duty    Baseline not working due to injury    Time 8    Period Weeks    Status New    Target Date 02/09/20                  Patient will benefit from  skilled therapeutic intervention in order to improve the following deficits and impairments:     Visit Diagnosis: Other abnormalities of gait and mobility  Abnormal posture  Acute midline low back pain, unspecified whether sciatica present  Neck pain  Radiculopathy, cervical region  Upper back pain     Problem List Patient Active Problem List   Diagnosis Date Noted  . Emesis   . Headache   . Acute recurrent sinusitis   . SIRS (systemic inflammatory response  syndrome) (HCC) 11/27/2019  . Acute back pain 11/27/2019  . Fever 11/27/2019  . DM (diabetes mellitus), type 2 (HCC) 11/27/2019  . History of frequent upper respiratory infection 10/07/2019  . Other allergic rhinitis 10/07/2019  . Urticaria 10/07/2019  . Not well controlled severe persistent asthma 10/06/2019    Ileana Ladd, PT 12/18/2019, 3:29 PM  [facility] [address] [place], [place], [address] Phone: [phone]   Fax:  [phone]  Name: Hearl Heikes MRN: [id number] Date of Birth: 1960-05-14

## 2019-12-19 LAB — CULTURE, FUNGUS WITHOUT SMEAR

## 2019-12-22 ENCOUNTER — Ambulatory Visit: Payer: BC Managed Care – PPO

## 2019-12-24 ENCOUNTER — Other Ambulatory Visit: Payer: Self-pay

## 2019-12-24 ENCOUNTER — Ambulatory Visit: Payer: BC Managed Care – PPO | Attending: Internal Medicine

## 2019-12-24 DIAGNOSIS — R2689 Other abnormalities of gait and mobility: Secondary | ICD-10-CM | POA: Insufficient documentation

## 2019-12-24 DIAGNOSIS — M542 Cervicalgia: Secondary | ICD-10-CM

## 2019-12-24 DIAGNOSIS — M545 Low back pain, unspecified: Secondary | ICD-10-CM

## 2019-12-24 DIAGNOSIS — M5412 Radiculopathy, cervical region: Secondary | ICD-10-CM | POA: Insufficient documentation

## 2019-12-24 DIAGNOSIS — M549 Dorsalgia, unspecified: Secondary | ICD-10-CM | POA: Insufficient documentation

## 2019-12-24 DIAGNOSIS — R293 Abnormal posture: Secondary | ICD-10-CM | POA: Insufficient documentation

## 2019-12-24 NOTE — Therapy (Signed)
[facility] [address] [place], [place], [address] Phone: [phone]   Fax:  [phone]  Physical Therapy Progress Note Patient Details  Name: Bryan Lopez MRN: [id number] Date of Birth: 1960-10-01 Referring Provider (PT): Rodolph Bong, MD   Encounter Date: 12/24/2019   PT End of Session - 12/24/19 1627    Visit Number 5    Number of Visits 13    Date for PT Re-Evaluation 02/06/20    Authorization Type BCBS; recert 12/15/19 with added cervical diagnosis, PN 12/24/19    Equipment Utilized During Treatment Gait belt    Activity Tolerance Patient limited by pain    Behavior During Therapy [facility] for tasks assessed/performed           Past Medical History:  Diagnosis Date   Crohn disease (HCC)    Frequent sinus infections    GERD (gastroesophageal reflux disease)    History of ear infections    Hx of bronchitis    Hyperlipidemia    Hypertension    Urticaria     Past Surgical History:  Procedure Laterality Date   Cardiac Stents     Sinus Surgeries      There were no vitals filed for this visit.   Subjective Assessment - 12/24/19 1620    Subjective I am getting frustrated because my short time diability is not approved. I have my rent due and bills due and I haven't seen any money coming in. I saw the doctor who is releasing me back to work in 13 days. I am worried that I may not be able to go back to work.    Pertinent History asthma, frequent respiratory infections, HLD, HTN    Limitations Lifting;Standing;Walking;Sitting    How long can you stand comfortably? approx. 15-20 minutes    How long can you walk comfortably? can walk - it just hurts, can walk smaller household distances    Diagnostic tests MRI cervical, lumbar and thoracic spine 11/27/19: No significant abnormality identified. Mild degenerative changes are present.    Patient Stated Goals wants to be able to bend and move and get back to  work    Currently in Pain? Yes    Pain Score 7     Pain Location Back    Pain Orientation Right;Left    Pain Descriptors / Indicators Aching;Tightness;Sharp    Pain Onset More than a month ago    Pain Frequency Constant    Aggravating Factors  movements              [facility] PT Assessment - 12/24/19 0001      Observation/Other Assessments   Other Surveys  Fear Avoidance Belief Questionairre (FABQ)    Fear Avoidance Belief Questionnaire (FABQ)  78%      AROM   Cervical Flexion 40    Cervical Extension 50    Cervical - Right Rotation 35    Cervical - Left Rotation 40              Manual therapy STM to levator scapulae attachment on scapular angles  AA cervical rotations to bil side: 20x AA cervical lateral flexion to bil side: 20x Nustep: level 4 for 10' BIl leg press: 40lbs 20x Seated                     PT Short Term Goals - 12/24/19 1628      PT SHORT TERM GOAL #1   Title Pt will undergo  further assessment of ODI with LTG to be written as appropriate. ALL STGS DUE 12/29/19    Baseline performed on 12/11/19: 55% = severe disability    Time 3    Period Weeks    Status Achieved    Target Date 12/29/19      PT SHORT TERM GOAL #2   Title Pt will report low back pain to a 5/10 or less in order to improve functional mobility.    Baseline 7/10 pain in bakc and neck (12/24/19)    Time 3    Period Weeks    Status On-going      PT SHORT TERM GOAL #3   Title Pt will demo proper body mechanics to perform a functional squat in order to improve functional mobility and decr low back pain.    Baseline pt's movements are very guarded    Time 3    Period Weeks    Status On-going      PT SHORT TERM GOAL #4   Title Pt will demo 50 deg of cervical rotation bil with pain <4/10 to improve ability to turn head while driving    Baseline 40 deg bil cervical rotation with pain of 8/10    Time 4    Period Weeks    Status New    Target Date 01/12/20      PT  SHORT TERM GOAL #5   Title Patient will report 50% improvement in intermittent radicular symptoms in R UE with functional activities    Time 4    Period Weeks    Status On-going    Target Date 01/12/20             PT Long Term Goals - 12/24/19 1629      PT LONG TERM GOAL #1   Title Pt will decr ODI score to at least 45% in order to demo decr disability. ALL LTGS DUE 01/19/20    Baseline 55% on 12/11/19    Time 6    Period Weeks    Status New      PT LONG TERM GOAL #2   Title Pt will report 3/10 or less low back pain in order to improve functional mobility and return to work.    Time 6    Period Weeks    Status New      PT LONG TERM GOAL #3   Title Patient will demo 20 points improvement on FABQ to improve overall function and help to return back to work    Baseline 75/96 (12/24/19)    Time 6    Period Weeks    Status New      PT LONG TERM GOAL #4   Title Pt will report being able to perform standing/walking activities for at least 30 minutes in order to improve activity tolerance for work.    Time 6    Period Weeks    Status New      PT LONG TERM GOAL #5   Title Patient will be able to turn head to 60 deg bil with <3/10 pain to improve ability to turn head while driving    Baseline 40 deg with 8/10 pain bil cervical rotation    Time 8    Period Weeks    Status New      PT LONG TERM GOAL #6   Title Patient will be able to carry 40lbs for about 20 lbs to be able to return back to work with modified duty  Baseline not working due to injury    Time 8    Period Weeks    Status New                 Plan - 12/24/19 1634    Clinical Impression Statement Patient has been seen for total of 5 sessions for neck pain and back pain. patient continues to report 7/10 constant pain in neck and back. Patient continues to reports intermittent symptoms in bil hands. Patient is demonstrating 78% score on Fear Avoidance Belief questionnaire. Patient continues to demonstrate  significant limitation in cervical AROM overall which is guarded by pain.    Personal Factors and Comorbidities Comorbidity 3+;Time since onset of injury/illness/exacerbation;Past/Current Experience    Comorbidities HLD, HTN, diabetes, hx of sinusitis    Examination-Activity Limitations Bed Mobility;Bend;Locomotion Level;Caring for Others;Stand;Stairs;Squat;Transfers;Sit    Examination-Participation Restrictions Community Activity   work   Stability/Clinical Decision Making Evolving/Moderate complexity    Rehab Potential Good    PT Frequency 2x / week    PT Duration 8 weeks    PT Treatment/Interventions ADLs/Self Care Home Management;Gait training;Stair training;Functional mobility training;Therapeutic activities;Therapeutic exercise;Patient/family education;Manual techniques;Passive range of motion    PT Next Visit Plan check BP/watch for any dizziness. how is initial HEP? gentle stretching/weight shifting at counter or try standing lumbar extension over counter, core stabilization, any other gentle movement as pt's whole body is very guarded, NuStep, if supine put pt on wedge or with incr pillows - pt can't tolerate any prone position    Consulted and Agree with Plan of Care Patient           Patient will benefit from skilled therapeutic intervention in order to improve the following deficits and impairments:  Abnormal gait, Decreased activity tolerance, Decreased range of motion, Decreased mobility, Decreased strength, Difficulty walking, Hypomobility, Impaired flexibility, Impaired sensation, Increased muscle spasms, Postural dysfunction, Improper body mechanics, Pain  Visit Diagnosis: Other abnormalities of gait and mobility  Abnormal posture  Acute midline low back pain, unspecified whether sciatica present  Neck pain  Radiculopathy, cervical region  Upper back pain     Problem List Patient Active Problem List   Diagnosis Date Noted   Emesis    Headache    Acute  recurrent sinusitis    SIRS (systemic inflammatory response syndrome) (HCC) 11/27/2019   Acute back pain 11/27/2019   Fever 11/27/2019   DM (diabetes mellitus), type 2 (HCC) 11/27/2019   History of frequent upper respiratory infection 10/07/2019   Other allergic rhinitis 10/07/2019   Urticaria 10/07/2019   Not well controlled severe persistent asthma 10/06/2019    Ileana Ladd, PT 12/24/2019, 4:54 PM  [facility] [address] [place], [place], [address] Phone: [phone]   Fax:  [phone]  Name: Bryan Lopez MRN: [id number] Date of Birth: 1961-02-16

## 2019-12-29 ENCOUNTER — Other Ambulatory Visit: Payer: Self-pay

## 2019-12-29 ENCOUNTER — Ambulatory Visit (INDEPENDENT_AMBULATORY_CARE_PROVIDER_SITE_OTHER): Payer: BC Managed Care – PPO

## 2019-12-29 DIAGNOSIS — J4550 Severe persistent asthma, uncomplicated: Secondary | ICD-10-CM

## 2019-12-29 MED ORDER — DUPILUMAB 300 MG/2ML ~~LOC~~ SOSY
300.0000 mg | PREFILLED_SYRINGE | SUBCUTANEOUS | Status: AC
  Administered 2019-12-29 – 2020-09-08 (×16): 300 mg via SUBCUTANEOUS

## 2019-12-31 ENCOUNTER — Other Ambulatory Visit: Payer: Self-pay

## 2019-12-31 ENCOUNTER — Ambulatory Visit: Payer: BC Managed Care – PPO

## 2019-12-31 DIAGNOSIS — R293 Abnormal posture: Secondary | ICD-10-CM | POA: Diagnosis present

## 2019-12-31 DIAGNOSIS — M542 Cervicalgia: Secondary | ICD-10-CM | POA: Diagnosis present

## 2019-12-31 DIAGNOSIS — M5412 Radiculopathy, cervical region: Secondary | ICD-10-CM

## 2019-12-31 DIAGNOSIS — M549 Dorsalgia, unspecified: Secondary | ICD-10-CM

## 2019-12-31 DIAGNOSIS — R2689 Other abnormalities of gait and mobility: Secondary | ICD-10-CM

## 2019-12-31 DIAGNOSIS — M545 Low back pain, unspecified: Secondary | ICD-10-CM

## 2019-12-31 NOTE — Therapy (Signed)
[facility] [address] [place], [place], [address] Phone: [phone]   Fax:  [phone]  Physical Therapy Treatment  Patient Details  Name: Bryan Lopez MRN: [id number] Date of Birth: Aug 24, 1960 Referring Provider (PT): Rodolph Bong, MD   Encounter Date: 12/31/2019   PT End of Session - 12/31/19 1812    Visit Number 6    Number of Visits 13    Date for PT Re-Evaluation 02/06/20    Authorization Type BCBS; recert 12/15/19 with added cervical diagnosis, PN 12/24/19    PT Start Time 1745    PT Stop Time 1830    PT Time Calculation (min) 45 min    Equipment Utilized During Treatment Gait belt    Activity Tolerance Patient limited by pain    Behavior During Therapy [facility] for tasks assessed/performed           Past Medical History:  Diagnosis Date  . Crohn disease (HCC)   . Frequent sinus infections   . GERD (gastroesophageal reflux disease)   . History of ear infections   . Hx of bronchitis   . Hyperlipidemia   . Hypertension   . Urticaria     Past Surgical History:  Procedure Laterality Date  . Cardiac Stents    . Sinus Surgeries      There were no vitals filed for this visit.   Subjective Assessment - 12/31/19 1751    Subjective Pain is getting better. I am trying to walk more. Diability hasn't been approved. I have got bills piling up. Pain is in lower back and going down to R buttocks and R upper hamstrings. NEck is moving better but still pain at base of my neck. Neck still has lot of cracking when I move.    Pertinent History asthma, frequent respiratory infections, HLD, HTN    Limitations Lifting;Standing;Walking;Sitting    How long can you stand comfortably? approx. 15-20 minutes    How long can you walk comfortably? can walk - it just hurts, can walk smaller household distances    Diagnostic tests MRI cervical, lumbar and thoracic spine 11/27/19: No significant abnormality identified. Mild  degenerative changes are present.    Patient Stated Goals wants to be able to bend and move and get back to work    Pain Score 5     Pain Onset More than a month ago               Grade I-II unilateral PA mobilization on C7 on R, T4-8 on R, L5 bil, sacrum bil Prone manually resisted with slow reversal of hip internal rotation and external rotation: 2 x 20 R and L Seated cervical lateral flexionL fulcrum around C4: 20x R and L- performing L lateral flexion provoked entire R arm going numb- performed in sitting positions Seated cervical rotations bil with unilateral cervical PA mobilization performed in sitting position: 30x R and L Seated cervical extensions: 10x Standing lumbar extension: 10x      Ice given to low back during neck exercises: 10' Pt was asked to lie down to reduce gravity on neck to see if that can improve his numbness.                  PT Short Term Goals - 12/24/19 1628      PT SHORT TERM GOAL #1   Title Pt will undergo further assessment of ODI with LTG to be written as appropriate. ALL STGS DUE 12/29/19  Baseline performed on 12/11/19: 55% = severe disability    Time 3    Period Weeks    Status Achieved    Target Date 12/29/19      PT SHORT TERM GOAL #2   Title Pt will report low back pain to a 5/10 or less in order to improve functional mobility.    Baseline 7/10 pain in bakc and neck (12/24/19)    Time 3    Period Weeks    Status On-going      PT SHORT TERM GOAL #3   Title Pt will demo proper body mechanics to perform a functional squat in order to improve functional mobility and decr low back pain.    Baseline pt's movements are very guarded    Time 3    Period Weeks    Status On-going      PT SHORT TERM GOAL #4   Title Pt will demo 50 deg of cervical rotation bil with pain <4/10 to improve ability to turn head while driving    Baseline 40 deg bil cervical rotation with pain of 8/10    Time 4    Period Weeks    Status New     Target Date 01/12/20      PT SHORT TERM GOAL #5   Title Patient will report 50% improvement in intermittent radicular symptoms in R UE with functional activities    Time 4    Period Weeks    Status On-going    Target Date 01/12/20             PT Long Term Goals - 12/24/19 1629      PT LONG TERM GOAL #1   Title Pt will decr ODI score to at least 45% in order to demo decr disability. ALL LTGS DUE 01/19/20    Baseline 55% on 12/11/19    Time 6    Period Weeks    Status New      PT LONG TERM GOAL #2   Title Pt will report 3/10 or less low back pain in order to improve functional mobility and return to work.    Time 6    Period Weeks    Status New      PT LONG TERM GOAL #3   Title Patient will demo 20 points improvement on FABQ to improve overall function and help to return back to work    Baseline 75/96 (12/24/19)    Time 6    Period Weeks    Status New      PT LONG TERM GOAL #4   Title Pt will report being able to perform standing/walking activities for at least 30 minutes in order to improve activity tolerance for work.    Time 6    Period Weeks    Status New      PT LONG TERM GOAL #5   Title Patient will be able to turn head to 60 deg bil with <3/10 pain to improve ability to turn head while driving    Baseline 40 deg with 8/10 pain bil cervical rotation    Time 8    Period Weeks    Status New      PT LONG TERM GOAL #6   Title Patient will be able to carry 40lbs for about 20 lbs to be able to return back to work with modified duty    Baseline not working due to injury    Time 8    Period Weeks  Status New                 Plan - 12/31/19 1812    Clinical Impression Statement Today's session was focused on improving vertebral mobility in spinpe, improving soft tissue restrictions and improving neuromuscular control in bil hips and pelvis and cervical spine. Pt reported improved pain at end of the session.    Personal Factors and Comorbidities Comorbidity  3+;Time since onset of injury/illness/exacerbation;Past/Current Experience    Comorbidities HLD, HTN, diabetes, hx of sinusitis    Examination-Activity Limitations Bed Mobility;Bend;Locomotion Level;Caring for Others;Stand;Stairs;Squat;Transfers;Sit    Examination-Participation Restrictions Community Activity   work   Stability/Clinical Decision Making Evolving/Moderate complexity    Rehab Potential Good    PT Frequency 2x / week    PT Duration 8 weeks    PT Treatment/Interventions ADLs/Self Care Home Management;Gait training;Stair training;Functional mobility training;Therapeutic activities;Therapeutic exercise;Patient/family education;Manual techniques;Passive range of motion    PT Next Visit Plan check BP/watch for any dizziness. how is initial HEP? gentle stretching/weight shifting at counter or try standing lumbar extension over counter, core stabilization, any other gentle movement as pt's whole body is very guarded, NuStep, if supine put pt on wedge or with incr pillows - pt can't tolerate any prone position    Consulted and Agree with Plan of Care Patient           Patient will benefit from skilled therapeutic intervention in order to improve the following deficits and impairments:  Abnormal gait, Decreased activity tolerance, Decreased range of motion, Decreased mobility, Decreased strength, Difficulty walking, Hypomobility, Impaired flexibility, Impaired sensation, Increased muscle spasms, Postural dysfunction, Improper body mechanics, Pain  Visit Diagnosis: Other abnormalities of gait and mobility  Abnormal posture  Acute midline low back pain, unspecified whether sciatica present  Neck pain  Radiculopathy, cervical region  Upper back pain     Problem List Patient Active Problem List   Diagnosis Date Noted  . Emesis   . Headache   . Acute recurrent sinusitis   . SIRS (systemic inflammatory response syndrome) (HCC) 11/27/2019  . Acute back pain 11/27/2019  . Fever  11/27/2019  . DM (diabetes mellitus), type 2 (HCC) 11/27/2019  . History of frequent upper respiratory infection 10/07/2019  . Other allergic rhinitis 10/07/2019  . Urticaria 10/07/2019  . Not well controlled severe persistent asthma 10/06/2019    Ileana Ladd, PT 12/31/2019, 6:48 PM  [facility] [address] [place], [place], [address] Phone: [phone]   Fax:  [phone]  Name: Bryan Lopez MRN: [id number] Date of Birth: 02/13/1961

## 2020-01-05 ENCOUNTER — Other Ambulatory Visit: Payer: Self-pay

## 2020-01-05 ENCOUNTER — Ambulatory Visit: Payer: BC Managed Care – PPO

## 2020-01-05 DIAGNOSIS — R2689 Other abnormalities of gait and mobility: Secondary | ICD-10-CM

## 2020-01-05 DIAGNOSIS — M545 Low back pain, unspecified: Secondary | ICD-10-CM

## 2020-01-05 DIAGNOSIS — R293 Abnormal posture: Secondary | ICD-10-CM

## 2020-01-05 DIAGNOSIS — M5412 Radiculopathy, cervical region: Secondary | ICD-10-CM

## 2020-01-05 DIAGNOSIS — M549 Dorsalgia, unspecified: Secondary | ICD-10-CM

## 2020-01-05 DIAGNOSIS — M542 Cervicalgia: Secondary | ICD-10-CM

## 2020-01-05 NOTE — Therapy (Signed)
[facility] [address] [place], [place], [address] Phone: [phone]   Fax:  [phone]  Physical Therapy Treatment  Patient Details  Name: Bryan Lopez MRN: [id number] Date of Birth: 10/16/1960 Referring Provider (PT): Rodolph Bong, MD   Encounter Date: 01/05/2020   PT End of Session - 01/05/20 1618    Visit Number 7    Number of Visits 13    Date for PT Re-Evaluation 02/06/20    Authorization Type BCBS; recert 12/15/19 with added cervical diagnosis, PN 12/24/19    PT Start Time 1530    PT Stop Time 1615    PT Time Calculation (min) 45 min    Equipment Utilized During Treatment Gait belt    Activity Tolerance Patient limited by pain    Behavior During Therapy [facility] for tasks assessed/performed           Past Medical History:  Diagnosis Date  . Crohn disease (HCC)   . Frequent sinus infections   . GERD (gastroesophageal reflux disease)   . History of ear infections   . Hx of bronchitis   . Hyperlipidemia   . Hypertension   . Urticaria     Past Surgical History:  Procedure Laterality Date  . Cardiac Stents    . Sinus Surgeries      There were no vitals filed for this visit.   Subjective Assessment - 01/05/20 1536    Subjective Pain is going down to my R butt cheek but not lower than that. Pain is present in L side of low back but not as bad as right side.    Pertinent History asthma, frequent respiratory infections, HLD, HTN    Limitations Lifting;Standing;Walking;Sitting    How long can you stand comfortably? approx. 15-20 minutes    How long can you walk comfortably? can walk - it just hurts, can walk smaller household distances    Diagnostic tests MRI cervical, lumbar and thoracic spine 11/27/19: No significant abnormality identified. Mild degenerative changes are present.    Patient Stated Goals wants to be able to bend and move and get back to work    Pain Score 6     Pain Location Back     Pain Onset More than a month ago              [facility] PT Assessment - 01/05/20 1612      AROM   Cervical Flexion 45    Cervical Extension 55    Cervical - Right Side Bend 30    Cervical - Left Side Bend 30    Cervical - Right Rotation 60    Cervical - Left Rotation 65                  Grade I-II unilateral PA mobilization on C7 on R, T4-8 on R, L5 bil, sacrum bil Prone manually resisted with slow reversal of hip internal rotation and external rotation: 2 x 20 R and L Seated cervical lateral flexionL fulcrum around C4: 20x R and L- performing L lateral flexion provoked entire R arm going numb- performed in sitting positions Seated cervical rotations bil with unilateral cervical PA mobilization performed in sitting position: 30x R and L Seated cervical extensions: 10x Standing lumbar extension: 10x                       PT Short Term Goals - 12/24/19 1628      PT SHORT TERM  GOAL #1   Title Pt will undergo further assessment of ODI with LTG to be written as appropriate. ALL STGS DUE 12/29/19    Baseline performed on 12/11/19: 55% = severe disability    Time 3    Period Weeks    Status Achieved    Target Date 12/29/19      PT SHORT TERM GOAL #2   Title Pt will report low back pain to a 5/10 or less in order to improve functional mobility.    Baseline 7/10 pain in bakc and neck (12/24/19)    Time 3    Period Weeks    Status On-going      PT SHORT TERM GOAL #3   Title Pt will demo proper body mechanics to perform a functional squat in order to improve functional mobility and decr low back pain.    Baseline pt's movements are very guarded    Time 3    Period Weeks    Status On-going      PT SHORT TERM GOAL #4   Title Pt will demo 50 deg of cervical rotation bil with pain <4/10 to improve ability to turn head while driving    Baseline 40 deg bil cervical rotation with pain of 8/10    Time 4    Period Weeks    Status New    Target Date 01/12/20        PT SHORT TERM GOAL #5   Title Patient will report 50% improvement in intermittent radicular symptoms in R UE with functional activities    Time 4    Period Weeks    Status On-going    Target Date 01/12/20             PT Long Term Goals - 12/24/19 1629      PT LONG TERM GOAL #1   Title Pt will decr ODI score to at least 45% in order to demo decr disability. ALL LTGS DUE 01/19/20    Baseline 55% on 12/11/19    Time 6    Period Weeks    Status New      PT LONG TERM GOAL #2   Title Pt will report 3/10 or less low back pain in order to improve functional mobility and return to work.    Time 6    Period Weeks    Status New      PT LONG TERM GOAL #3   Title Patient will demo 20 points improvement on FABQ to improve overall function and help to return back to work    Baseline 75/96 (12/24/19)    Time 6    Period Weeks    Status New      PT LONG TERM GOAL #4   Title Pt will report being able to perform standing/walking activities for at least 30 minutes in order to improve activity tolerance for work.    Time 6    Period Weeks    Status New      PT LONG TERM GOAL #5   Title Patient will be able to turn head to 60 deg bil with <3/10 pain to improve ability to turn head while driving    Baseline 40 deg with 8/10 pain bil cervical rotation    Time 8    Period Weeks    Status New      PT LONG TERM GOAL #6   Title Patient will be able to carry 40lbs for about 20 lbs to be able  to return back to work with modified duty    Baseline not working due to injury    Time 8    Period Weeks    Status New                 Plan - 01/05/20 1617    Clinical Impression Statement Pt is reporting gradual improvement in his pain. Pt has demonstrated significant improvement in his cervical ROM with R cervical rotation limited mildly still. Patient is reporting centralization of symptoms with manual therapy exercises.    Personal Factors and Comorbidities Comorbidity 3+;Time since onset  of injury/illness/exacerbation;Past/Current Experience    Comorbidities HLD, HTN, diabetes, hx of sinusitis    Examination-Activity Limitations Bed Mobility;Bend;Locomotion Level;Caring for Others;Stand;Stairs;Squat;Transfers;Sit    Examination-Participation Restrictions Community Activity   work   Stability/Clinical Decision Making Evolving/Moderate complexity    Rehab Potential Good    PT Frequency 2x / week    PT Duration 8 weeks    PT Treatment/Interventions ADLs/Self Care Home Management;Gait training;Stair training;Functional mobility training;Therapeutic activities;Therapeutic exercise;Patient/family education;Manual techniques;Passive range of motion    PT Next Visit Plan check BP/watch for any dizziness. how is initial HEP? gentle stretching/weight shifting at counter or try standing lumbar extension over counter, core stabilization, any other gentle movement as pt's whole body is very guarded, NuStep, if supine put pt on wedge or with incr pillows - pt can't tolerate any prone position    Consulted and Agree with Plan of Care Patient           Patient will benefit from skilled therapeutic intervention in order to improve the following deficits and impairments:  Abnormal gait, Decreased activity tolerance, Decreased range of motion, Decreased mobility, Decreased strength, Difficulty walking, Hypomobility, Impaired flexibility, Impaired sensation, Increased muscle spasms, Postural dysfunction, Improper body mechanics, Pain  Visit Diagnosis: Other abnormalities of gait and mobility  Abnormal posture  Acute midline low back pain, unspecified whether sciatica present  Neck pain  Radiculopathy, cervical region  Upper back pain     Problem List Patient Active Problem List   Diagnosis Date Noted  . Emesis   . Headache   . Acute recurrent sinusitis   . SIRS (systemic inflammatory response syndrome) (HCC) 11/27/2019  . Acute back pain 11/27/2019  . Fever 11/27/2019  . DM  (diabetes mellitus), type 2 (HCC) 11/27/2019  . History of frequent upper respiratory infection 10/07/2019  . Other allergic rhinitis 10/07/2019  . Urticaria 10/07/2019  . Not well controlled severe persistent asthma 10/06/2019    Ileana Ladd, PT 01/05/2020, 7:09 PM  [facility] [address] [place], [place], [address] Phone: [phone]   Fax:  [phone]  Name: Jonus Coble MRN: [id number] Date of Birth: [birth year]

## 2020-01-07 ENCOUNTER — Other Ambulatory Visit: Payer: Self-pay

## 2020-01-07 ENCOUNTER — Ambulatory Visit: Payer: BC Managed Care – PPO

## 2020-01-07 DIAGNOSIS — M545 Low back pain, unspecified: Secondary | ICD-10-CM

## 2020-01-07 DIAGNOSIS — M5412 Radiculopathy, cervical region: Secondary | ICD-10-CM

## 2020-01-07 DIAGNOSIS — M549 Dorsalgia, unspecified: Secondary | ICD-10-CM

## 2020-01-07 DIAGNOSIS — M542 Cervicalgia: Secondary | ICD-10-CM

## 2020-01-07 DIAGNOSIS — R2689 Other abnormalities of gait and mobility: Secondary | ICD-10-CM

## 2020-01-07 DIAGNOSIS — R293 Abnormal posture: Secondary | ICD-10-CM

## 2020-01-07 NOTE — Therapy (Signed)
[facility] [address] [place], [place], [address] Phone: [phone]   Fax:  [phone]  Physical Therapy Treatment  Patient Details  Name: Bryan Lopez MRN: [id number] Date of Birth: 03-25-1961 Referring Provider (PT): Rodolph Bong, MD   Encounter Date: 01/07/2020   PT End of Session - 01/07/20 1901    Visit Number 8    Number of Visits 13    Date for PT Re-Evaluation 02/06/20    Authorization Type [employer]BS; recert 12/15/19 with added cervical diagnosis, PN 12/24/19    PT Start Time 1530    PT Stop Time 1615    PT Time Calculation (min) 45 min    Equipment Utilized During Treatment Gait belt    Activity Tolerance Patient limited by pain    Behavior During Therapy [facility] for tasks assessed/performed           Past Medical History:  Diagnosis Date  . Crohn disease (HCC)   . Frequent sinus infections   . GERD (gastroesophageal reflux disease)   . History of ear infections   . Hx of bronchitis   . Hyperlipidemia   . Hypertension   . Urticaria     Past Surgical History:  Procedure Laterality Date  . Cardiac Stents    . Sinus Surgeries      There were no vitals filed for this visit.   Subjective Assessment - 01/07/20 1612    Subjective I started my work and it has been couple of days. It has been rough for last couple of days as I am back to work full duty. Currently pain is going down to R posterior knee. Pain is 8/10    Pertinent History asthma, frequent respiratory infections, HLD, HTN    Limitations Lifting;Standing;Walking;Sitting    How long can you stand comfortably? approx. 15-20 minutes    How long can you walk comfortably? can walk - it just hurts, can walk smaller household distances    Diagnostic tests MRI cervical, lumbar and thoracic spine 11/27/19: No significant abnormality identified. Mild degenerative changes are present.    Patient Stated Goals wants to be able to bend and move and get  back to work    Currently in Pain? Yes    Pain Score 8     Pain Location Back    Pain Orientation Right    Pain Descriptors / Indicators Aching;Radiating    Pain Onset More than a month ago                 Grade III PA mobilizations L4-5, and sacrum Myofascial release to bil lumbar praspinalis, sacral sulcus Manually stretched bil hip extensors Prone quad stretch: 5 x 20" R and L Prone manually resisted hip internal rotation and erectile dysfunction: 3 x 10 R and L Standing lumbar extension: 10x Pt offered ice at home, pt wanted to do ice at home.                         PT Short Term Goals - 12/24/19 1628      PT SHORT TERM GOAL #1   Title Pt will undergo further assessment of ODI with LTG to be written as appropriate. ALL STGS DUE 12/29/19    Baseline performed on 12/11/19: 55% = severe disability    Time 3    Period Weeks    Status Achieved    Target Date 12/29/19      PT SHORT TERM  GOAL #2   Title Pt will report low back pain to a 5/10 or less in order to improve functional mobility.    Baseline 7/10 pain in bakc and neck (12/24/19)    Time 3    Period Weeks    Status On-going      PT SHORT TERM GOAL #3   Title Pt will demo proper body mechanics to perform a functional squat in order to improve functional mobility and decr low back pain.    Baseline pt's movements are very guarded    Time 3    Period Weeks    Status On-going      PT SHORT TERM GOAL #4   Title Pt will demo 50 deg of cervical rotation bil with pain <4/10 to improve ability to turn head while driving    Baseline 40 deg bil cervical rotation with pain of 8/10    Time 4    Period Weeks    Status New    Target Date 01/12/20      PT SHORT TERM GOAL #5   Title Patient will report 50% improvement in intermittent radicular symptoms in R UE with functional activities    Time 4    Period Weeks    Status On-going    Target Date 01/12/20             PT Long Term Goals -  12/24/19 1629      PT LONG TERM GOAL #1   Title Pt will decr ODI score to at least 45% in order to demo decr disability. ALL LTGS DUE 01/19/20    Baseline 55% on 12/11/19    Time 6    Period Weeks    Status New      PT LONG TERM GOAL #2   Title Pt will report 3/10 or less low back pain in order to improve functional mobility and return to work.    Time 6    Period Weeks    Status New      PT LONG TERM GOAL #3   Title Patient will demo 20 points improvement on FABQ to improve overall function and help to return back to work    Baseline 75/96 (12/24/19)    Time 6    Period Weeks    Status New      PT LONG TERM GOAL #4   Title Pt will report being able to perform standing/walking activities for at least 30 minutes in order to improve activity tolerance for work.    Time 6    Period Weeks    Status New      PT LONG TERM GOAL #5   Title Patient will be able to turn head to 60 deg bil with <3/10 pain to improve ability to turn head while driving    Baseline 40 deg with 8/10 pain bil cervical rotation    Time 8    Period Weeks    Status New      PT LONG TERM GOAL #6   Title Patient will be able to carry 40lbs for about 20 lbs to be able to return back to work with modified duty    Baseline not working due to injury    Time 8    Period Weeks    Status New                 Plan - 01/07/20 1613    Personal Factors and Comorbidities Comorbidity 3+;Time since onset of  injury/illness/exacerbation;Past/Current Experience    Comorbidities HLD, HTN, diabetes, hx of sinusitis    Examination-Activity Limitations Bed Mobility;Bend;Locomotion Level;Caring for Others;Stand;Stairs;Squat;Transfers;Sit    Examination-Participation Restrictions Community Activity   work   Stability/Clinical Decision Making Evolving/Moderate complexity    Rehab Potential Good    PT Frequency 2x / week    PT Duration 8 weeks    PT Treatment/Interventions ADLs/Self Care Home Management;Gait training;Stair  training;Functional mobility training;Therapeutic activities;Therapeutic exercise;Patient/family education;Manual techniques;Passive range of motion    PT Next Visit Plan check BP/watch for any dizziness. how is initial HEP? gentle stretching/weight shifting at counter or try standing lumbar extension over counter, core stabilization, any other gentle movement as pt's whole body is very guarded, NuStep, if supine put pt on wedge or with incr pillows - pt can't tolerate any prone position    Consulted and Agree with Plan of Care Patient           Patient will benefit from skilled therapeutic intervention in order to improve the following deficits and impairments:  Abnormal gait, Decreased activity tolerance, Decreased range of motion, Decreased mobility, Decreased strength, Difficulty walking, Hypomobility, Impaired flexibility, Impaired sensation, Increased muscle spasms, Postural dysfunction, Improper body mechanics, Pain  Visit Diagnosis: Other abnormalities of gait and mobility  Abnormal posture  Acute midline low back pain, unspecified whether sciatica present  Neck pain  Radiculopathy, cervical region  Upper back pain     Problem List Patient Active Problem List   Diagnosis Date Noted  . Emesis   . Headache   . Acute recurrent sinusitis   . SIRS (systemic inflammatory response syndrome) (HCC) 11/27/2019  . Acute back pain 11/27/2019  . Fever 11/27/2019  . DM (diabetes mellitus), type 2 (HCC) 11/27/2019  . History of frequent upper respiratory infection 10/07/2019  . Other allergic rhinitis 10/07/2019  . Urticaria 10/07/2019  . Not well controlled severe persistent asthma 10/06/2019    Ileana Ladd 01/07/2020, 7:03 PM  [facility] [address] [place], [place], [address] Phone: [phone]   Fax:  [phone]  Name: Bryan Lopez MRN: [id number] Date of Birth: Aug 29, 1960

## 2020-01-12 ENCOUNTER — Other Ambulatory Visit: Payer: Self-pay

## 2020-01-12 ENCOUNTER — Ambulatory Visit: Payer: BC Managed Care – PPO

## 2020-01-12 DIAGNOSIS — M549 Dorsalgia, unspecified: Secondary | ICD-10-CM

## 2020-01-12 DIAGNOSIS — R293 Abnormal posture: Secondary | ICD-10-CM

## 2020-01-12 DIAGNOSIS — M5412 Radiculopathy, cervical region: Secondary | ICD-10-CM

## 2020-01-12 DIAGNOSIS — M542 Cervicalgia: Secondary | ICD-10-CM

## 2020-01-12 DIAGNOSIS — M545 Low back pain, unspecified: Secondary | ICD-10-CM

## 2020-01-12 DIAGNOSIS — R2689 Other abnormalities of gait and mobility: Secondary | ICD-10-CM

## 2020-01-12 NOTE — Therapy (Signed)
[facility] [address] [place], [place], [address] Phone: [phone]   Fax:  [phone]  Physical Therapy Treatment  Patient Details  Name: Bryan Lopez MRN: [id number] Date of Birth: 04/05/1961 Referring Provider (PT): Rodolph Bong, MD   Encounter Date: 01/12/2020   PT End of Session - 01/12/20 1559    Visit Number 9    Number of Visits 13    Date for PT Re-Evaluation 02/06/20    Authorization Type BCBS; recert 12/15/19 with added cervical diagnosis, PN 12/24/19    PT Start Time 1530    PT Stop Time 1615    PT Time Calculation (min) 45 min    Equipment Utilized During Treatment Gait belt    Activity Tolerance Patient limited by pain    Behavior During Therapy [facility] for tasks assessed/performed           Past Medical History:  Diagnosis Date  . Crohn disease (HCC)   . Frequent sinus infections   . GERD (gastroesophageal reflux disease)   . History of ear infections   . Hx of bronchitis   . Hyperlipidemia   . Hypertension   . Urticaria     Past Surgical History:  Procedure Laterality Date  . Cardiac Stents    . Sinus Surgeries      There were no vitals filed for this visit.   Subjective Assessment - 01/12/20 1556    Subjective Pt reports back pain was better today. It didn't start bothering me until noon at work. TOday my neck and upper is more sore.    Pertinent History asthma, frequent respiratory infections, HLD, HTN    Limitations Lifting;Standing;Walking;Sitting    How long can you stand comfortably? approx. 15-20 minutes    How long can you walk comfortably? can walk - it just hurts, can walk smaller household distances    Diagnostic tests MRI cervical, lumbar and thoracic spine 11/27/19: No significant abnormality identified. Mild degenerative changes are present.    Patient Stated Goals wants to be able to bend and move and get back to work    Pain Onset More than a month ago                Manual therapy Unilateral and central PA mobilization over L5 and S1on L side Myofascial release to bil lumbar spine Grade II central PA mobilization in upper thoracic spine   TherEx: Foam roller thoracic extensions: 5 x 30" with hands behind head. Foam roller pec stretch: 2 x 2' R sidelying left scapular upward elevation and posterior depression: AAROM, AROM: 10' with slow reversal and isometrics thoughout                        PT Short Term Goals - 12/24/19 1628      PT SHORT TERM GOAL #1   Title Pt will undergo further assessment of ODI with LTG to be written as appropriate. ALL STGS DUE 12/29/19    Baseline performed on 12/11/19: 55% = severe disability    Time 3    Period Weeks    Status Achieved    Target Date 12/29/19      PT SHORT TERM GOAL #2   Title Pt will report low back pain to a 5/10 or less in order to improve functional mobility.    Baseline 7/10 pain in bakc and neck (12/24/19)    Time 3    Period Weeks  Status On-going      PT SHORT TERM GOAL #3   Title Pt will demo proper body mechanics to perform a functional squat in order to improve functional mobility and decr low back pain.    Baseline pt's movements are very guarded    Time 3    Period Weeks    Status On-going      PT SHORT TERM GOAL #4   Title Pt will demo 50 deg of cervical rotation bil with pain <4/10 to improve ability to turn head while driving    Baseline 40 deg bil cervical rotation with pain of 8/10    Time 4    Period Weeks    Status New    Target Date 01/12/20      PT SHORT TERM GOAL #5   Title Patient will report 50% improvement in intermittent radicular symptoms in R UE with functional activities    Time 4    Period Weeks    Status On-going    Target Date 01/12/20             PT Long Term Goals - 12/24/19 1629      PT LONG TERM GOAL #1   Title Pt will decr ODI score to at least 45% in order to demo decr disability. ALL LTGS DUE 01/19/20     Baseline 55% on 12/11/19    Time 6    Period Weeks    Status New      PT LONG TERM GOAL #2   Title Pt will report 3/10 or less low back pain in order to improve functional mobility and return to work.    Time 6    Period Weeks    Status New      PT LONG TERM GOAL #3   Title Patient will demo 20 points improvement on FABQ to improve overall function and help to return back to work    Baseline 75/96 (12/24/19)    Time 6    Period Weeks    Status New      PT LONG TERM GOAL #4   Title Pt will report being able to perform standing/walking activities for at least 30 minutes in order to improve activity tolerance for work.    Time 6    Period Weeks    Status New      PT LONG TERM GOAL #5   Title Patient will be able to turn head to 60 deg bil with <3/10 pain to improve ability to turn head while driving    Baseline 40 deg with 8/10 pain bil cervical rotation    Time 8    Period Weeks    Status New      PT LONG TERM GOAL #6   Title Patient will be able to carry 40lbs for about 20 lbs to be able to return back to work with modified duty    Baseline not working due to injury    Time 8    Period Weeks    Status New                 Plan - 01/12/20 1558    Clinical Impression Statement Today's session was focused on cotinued improvement in spinal mobility in upper thoracic and lower lumbar spine. Patient reported pain improved from 6-7/10 to 4/10 at end of the session. Patient was sore with grade II mobilization in upper thoracic spine.    Personal Factors and Comorbidities Comorbidity 3+;Time since onset  of injury/illness/exacerbation;Past/Current Experience    Comorbidities HLD, HTN, diabetes, hx of sinusitis    Examination-Activity Limitations Bed Mobility;Bend;Locomotion Level;Caring for Others;Stand;Stairs;Squat;Transfers;Sit    Examination-Participation Restrictions Community Activity   work   Stability/Clinical Decision Making Evolving/Moderate complexity    Rehab  Potential Good    PT Frequency 2x / week    PT Duration 8 weeks    PT Treatment/Interventions ADLs/Self Care Home Management;Gait training;Stair training;Functional mobility training;Therapeutic activities;Therapeutic exercise;Patient/family education;Manual techniques;Passive range of motion    PT Next Visit Plan check BP/watch for any dizziness. how is initial HEP? gentle stretching/weight shifting at counter or try standing lumbar extension over counter, core stabilization, any other gentle movement as pt's whole body is very guarded, NuStep, if supine put pt on wedge or with incr pillows - pt can't tolerate any prone position    Consulted and Agree with Plan of Care Patient           Patient will benefit from skilled therapeutic intervention in order to improve the following deficits and impairments:  Abnormal gait, Decreased activity tolerance, Decreased range of motion, Decreased mobility, Decreased strength, Difficulty walking, Hypomobility, Impaired flexibility, Impaired sensation, Increased muscle spasms, Postural dysfunction, Improper body mechanics, Pain  Visit Diagnosis: Other abnormalities of gait and mobility  Abnormal posture  Acute midline low back pain, unspecified whether sciatica present  Neck pain  Radiculopathy, cervical region  Upper back pain     Problem List Patient Active Problem List   Diagnosis Date Noted  . Emesis   . Headache   . Acute recurrent sinusitis   . SIRS (systemic inflammatory response syndrome) (HCC) 11/27/2019  . Acute back pain 11/27/2019  . Fever 11/27/2019  . DM (diabetes mellitus), type 2 (HCC) 11/27/2019  . History of frequent upper respiratory infection 10/07/2019  . Other allergic rhinitis 10/07/2019  . Urticaria 10/07/2019  . Not well controlled severe persistent asthma 10/06/2019    Ileana Ladd, PT 01/12/2020, 4:07 PM  [facility] [address] [place], [place], [address] Phone: [phone]   Fax:  [phone]  Name: Zyon Rosser MRN: [id number] Date of Birth: July 26, 1960

## 2020-01-14 ENCOUNTER — Ambulatory Visit (INDEPENDENT_AMBULATORY_CARE_PROVIDER_SITE_OTHER): Payer: BC Managed Care – PPO

## 2020-01-14 ENCOUNTER — Other Ambulatory Visit: Payer: Self-pay

## 2020-01-14 DIAGNOSIS — J4550 Severe persistent asthma, uncomplicated: Secondary | ICD-10-CM

## 2020-01-15 ENCOUNTER — Ambulatory Visit: Payer: BC Managed Care – PPO

## 2020-01-15 DIAGNOSIS — R2689 Other abnormalities of gait and mobility: Secondary | ICD-10-CM | POA: Diagnosis not present

## 2020-01-15 DIAGNOSIS — M542 Cervicalgia: Secondary | ICD-10-CM

## 2020-01-15 DIAGNOSIS — M5412 Radiculopathy, cervical region: Secondary | ICD-10-CM

## 2020-01-15 DIAGNOSIS — M549 Dorsalgia, unspecified: Secondary | ICD-10-CM

## 2020-01-15 DIAGNOSIS — M545 Low back pain, unspecified: Secondary | ICD-10-CM

## 2020-01-15 DIAGNOSIS — R293 Abnormal posture: Secondary | ICD-10-CM

## 2020-01-15 NOTE — Therapy (Signed)
[facility] [address] [place], [place], [address] Phone: [phone]   Fax:  [phone]  Physical Therapy Treatment  Patient Details  Name: Bryan Lopez MRN: [id number] Date of Birth: 03-08-1961 Referring Provider (PT): Rodolph Bong, MD   Encounter Date: 01/15/2020   PT End of Session - 01/15/20 1604    Visit Number 10    Number of Visits 13    Date for PT Re-Evaluation 02/06/20    Authorization Type [employer]; recert 12/15/19 with added cervical diagnosis, PN 12/24/19    PT Start Time 1530    PT Stop Time 1615    PT Time Calculation (min) 45 min           Past Medical History:  Diagnosis Date  . Crohn disease (HCC)   . Frequent sinus infections   . GERD (gastroesophageal reflux disease)   . History of ear infections   . Hx of bronchitis   . Hyperlipidemia   . Hypertension   . Urticaria     Past Surgical History:  Procedure Laterality Date  . Cardiac Stents    . Sinus Surgeries      There were no vitals filed for this visit.   Subjective Assessment - 01/15/20 1602    Subjective It has been rough week. I have been very busy and my back has been sore because of that.    Pertinent History asthma, frequent respiratory infections, HLD, HTN    Limitations Lifting;Standing;Walking;Sitting    How long can you stand comfortably? approx. 15-20 minutes    How long can you walk comfortably? can walk - it just hurts, can walk smaller household distances    Diagnostic tests MRI cervical, lumbar and thoracic spine 11/27/19: No significant abnormality identified. Mild degenerative changes are present.    Patient Stated Goals wants to be able to bend and move and get back to work    Currently in Pain? Yes    Pain Score 6     Pain Location Back    Pain Onset More than a month ago               Supine manually stretched bil hip extensors and piriformis Prone grade IV unilateral mobilization over L5 on  L Supine bil knee to chest with 2.5lb ball: 2 x 10 Supine ab bracing with marching: 20x R and  Seated manually resisted hip internal and external rotations: 20x  Rand L                       PT Short Term Goals - 12/24/19 1628      PT SHORT TERM GOAL #1   Title Pt will undergo further assessment of ODI with LTG to be written as appropriate. ALL STGS DUE 12/29/19    Baseline performed on 12/11/19: 55% = severe disability    Time 3    Period Weeks    Status Achieved    Target Date 12/29/19      PT SHORT TERM GOAL #2   Title Pt will report low back pain to a 5/10 or less in order to improve functional mobility.    Baseline 7/10 pain in bakc and neck (12/24/19)    Time 3    Period Weeks    Status On-going      PT SHORT TERM GOAL #3   Title Pt will demo proper body mechanics to perform a functional squat in order to improve functional mobility  and decr low back pain.    Baseline pt's movements are very guarded    Time 3    Period Weeks    Status On-going      PT SHORT TERM GOAL #4   Title Pt will demo 50 deg of cervical rotation bil with pain <4/10 to improve ability to turn head while driving    Baseline 40 deg bil cervical rotation with pain of 8/10    Time 4    Period Weeks    Status New    Target Date 01/12/20      PT SHORT TERM GOAL #5   Title Patient will report 50% improvement in intermittent radicular symptoms in R UE with functional activities    Time 4    Period Weeks    Status On-going    Target Date 01/12/20             PT Long Term Goals - 12/24/19 1629      PT LONG TERM GOAL #1   Title Pt will decr ODI score to at least 45% in order to demo decr disability. ALL LTGS DUE 01/19/20    Baseline 55% on 12/11/19    Time 6    Period Weeks    Status New      PT LONG TERM GOAL #2   Title Pt will report 3/10 or less low back pain in order to improve functional mobility and return to work.    Time 6    Period Weeks    Status New      PT LONG  TERM GOAL #3   Title Patient will demo 20 points improvement on FABQ to improve overall function and help to return back to work    Baseline 75/96 (12/24/19)    Time 6    Period Weeks    Status New      PT LONG TERM GOAL #4   Title Pt will report being able to perform standing/walking activities for at least 30 minutes in order to improve activity tolerance for work.    Time 6    Period Weeks    Status New      PT LONG TERM GOAL #5   Title Patient will be able to turn head to 60 deg bil with <3/10 pain to improve ability to turn head while driving    Baseline 40 deg with 8/10 pain bil cervical rotation    Time 8    Period Weeks    Status New      PT LONG TERM GOAL #6   Title Patient will be able to carry 40lbs for about 20 lbs to be able to return back to work with modified duty    Baseline not working due to injury    Time 8    Period Weeks    Status New                 Plan - 01/15/20 1602    Clinical Impression Statement Pt reported pain level decreased to 3/10 from 6/10 at end of the session. TOlerated progression of core stabilization well.    Personal Factors and Comorbidities Comorbidity 3+;Time since onset of injury/illness/exacerbation;Past/Current Experience    Comorbidities HLD, HTN, diabetes, hx of sinusitis    Examination-Activity Limitations Bed Mobility;Bend;Locomotion Level;Caring for Others;Stand;Stairs;Squat;Transfers;Sit    Examination-Participation Restrictions Community Activity   work   Stability/Clinical Decision Making Evolving/Moderate complexity    Rehab Potential Good    PT Frequency 2x /  week    PT Duration 8 weeks    PT Treatment/Interventions ADLs/Self Care Home Management;Gait training;Stair training;Functional mobility training;Therapeutic activities;Therapeutic exercise;Patient/family education;Manual techniques;Passive range of motion    PT Next Visit Plan check BP/watch for any dizziness. how is initial HEP? gentle stretching/weight  shifting at counter or try standing lumbar extension over counter, core stabilization, any other gentle movement as pt's whole body is very guarded, NuStep, if supine put pt on wedge or with incr pillows - pt can't tolerate any prone position    Consulted and Agree with Plan of Care Patient           Patient will benefit from skilled therapeutic intervention in order to improve the following deficits and impairments:  Abnormal gait, Decreased activity tolerance, Decreased range of motion, Decreased mobility, Decreased strength, Difficulty walking, Hypomobility, Impaired flexibility, Impaired sensation, Increased muscle spasms, Postural dysfunction, Improper body mechanics, Pain  Visit Diagnosis: Other abnormalities of gait and mobility  Abnormal posture  Acute midline low back pain, unspecified whether sciatica present  Neck pain  Radiculopathy, cervical region  Upper back pain     Problem List Patient Active Problem List   Diagnosis Date Noted  . Emesis   . Headache   . Acute recurrent sinusitis   . SIRS (systemic inflammatory response syndrome) (HCC) 11/27/2019  . Acute back pain 11/27/2019  . Fever 11/27/2019  . DM (diabetes mellitus), type 2 (HCC) 11/27/2019  . History of frequent upper respiratory infection 10/07/2019  . Other allergic rhinitis 10/07/2019  . Urticaria 10/07/2019  . Not well controlled severe persistent asthma 10/06/2019    Ileana Ladd, PT 01/15/2020, 4:05 PM  [facility] [address] [place], [place], [address] Phone: [phone]   Fax:  [phone]  Name: Bryan Lopez MRN: [id number] Date of Birth: 1960/05/23

## 2020-01-19 ENCOUNTER — Ambulatory Visit: Payer: BC Managed Care – PPO

## 2020-01-19 ENCOUNTER — Other Ambulatory Visit: Payer: Self-pay

## 2020-01-19 DIAGNOSIS — M542 Cervicalgia: Secondary | ICD-10-CM

## 2020-01-19 DIAGNOSIS — M5412 Radiculopathy, cervical region: Secondary | ICD-10-CM

## 2020-01-19 DIAGNOSIS — M549 Dorsalgia, unspecified: Secondary | ICD-10-CM

## 2020-01-19 DIAGNOSIS — R2689 Other abnormalities of gait and mobility: Secondary | ICD-10-CM

## 2020-01-19 DIAGNOSIS — R293 Abnormal posture: Secondary | ICD-10-CM

## 2020-01-19 DIAGNOSIS — M545 Low back pain, unspecified: Secondary | ICD-10-CM

## 2020-01-19 NOTE — Therapy (Signed)
[facility] [address] [place], [place], [address] Phone: [phone]   Fax:  [phone]  Physical Therapy Treatment  Patient Details  Name: Bryan Lopez MRN: [id number] Date of Birth: 16-Dec-1960 Referring Provider (PT): Rodolph Bong, MD   Encounter Date: 01/19/2020   PT End of Session - 01/19/20 1905    Visit Number 11    Number of Visits 13    Date for PT Re-Evaluation 02/06/20    Authorization Type [employer]BS; recert 12/15/19 with added cervical diagnosis, PN 12/24/19    PT Start Time 1535    PT Stop Time 1615    PT Time Calculation (min) 40 min    Equipment Utilized During Treatment Gait belt    Activity Tolerance Patient limited by pain    Behavior During Therapy [facility] for tasks assessed/performed           Past Medical History:  Diagnosis Date  . Crohn disease (HCC)   . Frequent sinus infections   . GERD (gastroesophageal reflux disease)   . History of ear infections   . Hx of bronchitis   . Hyperlipidemia   . Hypertension   . Urticaria     Past Surgical History:  Procedure Laterality Date  . Cardiac Stents    . Sinus Surgeries      There were no vitals filed for this visit.   Subjective Assessment - 01/19/20 1903    Subjective Pt reports he is feeling sciatica to his gluts bil. Pain is 6-7/10    Pertinent History asthma, frequent respiratory infections, HLD, HTN    Limitations Lifting;Standing;Walking;Sitting    How long can you stand comfortably? approx. 15-20 minutes    How long can you walk comfortably? can walk - it just hurts, can walk smaller household distances    Diagnostic tests MRI cervical, lumbar and thoracic spine 11/27/19: No significant abnormality identified. Mild degenerative changes are present.    Patient Stated Goals wants to be able to bend and move and get back to work    Currently in Pain? Yes    Pain Score 7     Pain Location Back    Pain Orientation Right;Left    Pain  Descriptors / Indicators Aching;Radiating    Pain Type Acute pain    Pain Onset More than a month ago    Pain Frequency Constant                Supine hooklying: practiced anterior pelvis tilts without lifting tailbone off the bd: 10x Pt asked to maintain anterior pelvis tilt with following exercise to improve stretching: - knee to chest: 10 x 10" holds - figure 4 stretch: 10 x 10" holds STM to bil levator scapulae and upper traps in seated position Grade IV 1st rib mobilization on L                        PT Short Term Goals - 12/24/19 1628      PT SHORT TERM GOAL #1   Title Pt will undergo further assessment of ODI with LTG to be written as appropriate. ALL STGS DUE 12/29/19    Baseline performed on 12/11/19: 55% = severe disability    Time 3    Period Weeks    Status Achieved    Target Date 12/29/19      PT SHORT TERM GOAL #2   Title Pt will report low back pain to a 5/10 or less  in order to improve functional mobility.    Baseline 7/10 pain in bakc and neck (12/24/19)    Time 3    Period Weeks    Status On-going      PT SHORT TERM GOAL #3   Title Pt will demo proper body mechanics to perform a functional squat in order to improve functional mobility and decr low back pain.    Baseline pt's movements are very guarded    Time 3    Period Weeks    Status On-going      PT SHORT TERM GOAL #4   Title Pt will demo 50 deg of cervical rotation bil with pain <4/10 to improve ability to turn head while driving    Baseline 40 deg bil cervical rotation with pain of 8/10    Time 4    Period Weeks    Status New    Target Date 01/12/20      PT SHORT TERM GOAL #5   Title Patient will report 50% improvement in intermittent radicular symptoms in R UE with functional activities    Time 4    Period Weeks    Status On-going    Target Date 01/12/20             PT Long Term Goals - 12/24/19 1629      PT LONG TERM GOAL #1   Title Pt will decr ODI score  to at least 45% in order to demo decr disability. ALL LTGS DUE 01/19/20    Baseline 55% on 12/11/19    Time 6    Period Weeks    Status New      PT LONG TERM GOAL #2   Title Pt will report 3/10 or less low back pain in order to improve functional mobility and return to work.    Time 6    Period Weeks    Status New      PT LONG TERM GOAL #3   Title Patient will demo 20 points improvement on FABQ to improve overall function and help to return back to work    Baseline 75/96 (12/24/19)    Time 6    Period Weeks    Status New      PT LONG TERM GOAL #4   Title Pt will report being able to perform standing/walking activities for at least 30 minutes in order to improve activity tolerance for work.    Time 6    Period Weeks    Status New      PT LONG TERM GOAL #5   Title Patient will be able to turn head to 60 deg bil with <3/10 pain to improve ability to turn head while driving    Baseline 40 deg with 8/10 pain bil cervical rotation    Time 8    Period Weeks    Status New      PT LONG TERM GOAL #6   Title Patient will be able to carry 40lbs for about 20 lbs to be able to return back to work with modified duty    Baseline not working due to injury    Time 8    Period Weeks    Status New                 Plan - 01/19/20 1905    Clinical Impression Statement Pt reported significant improvement with exercises today as he reported his pain decreased from 7/10 to 3/10 at end of the session.  Pt was given updated HEP to work on flexibility at home.    Personal Factors and Comorbidities Comorbidity 3+;Time since onset of injury/illness/exacerbation;Past/Current Experience    Comorbidities HLD, HTN, diabetes, hx of sinusitis    Examination-Activity Limitations Bed Mobility;Bend;Locomotion Level;Caring for Others;Stand;Stairs;Squat;Transfers;Sit    Examination-Participation Restrictions Community Activity   work   Stability/Clinical Decision Making Evolving/Moderate complexity    Rehab  Potential Good    PT Frequency 2x / week    PT Duration 8 weeks    PT Treatment/Interventions ADLs/Self Care Home Management;Gait training;Stair training;Functional mobility training;Therapeutic activities;Therapeutic exercise;Patient/family education;Manual techniques;Passive range of motion    PT Next Visit Plan check BP/watch for any dizziness. how is initial HEP? gentle stretching/weight shifting at counter or try standing lumbar extension over counter, core stabilization, any other gentle movement as pt's whole body is very guarded, NuStep, if supine put pt on wedge or with incr pillows - pt can't tolerate any prone position    Consulted and Agree with Plan of Care Patient           Patient will benefit from skilled therapeutic intervention in order to improve the following deficits and impairments:  Abnormal gait, Decreased activity tolerance, Decreased range of motion, Decreased mobility, Decreased strength, Difficulty walking, Hypomobility, Impaired flexibility, Impaired sensation, Increased muscle spasms, Postural dysfunction, Improper body mechanics, Pain  Visit Diagnosis: Other abnormalities of gait and mobility  Abnormal posture  Acute midline low back pain, unspecified whether sciatica present  Neck pain  Radiculopathy, cervical region  Upper back pain     Problem List Patient Active Problem List   Diagnosis Date Noted  . Emesis   . Headache   . Acute recurrent sinusitis   . SIRS (systemic inflammatory response syndrome) (HCC) 11/27/2019  . Acute back pain 11/27/2019  . Fever 11/27/2019  . DM (diabetes mellitus), type 2 (HCC) 11/27/2019  . History of frequent upper respiratory infection 10/07/2019  . Other allergic rhinitis 10/07/2019  . Urticaria 10/07/2019  . Not well controlled severe persistent asthma 10/06/2019    Ileana Ladd 01/19/2020, 7:08 PM  [facility] [address] [place], [place], [address] Phone: [phone]   Fax:  [phone]  Name: Bryan Lopez MRN: [id number] Date of Birth: 10/06/1960

## 2020-01-19 NOTE — Patient Instructions (Signed)
Access Code: [email or web address] URL: [email or web address] Date: 01/19/2020 Prepared by: Lavone Nian  Exercises Hooklying Single Knee to Chest Stretch - 2 x daily - 7 x weekly - 10 reps - 10 hold Supine Hip External Rotation Stretch - 2 x daily - 7 x weekly - 10 reps - 10 hold

## 2020-01-22 ENCOUNTER — Other Ambulatory Visit: Payer: Self-pay

## 2020-01-22 ENCOUNTER — Ambulatory Visit: Payer: BC Managed Care – PPO | Attending: Internal Medicine

## 2020-01-22 DIAGNOSIS — M545 Low back pain, unspecified: Secondary | ICD-10-CM | POA: Insufficient documentation

## 2020-01-22 DIAGNOSIS — M549 Dorsalgia, unspecified: Secondary | ICD-10-CM | POA: Insufficient documentation

## 2020-01-22 DIAGNOSIS — R2689 Other abnormalities of gait and mobility: Secondary | ICD-10-CM | POA: Diagnosis not present

## 2020-01-22 DIAGNOSIS — M5412 Radiculopathy, cervical region: Secondary | ICD-10-CM | POA: Insufficient documentation

## 2020-01-22 DIAGNOSIS — M542 Cervicalgia: Secondary | ICD-10-CM | POA: Insufficient documentation

## 2020-01-22 DIAGNOSIS — R293 Abnormal posture: Secondary | ICD-10-CM | POA: Insufficient documentation

## 2020-01-22 NOTE — Patient Instructions (Signed)
Access Code: [email or web address] URL: [email or web address] Date: 01/22/2020 Prepared by: Lavone Nian  Exercises Hooklying Single Knee to Chest Stretch - 2 x daily - 7 x weekly - 10 reps - 10 hold Supine Hip External Rotation Stretch - 2 x daily - 7 x weekly - 10 reps - 10 hold Prone Press Up - 2 x daily - 7 x weekly - 10 reps

## 2020-01-22 NOTE — Therapy (Signed)
[facility] [address] [place], [place], [address] Phone: [phone]   Fax:  [phone]  Physical Therapy Treatment  Patient Details  Name: Bryan Lopez MRN: [id number] Date of Birth: 1960-12-20 Referring Provider (PT): Rodolph Bong, MD   Encounter Date: 01/22/2020   PT End of Session - 01/22/20 1453    Visit Number 12    Number of Visits 13    Date for PT Re-Evaluation 02/06/20    Authorization Type BCBS; recert 12/15/19 with added cervical diagnosis, PN 12/24/19    PT Start Time 1400    PT Stop Time 1445    PT Time Calculation (min) 45 min    Equipment Utilized During Treatment Gait belt    Activity Tolerance Patient limited by pain    Behavior During Therapy [facility] for tasks assessed/performed           Past Medical History:  Diagnosis Date  . Crohn disease (HCC)   . Frequent sinus infections   . GERD (gastroesophageal reflux disease)   . History of ear infections   . Hx of bronchitis   . Hyperlipidemia   . Hypertension   . Urticaria     Past Surgical History:  Procedure Laterality Date  . Cardiac Stents    . Sinus Surgeries      There were no vitals filed for this visit.   Subjective Assessment - 01/22/20 1423    Subjective Pt reports Left side of the back is really hurting and going down to L butt cheek.    Pertinent History asthma, frequent respiratory infections, HLD, HTN    Limitations Lifting;Standing;Walking;Sitting    How long can you stand comfortably? approx. 15-20 minutes    How long can you walk comfortably? can walk - it just hurts, can walk smaller household distances    Diagnostic tests MRI cervical, lumbar and thoracic spine 11/27/19: No significant abnormality identified. Mild degenerative changes are present.    Patient Stated Goals wants to be able to bend and move and get back to work    Currently in Pain? Yes    Pain Score 6     Pain Location Back    Pain Orientation Left     Pain Onset More than a month ago               Grade IV central and unilateral PA mobilization at L5 STM to multifidus on left side  TherEx: Prone press ups: 2 x 5 Supine figure 4 stretch with anterior pelvis tilts: 10 x 10" holds  Deadlift: 25lbs 5x, 10lbs 10x, cues to keep back straight, engage lumbar musculature, hinge from hip                         PT Short Term Goals - 12/24/19 1628      PT SHORT TERM GOAL #1   Title Pt will undergo further assessment of ODI with LTG to be written as appropriate. ALL STGS DUE 12/29/19    Baseline performed on 12/11/19: 55% = severe disability    Time 3    Period Weeks    Status Achieved    Target Date 12/29/19      PT SHORT TERM GOAL #2   Title Pt will report low back pain to a 5/10 or less in order to improve functional mobility.    Baseline 7/10 pain in bakc and neck (12/24/19)    Time 3  Period Weeks    Status On-going      PT SHORT TERM GOAL #3   Title Pt will demo proper body mechanics to perform a functional squat in order to improve functional mobility and decr low back pain.    Baseline pt's movements are very guarded    Time 3    Period Weeks    Status On-going      PT SHORT TERM GOAL #4   Title Pt will demo 50 deg of cervical rotation bil with pain <4/10 to improve ability to turn head while driving    Baseline 40 deg bil cervical rotation with pain of 8/10    Time 4    Period Weeks    Status New    Target Date 01/12/20      PT SHORT TERM GOAL #5   Title Patient will report 50% improvement in intermittent radicular symptoms in R UE with functional activities    Time 4    Period Weeks    Status On-going    Target Date 01/12/20             PT Long Term Goals - 12/24/19 1629      PT LONG TERM GOAL #1   Title Pt will decr ODI score to at least 45% in order to demo decr disability. ALL LTGS DUE 01/19/20    Baseline 55% on 12/11/19    Time 6    Period Weeks    Status New      PT  LONG TERM GOAL #2   Title Pt will report 3/10 or less low back pain in order to improve functional mobility and return to work.    Time 6    Period Weeks    Status New      PT LONG TERM GOAL #3   Title Patient will demo 20 points improvement on FABQ to improve overall function and help to return back to work    Baseline 75/96 (12/24/19)    Time 6    Period Weeks    Status New      PT LONG TERM GOAL #4   Title Pt will report being able to perform standing/walking activities for at least 30 minutes in order to improve activity tolerance for work.    Time 6    Period Weeks    Status New      PT LONG TERM GOAL #5   Title Patient will be able to turn head to 60 deg bil with <3/10 pain to improve ability to turn head while driving    Baseline 40 deg with 8/10 pain bil cervical rotation    Time 8    Period Weeks    Status New      PT LONG TERM GOAL #6   Title Patient will be able to carry 40lbs for about 20 lbs to be able to return back to work with modified duty    Baseline not working due to injury    Time 8    Period Weeks    Status New                 Plan - 01/22/20 1423    Clinical Impression Statement Pt reported no radicular symptoms in left side of back after manual therapy. Pt tolerated exercise well but required cueing to properly perform the exercises with proper form. Pt needs cueing for proper hinging from hips.    Personal Factors and Comorbidities Comorbidity 3+;Time since onset  of injury/illness/exacerbation;Past/Current Experience    Comorbidities HLD, HTN, diabetes, hx of sinusitis    Examination-Activity Limitations Bed Mobility;Bend;Locomotion Level;Caring for Others;Stand;Stairs;Squat;Transfers;Sit    Examination-Participation Restrictions Community Activity    Stability/Clinical Decision Making Evolving/Moderate complexity    Rehab Potential Good    PT Frequency 2x / week    PT Duration 8 weeks    PT Treatment/Interventions ADLs/Self Care Home  Management;Gait training;Stair training;Functional mobility training;Therapeutic activities;Therapeutic exercise;Patient/family education;Manual techniques;Passive range of motion    PT Next Visit Plan check BP/watch for any dizziness. how is initial HEP? gentle stretching/weight shifting at counter or try standing lumbar extension over counter, core stabilization, any other gentle movement as pt's whole body is very guarded, NuStep, if supine put pt on wedge or with incr pillows - pt can't tolerate any prone position           Patient will benefit from skilled therapeutic intervention in order to improve the following deficits and impairments:  Abnormal gait, Decreased activity tolerance, Decreased range of motion, Decreased mobility, Decreased strength, Difficulty walking, Hypomobility, Impaired flexibility, Impaired sensation, Increased muscle spasms, Postural dysfunction, Improper body mechanics, Pain  Visit Diagnosis: Other abnormalities of gait and mobility  Acute midline low back pain, unspecified whether sciatica present  Neck pain  Radiculopathy, cervical region  Upper back pain  Abnormal posture     Problem List Patient Active Problem List   Diagnosis Date Noted  . Emesis   . Headache   . Acute recurrent sinusitis   . SIRS (systemic inflammatory response syndrome) (HCC) 11/27/2019  . Acute back pain 11/27/2019  . Fever 11/27/2019  . DM (diabetes mellitus), type 2 (HCC) 11/27/2019  . History of frequent upper respiratory infection 10/07/2019  . Other allergic rhinitis 10/07/2019  . Urticaria 10/07/2019  . Not well controlled severe persistent asthma 10/06/2019    Ileana Ladd, PT 01/22/2020, 2:54 PM  [facility] [address] [place], [place], [address] Phone: [phone]   Fax:  [phone]  Name: Bryan Lopez MRN: [id number] Date of Birth: February 15, 1961

## 2020-01-25 ENCOUNTER — Telehealth: Payer: Self-pay | Admitting: Allergy

## 2020-01-25 MED ORDER — BREZTRI AEROSPHERE 160-9-4.8 MCG/ACT IN AERO: 2.0000 | INHALATION_SPRAY | Freq: Two times a day (BID) | RESPIRATORY_TRACT | 5 refills | Status: DC

## 2020-01-25 NOTE — Telephone Encounter (Signed)
Will check [facility] tomorrow to see if patient has another Dupixent, then will call him in regards to the inhaler.

## 2020-01-25 NOTE — Telephone Encounter (Signed)
Spoke with patient to see if the pharmacy gave him any alternatives for trelegy he stated they did not. I called pharmacy, they stated that they wouldn't know until they ran the medication with his insurance. Patient also informed me that he is having an issue with insurance not wanting to cover his Dupixent. I gave him Tammy's number, informed him to contact her and see is she would be able to help with the dupixent. Please advise for alternative for trelegy.

## 2020-01-25 NOTE — Telephone Encounter (Signed)
Patient's insurance does not cover Trelegy anymore, it is $690 a month. Patient would like an alternative called in to [facility] in [place].   Please advise.

## 2020-01-25 NOTE — Telephone Encounter (Signed)
I'm sending in Henrietta 2 puffs twice a day. Have him pick up the coupon when he comes in next time for his Dupixet injection and give him a sample of Breztri as well.  If this is not covered, will have to split the inhaler into 2 separate inhalers.   Thank you.

## 2020-01-26 ENCOUNTER — Other Ambulatory Visit: Payer: Self-pay

## 2020-01-26 ENCOUNTER — Ambulatory Visit: Payer: BC Managed Care – PPO

## 2020-01-26 DIAGNOSIS — M545 Low back pain, unspecified: Secondary | ICD-10-CM

## 2020-01-26 DIAGNOSIS — R2689 Other abnormalities of gait and mobility: Secondary | ICD-10-CM

## 2020-01-26 DIAGNOSIS — M549 Dorsalgia, unspecified: Secondary | ICD-10-CM

## 2020-01-26 DIAGNOSIS — M5412 Radiculopathy, cervical region: Secondary | ICD-10-CM

## 2020-01-26 DIAGNOSIS — R293 Abnormal posture: Secondary | ICD-10-CM

## 2020-01-26 DIAGNOSIS — M542 Cervicalgia: Secondary | ICD-10-CM

## 2020-01-26 NOTE — Therapy (Signed)
[facility] [address] [place], [place], [address] Phone: [phone]   Fax:  [phone]  Physical Therapy Therapy Re-certification  Patient Details  Name: Bryan Lopez MRN: [id number] Date of Birth: 25-Jun-1960 Referring Provider (PT): Rodolph Bong, MD   Encounter Date: 01/26/2020    Past Medical History:  Diagnosis Date  . Crohn disease (HCC)   . Frequent sinus infections   . GERD (gastroesophageal reflux disease)   . History of ear infections   . Hx of bronchitis   . Hyperlipidemia   . Hypertension   . Urticaria     Past Surgical History:  Procedure Laterality Date  . Cardiac Stents    . Sinus Surgeries      There were no vitals filed for this visit.       [facility] PT Assessment - 01/29/20 0001      Observation/Other Assessments   Oswestry Disability Index  48%    Fear Avoidance Belief Questionnaire (FABQ)  52/96          Supine ab bracing with marching: 20x Supine lower trunk rotations: 30x Seated prayer stretch: fwd: 20x, lateral to each side: 20x Seated bil row: 30lbs 10x Seated uni rowl: 20llbs 10x R and L Seated lat pull downs: 30 lbs 20x                          PT Short Term Goals - 01/26/20 1558      PT SHORT TERM GOAL #1   Title Pt will undergo further assessment of ODI with LTG to be written as appropriate. ALL STGS DUE 12/29/19    Baseline performed on 12/11/19: 55% = severe disability; 48% on 01/27/20    Time 3    Period Weeks    Status Achieved    Target Date 12/29/19      PT SHORT TERM GOAL #2   Title Pt will report low back pain to a 5/10 or less in order to improve functional mobility.    Baseline 7/10 pain in bakc and neck (12/24/19); 6/10 01/26/20    Time 3    Period Weeks    Status On-going      PT SHORT TERM GOAL #3   Title Pt will demo proper body mechanics to perform a functional squat in order to improve functional mobility and decr low  back pain.    Baseline pt's movements are very guarded    Time 3    Period Weeks    Status On-going      PT SHORT TERM GOAL #4   Title Pt will demo 50 deg of cervical rotation bil with pain <4/10 to improve ability to turn head while driving    Baseline 40 deg bil cervical rotation with pain of 8/10    Time 4    Period Weeks    Status Achieved    Target Date 01/12/20      PT SHORT TERM GOAL #5   Title Patient will report 50% improvement in intermittent radicular symptoms in R UE with functional activities    Time 4    Period Weeks    Status On-going    Target Date 01/12/20             PT Long Term Goals - 01/26/20 1600      PT LONG TERM GOAL #1   Title Pt will decr ODI score to at least 45% in order to demo  decr disability. ALL LTGS DUE 01/19/20    Baseline 55% on 12/11/19, 48% on 01/29/20    Time 6    Period Weeks    Status On-going    Target Date 02/26/20      PT LONG TERM GOAL #2   Title Pt will report 3/10 or less low back pain in order to improve functional mobility and return to work.    Baseline 6/10 on 01/26/20    Time 6    Period Weeks    Status Revised    Target Date 02/26/20      PT LONG TERM GOAL #3   Title Patient will demo 20 points improvement on FABQ to improve overall function and help to return back to work    Baseline 75/96 (12/24/19); 58/96 (01/26/20)    Time 6    Period Weeks    Status New      PT LONG TERM GOAL #4   Title Pt will report being able to perform standing/walking activities for at least 30 minutes in order to improve activity tolerance for work.    Baseline able to perform work for 8 hours with 6-7/10 pain in back.    Time 6    Period Weeks    Status Achieved      PT LONG TERM GOAL #5   Title Patient will be able to turn head to 60 deg bil with <3/10 pain to improve ability to turn head while driving    Baseline 65 deg bil 01/05/20    Time 8    Period Weeks    Status Achieved      PT LONG TERM GOAL #6   Title Patient will be  able to carry 40lbs for about 20 lbs to be able to return back to work with modified duty    Baseline able to carry 20 lbs    Time 8    Period Weeks    Status On-going                  Patient will benefit from skilled therapeutic intervention in order to improve the following deficits and impairments:  Abnormal gait, Decreased activity tolerance, Decreased range of motion, Decreased mobility, Decreased strength, Difficulty walking, Hypomobility, Impaired flexibility, Impaired sensation, Increased muscle spasms, Postural dysfunction, Improper body mechanics, Pain  Visit Diagnosis: Other abnormalities of gait and mobility  Acute midline low back pain, unspecified whether sciatica present  Neck pain  Radiculopathy, cervical region  Upper back pain  Abnormal posture     Problem List Patient Active Problem List   Diagnosis Date Noted  . Emesis   . Headache   . Acute recurrent sinusitis   . SIRS (systemic inflammatory response syndrome) (HCC) 11/27/2019  . Acute back pain 11/27/2019  . Fever 11/27/2019  . DM (diabetes mellitus), type 2 (HCC) 11/27/2019  . History of frequent upper respiratory infection 10/07/2019  . Other allergic rhinitis 10/07/2019  . Urticaria 10/07/2019  . Not well controlled severe persistent asthma 10/06/2019    Ileana Ladd, PT 01/29/2020, 3:48 PM  [facility] [address] [place], [place], [address] Phone: [phone]   Fax:  [phone]  Name: Bryan Lopez MRN: [id number] Date of Birth: 04-02-1961

## 2020-01-26 NOTE — Telephone Encounter (Signed)
Spoke with patient, informed him of Dr. Elmyra Ricks recommendation. Patient verbalized understanding and would pick up sample and copay card.

## 2020-01-28 ENCOUNTER — Ambulatory Visit: Payer: Self-pay

## 2020-01-28 ENCOUNTER — Ambulatory Visit: Payer: BC Managed Care – PPO | Admitting: Physical Therapy

## 2020-01-29 ENCOUNTER — Ambulatory Visit: Payer: BC Managed Care – PPO

## 2020-02-02 ENCOUNTER — Other Ambulatory Visit: Payer: Self-pay

## 2020-02-02 ENCOUNTER — Ambulatory Visit: Payer: BC Managed Care – PPO | Admitting: Physical Therapy

## 2020-02-02 DIAGNOSIS — M545 Low back pain, unspecified: Secondary | ICD-10-CM

## 2020-02-02 DIAGNOSIS — R293 Abnormal posture: Secondary | ICD-10-CM

## 2020-02-02 DIAGNOSIS — R2689 Other abnormalities of gait and mobility: Secondary | ICD-10-CM

## 2020-02-02 DIAGNOSIS — M5412 Radiculopathy, cervical region: Secondary | ICD-10-CM

## 2020-02-02 NOTE — Therapy (Signed)
[facility] [address] [place], [place], [address] Phone: [phone]   Fax:  [phone]  Physical Therapy Treatment  Patient Details  Name: Bryan Lopez MRN: [id number] Date of Birth: [birth year] Referring Provider (PT): Rodolph Bong, MD   Encounter Date: 02/02/2020   PT End of Session - 02/02/20 1325    Visit Number 14    Number of Visits 21    Date for PT Re-Evaluation 03/11/20    Authorization Type BCBS; recert 12/15/19 with added cervical diagnosis, PN 12/24/19, Recert 01/29/20 (8 sessions, 2x/week)    PT Start Time 1322    PT Stop Time 1405    PT Time Calculation (min) 43 min    Equipment Utilized During Treatment Gait belt    Activity Tolerance Patient limited by pain    Behavior During Therapy WFL for tasks assessed/performed           Past Medical History:  Diagnosis Date   Crohn disease (HCC)    Frequent sinus infections    GERD (gastroesophageal reflux disease)    History of ear infections    Hx of bronchitis    Hyperlipidemia    Hypertension    Urticaria     Past Surgical History:  Procedure Laterality Date   Cardiac Stents     Sinus Surgeries      There were no vitals filed for this visit.   Subjective Assessment - 02/02/20 1325    Subjective Pt reports he is to get nerve block sometime this month. Pt reports ordering wedges from [employer] and it has been making his exercises better. Compliant performing HEP.    Pertinent History asthma, frequent respiratory infections, HLD, HTN    Limitations Lifting;Standing;Walking;Sitting    How long can you stand comfortably? approx. 15-20 minutes    How long can you walk comfortably? can walk - it just hurts, can walk smaller household distances    Diagnostic tests MRI cervical, lumbar and thoracic spine 11/27/19: No significant abnormality identified. Mild degenerative changes are present.    Patient Stated Goals wants to be able to bend  and move and get back to work    Currently in Pain? Yes    Pain Score 7     Pain Location Back    Pain Orientation Left    Pain Descriptors / Indicators Aching;Radiating    Pain Type Acute pain    Pain Onset More than a month ago                             [facility] Adult PT Treatment/Exercise - 02/02/20 0001      Lumbar Exercises: Aerobic   Nustep L4 x 5 min LE & UEs            Seated on airex:  PPT x10  Ab set 2x10 pushing straight down on small pball  Ab set 2x10 pushing diagonally down on small pball  Seated hip hinge with core bracing x10  Standing:   Mini squat with butt tap on seat 2x10 with increased symptoms  Hip circles x10 for stretch  L stretch 2x10 sec into lumbar extension 2x10 sec against counter  Lateral shift R & L x10 bilat with improved symptoms  Scapular retraction 10 x 2 sec  Chin tuck 10 x 2 sec       PT Education - 02/02/20 1416    Education Details Discussed symptom management using McKenzie techniques  Person(s) Educated Patient    Methods Explanation    Comprehension Verbalized understanding            PT Short Term Goals - 01/26/20 1558      PT SHORT TERM GOAL #1   Title Pt will undergo further assessment of ODI with LTG to be written as appropriate. ALL STGS DUE 12/29/19    Baseline performed on 12/11/19: 55% = severe disability; 48% on 01/27/20    Time 3    Period Weeks    Status Achieved    Target Date 12/29/19      PT SHORT TERM GOAL #2   Title Pt will report low back pain to a 5/10 or less in order to improve functional mobility.    Baseline 7/10 pain in bakc and neck (12/24/19); 6/10 01/26/20    Time 3    Period Weeks    Status On-going      PT SHORT TERM GOAL #3   Title Pt will demo proper body mechanics to perform a functional squat in order to improve functional mobility and decr low back pain.    Baseline pt's movements are very guarded    Time 3    Period Weeks    Status On-going      PT SHORT  TERM GOAL #4   Title Pt will demo 50 deg of cervical rotation bil with pain <4/10 to improve ability to turn head while driving    Baseline 40 deg bil cervical rotation with pain of 8/10    Time 4    Period Weeks    Status Achieved    Target Date 01/12/20      PT SHORT TERM GOAL #5   Title Patient will report 50% improvement in intermittent radicular symptoms in R UE with functional activities    Time 4    Period Weeks    Status On-going    Target Date 01/12/20             PT Long Term Goals - 01/26/20 1600      PT LONG TERM GOAL #1   Title Pt will decr ODI score to at least 45% in order to demo decr disability. ALL LTGS DUE 01/19/20    Baseline 55% on 12/11/19, 48% on 01/29/20    Time 6    Period Weeks    Status On-going    Target Date 02/26/20      PT LONG TERM GOAL #2   Title Pt will report 3/10 or less low back pain in order to improve functional mobility and return to work.    Baseline 6/10 on 01/26/20    Time 6    Period Weeks    Status Revised    Target Date 02/26/20      PT LONG TERM GOAL #3   Title Patient will demo 20 points improvement on FABQ to improve overall function and help to return back to work    Baseline 75/96 (12/24/19); 58/96 (01/26/20)    Time 6    Period Weeks    Status New      PT LONG TERM GOAL #4   Title Pt will report being able to perform standing/walking activities for at least 30 minutes in order to improve activity tolerance for work.    Baseline able to perform work for 8 hours with 6-7/10 pain in back.    Time 6    Period Weeks    Status Achieved  PT LONG TERM GOAL #5   Title Patient will be able to turn head to 60 deg bil with <3/10 pain to improve ability to turn head while driving    Baseline 65 deg bil 01/05/20    Time 8    Period Weeks    Status Achieved      PT LONG TERM GOAL #6   Title Patient will be able to carry 40lbs for about 20 lbs to be able to return back to work with modified duty    Baseline able to carry 20  lbs    Time 8    Period Weeks    Status On-going                 Plan - 02/02/20 1410    Clinical Impression Statement Pt with improved neck pain and AROM; however, still has intermittent radicular symptoms. Treatment session thus focused on added scapula and neck retractions for postural strengthening. Full squats and deadlifts continue to increase pt pain; therefore, treatment focused on mini squats, hip hinging, and abdominal bracing with sitting and partial standing. Performed and added lateral shifting and extension for trial of McKenzie methodology for pt to manage low back symptoms at home.    Personal Factors and Comorbidities Comorbidity 3+;Time since onset of injury/illness/exacerbation;Past/Current Experience    Comorbidities HLD, HTN, diabetes, hx of sinusitis    Examination-Activity Limitations Bed Mobility;Bend;Locomotion Level;Caring for Others;Stand;Stairs;Squat;Transfers;Sit    Examination-Participation Restrictions Community Activity    Stability/Clinical Decision Making Evolving/Moderate complexity    Rehab Potential Good    PT Frequency 2x / week    PT Duration 6 weeks   8 sessions total (in 6 weeks)   PT Treatment/Interventions ADLs/Self Care Home Management;Gait training;Stair training;Functional mobility training;Therapeutic activities;Therapeutic exercise;Patient/family education;Manual techniques;Passive range of motion    PT Next Visit Plan Manual therapy if indicated. Gentle stretching/weight shifting at counter or try standing lumbar extension over counter, core stabilization, any other gentle movement as pt's whole body is very guarded, NuStep, if supine put pt on wedge or with incr pillows - pt can't tolerate any prone position    PT Home Exercise Plan Access Code: [email or web address]    Consulted and Agree with Plan of Care Patient           Patient will benefit from skilled therapeutic intervention in order to improve the following deficits and impairments:   Abnormal gait, Decreased activity tolerance, Decreased range of motion, Decreased mobility, Decreased strength, Difficulty walking, Hypomobility, Impaired flexibility, Impaired sensation, Increased muscle spasms, Postural dysfunction, Improper body mechanics, Pain  Visit Diagnosis: Acute midline low back pain, unspecified whether sciatica present  Abnormal posture  Radiculopathy, cervical region  Other abnormalities of gait and mobility     Problem List Patient Active Problem List   Diagnosis Date Noted   Emesis    Headache    Acute recurrent sinusitis    SIRS (systemic inflammatory response syndrome) (HCC) 11/27/2019   Acute back pain 11/27/2019   Fever 11/27/2019   DM (diabetes mellitus), type 2 (HCC) 11/27/2019   History of frequent upper respiratory infection 10/07/2019   Other allergic rhinitis 10/07/2019   Urticaria 10/07/2019   Not well controlled severe persistent asthma 10/06/2019    Eduardo Honor April Randalyn Rhea Keiosha Cancro 02/02/2020, 2:18 PM  [facility] [address] [place], [place], [address] Phone: [phone]   Fax:  [phone]  Name: Bryan Lopez MRN: [id number] Date of Birth: 03/02/1961

## 2020-02-04 ENCOUNTER — Ambulatory Visit: Payer: BC Managed Care – PPO | Admitting: Physical Therapy

## 2020-02-04 ENCOUNTER — Encounter: Payer: Self-pay | Admitting: Physical Therapy

## 2020-02-04 ENCOUNTER — Other Ambulatory Visit: Payer: Self-pay

## 2020-02-04 DIAGNOSIS — M549 Dorsalgia, unspecified: Secondary | ICD-10-CM

## 2020-02-04 DIAGNOSIS — R293 Abnormal posture: Secondary | ICD-10-CM

## 2020-02-04 DIAGNOSIS — M545 Low back pain, unspecified: Secondary | ICD-10-CM

## 2020-02-04 DIAGNOSIS — M5412 Radiculopathy, cervical region: Secondary | ICD-10-CM

## 2020-02-04 DIAGNOSIS — R2689 Other abnormalities of gait and mobility: Secondary | ICD-10-CM

## 2020-02-04 DIAGNOSIS — M542 Cervicalgia: Secondary | ICD-10-CM

## 2020-02-04 NOTE — Therapy (Signed)
[facility] [address] [place], [place], [address] Phone: [phone]   Fax:  [phone]  Physical Therapy Treatment  Patient Details  Name: Bryan Lopez MRN: [id number] Date of Birth: 05-Mar-1961 Referring Provider (PT): Rodolph Bong, MD   Encounter Date: 02/04/2020   PT End of Session - 02/04/20 1430    Visit Number 15    Number of Visits 21    Date for PT Re-Evaluation 03/11/20    Authorization Type [employer]; recert 12/15/19 with added cervical diagnosis, PN 12/24/19, Recert 01/29/20 (8 sessions, 2x/week)    PT Start Time 1435    Equipment Utilized During Treatment Gait belt    Activity Tolerance Patient limited by pain    Behavior During Therapy [facility] for tasks assessed/performed           Past Medical History:  Diagnosis Date  . Crohn disease (HCC)   . Frequent sinus infections   . GERD (gastroesophageal reflux disease)   . History of ear infections   . Hx of bronchitis   . Hyperlipidemia   . Hypertension   . Urticaria     Past Surgical History:  Procedure Laterality Date  . Cardiac Stents    . Sinus Surgeries      There were no vitals filed for this visit.   Subjective Assessment - 02/04/20 1442    Subjective Pt reports that the stretches have been helping. He's feeling pretty good today.    Pertinent History asthma, frequent respiratory infections, HLD, HTN    Limitations Lifting;Standing;Walking;Sitting    How long can you stand comfortably? approx. 15-20 minutes    How long can you walk comfortably? can walk - it just hurts, can walk smaller household distances    Diagnostic tests MRI cervical, lumbar and thoracic spine 11/27/19: No significant abnormality identified. Mild degenerative changes are present.    Patient Stated Goals wants to be able to bend and move and get back to work    Currently in Pain? Yes    Pain Score 5     Pain Location Back    Pain Orientation Right;Left    Pain  Descriptors / Indicators Other (Comment)   "feels like shin splints"   Pain Type Acute pain    Pain Radiating Towards L shin    Pain Onset More than a month ago            Manual therapy:   STM QL and upper glute bilaterally    Prone:  Extensions on elbows 2x10  Leg extension 2x10  Arm raises 2x10  I's, Y's, T's 2x10 each   Seated:  Upper trap stretch with towel 5x5" sec bilat   Scalene stretch with towel x30 sec  Chin tuck 10 x 2" with yellow tband   Standing:  Palloff press with green tband 2x10  Hip hinge against wall x10  Short range deadlift with 5# x10             PT Short Term Goals - 01/26/20 1558      PT SHORT TERM GOAL #1   Title Pt will undergo further assessment of ODI with LTG to be written as appropriate. ALL STGS DUE 12/29/19    Baseline performed on 12/11/19: 55% = severe disability; 48% on 01/27/20    Time 3    Period Weeks    Status Achieved    Target Date 12/29/19      PT SHORT TERM GOAL #2   Title Pt  will report low back pain to a 5/10 or less in order to improve functional mobility.    Baseline 7/10 pain in bakc and neck (12/24/19); 6/10 01/26/20    Time 3    Period Weeks    Status On-going      PT SHORT TERM GOAL #3   Title Pt will demo proper body mechanics to perform a functional squat in order to improve functional mobility and decr low back pain.    Baseline pt's movements are very guarded    Time 3    Period Weeks    Status On-going      PT SHORT TERM GOAL #4   Title Pt will demo 50 deg of cervical rotation bil with pain <4/10 to improve ability to turn head while driving    Baseline 40 deg bil cervical rotation with pain of 8/10    Time 4    Period Weeks    Status Achieved    Target Date 01/12/20      PT SHORT TERM GOAL #5   Title Patient will report 50% improvement in intermittent radicular symptoms in R UE with functional activities    Time 4    Period Weeks    Status On-going    Target Date 01/12/20              PT Long Term Goals - 01/26/20 1600      PT LONG TERM GOAL #1   Title Pt will decr ODI score to at least 45% in order to demo decr disability. ALL LTGS DUE 01/19/20    Baseline 55% on 12/11/19, 48% on 01/29/20    Time 6    Period Weeks    Status On-going    Target Date 02/26/20      PT LONG TERM GOAL #2   Title Pt will report 3/10 or less low back pain in order to improve functional mobility and return to work.    Baseline 6/10 on 01/26/20    Time 6    Period Weeks    Status Revised    Target Date 02/26/20      PT LONG TERM GOAL #3   Title Patient will demo 20 points improvement on FABQ to improve overall function and help to return back to work    Baseline 75/96 (12/24/19); 58/96 (01/26/20)    Time 6    Period Weeks    Status New      PT LONG TERM GOAL #4   Title Pt will report being able to perform standing/walking activities for at least 30 minutes in order to improve activity tolerance for work.    Baseline able to perform work for 8 hours with 6-7/10 pain in back.    Time 6    Period Weeks    Status Achieved      PT LONG TERM GOAL #5   Title Patient will be able to turn head to 60 deg bil with <3/10 pain to improve ability to turn head while driving    Baseline 65 deg bil 01/05/20    Time 8    Period Weeks    Status Achieved      PT LONG TERM GOAL #6   Title Patient will be able to carry 40lbs for about 20 lbs to be able to return back to work with modified duty    Baseline able to carry 20 lbs    Time 8    Period Weeks    Status On-going  Plan - 02/04/20 1504    Clinical Impression Statement Treatment session focused on low back and mid/scapular stabilization. Pt reports deep L groin tightness/ache -- PT provided manual therapy. Pt reports continued radiculopathy into hands -- session provided stretches for neck and strengthening for stability. Performed core stabilization and deadlifts in pain free range.    Personal Factors and  Comorbidities Comorbidity 2    Comorbidities HLD, HTN, diabetes, hx of sinusitis    Examination-Activity Limitations Bed Mobility;Bend;Locomotion Level;Caring for Others;Stand;Stairs;Squat;Transfers;Sit    Stability/Clinical Decision Making Evolving/Moderate complexity    Rehab Potential Good    PT Frequency 2x / week    PT Duration 6 weeks    PT Treatment/Interventions ADLs/Self Care Home Management;Gait training;Stair training;Functional mobility training;Therapeutic activities;Therapeutic exercise;Patient/family education;Manual techniques;Passive range of motion    PT Next Visit Plan Manual therapy if indicated. Gentle stretching/weight shifting at counter or try standing lumbar extension over counter, core stabilization, any other gentle movement as pt's whole body is very guarded, NuStep, if supine put pt on wedge or with incr pillows - pt can't tolerate any prone position    PT Home Exercise Plan Access Code: [email or web address]    Consulted and Agree with Plan of Care Patient           Patient will benefit from skilled therapeutic intervention in order to improve the following deficits and impairments:  Abnormal gait, Decreased activity tolerance, Decreased range of motion, Decreased mobility, Decreased strength, Difficulty walking, Hypomobility, Impaired flexibility, Impaired sensation, Increased muscle spasms, Postural dysfunction, Improper body mechanics, Pain  Visit Diagnosis: Acute midline low back pain, unspecified whether sciatica present  Abnormal posture  Radiculopathy, cervical region  Other abnormalities of gait and mobility  Neck pain  Upper back pain     Problem List Patient Active Problem List   Diagnosis Date Noted  . Emesis   . Headache   . Acute recurrent sinusitis   . SIRS (systemic inflammatory response syndrome) (HCC) 11/27/2019  . Acute back pain 11/27/2019  . Fever 11/27/2019  . DM (diabetes mellitus), type 2 (HCC) 11/27/2019  . History of frequent upper  respiratory infection 10/07/2019  . Other allergic rhinitis 10/07/2019  . Urticaria 10/07/2019  . Not well controlled severe persistent asthma 10/06/2019    [facility] April Ma L Dayton PT, DPT 02/04/2020, 4:42 PM  [facility] [address] [place], [place], [address] Phone: [phone]   Fax:  [phone]  Name: Jyden Kromer MRN: [id number] Date of Birth: June 04, 1960

## 2020-02-09 ENCOUNTER — Ambulatory Visit: Payer: BC Managed Care – PPO | Admitting: Physical Therapy

## 2020-02-11 ENCOUNTER — Encounter: Payer: Self-pay | Admitting: Physical Therapy

## 2020-02-11 ENCOUNTER — Other Ambulatory Visit: Payer: Self-pay

## 2020-02-11 ENCOUNTER — Ambulatory Visit (INDEPENDENT_AMBULATORY_CARE_PROVIDER_SITE_OTHER): Payer: BC Managed Care – PPO

## 2020-02-11 ENCOUNTER — Ambulatory Visit: Payer: BC Managed Care – PPO | Admitting: Physical Therapy

## 2020-02-11 DIAGNOSIS — M5412 Radiculopathy, cervical region: Secondary | ICD-10-CM

## 2020-02-11 DIAGNOSIS — J4550 Severe persistent asthma, uncomplicated: Secondary | ICD-10-CM

## 2020-02-11 DIAGNOSIS — M542 Cervicalgia: Secondary | ICD-10-CM

## 2020-02-11 DIAGNOSIS — M545 Low back pain, unspecified: Secondary | ICD-10-CM

## 2020-02-11 DIAGNOSIS — M549 Dorsalgia, unspecified: Secondary | ICD-10-CM

## 2020-02-11 DIAGNOSIS — R293 Abnormal posture: Secondary | ICD-10-CM

## 2020-02-11 DIAGNOSIS — R2689 Other abnormalities of gait and mobility: Secondary | ICD-10-CM

## 2020-02-11 NOTE — Therapy (Signed)
[facility] Out[facility] Rehabilitation [facility] [address] [place], [place], [address] Phone: [phone]   Fax:  [phone]  Physical Therapy Treatment  Patient Details  Name: Bryan Lopez MRN: [id number] Date of Birth: 04/22/1961 Referring Provider (PT): Rodolph Bong, MD   Encounter Date: 02/11/2020 [facility] Out[facility] Rehabilitation [facility] [address] [place], [place], [address] Phone: [phone]   Fax:  [phone]  Physical Therapy Treatment  Patient Details  Name: Bryan Lopez MRN: [id number] Date of Birth: 1960-10-13 Referring Provider (PT): Rodolph Bong, MD   Encounter Date: 02/11/2020   PT End of Session - 02/11/20 1421    Visit Number 16    Number of Visits 21    Date for PT Re-Evaluation 03/11/20    Authorization Type [employer]; recert 12/15/19 with added cervical diagnosis, PN 12/24/19, Recert 01/29/20 (8 sessions, 2x/week)    PT Start Time 1320    PT Stop Time 1401    PT Time Calculation (min) 41 min    Equipment Utilized During Treatment Gait belt    Activity Tolerance Patient limited by pain    Behavior During Therapy [facility] for tasks assessed/performed           Past Medical History:  Diagnosis Date  . Crohn disease (HCC)   . Frequent sinus infections   . GERD (gastroesophageal reflux disease)   . History of ear infections   . Hx of bronchitis   . Hyperlipidemia   . Hypertension   . Urticaria     Past Surgical History:  Procedure Laterality Date  . Cardiac Stents    . Sinus Surgeries      There were no vitals filed for this visit.   Subjective Assessment - 02/11/20 1326    Subjective Pt reports he was able to stretch a little bit yesterday. Pt notes he drove for ~12 hours in the last 2 days and is hurting a lot today.    Pertinent History asthma, frequent respiratory infections, HLD, HTN    Limitations Lifting;Standing;Walking;Sitting    How long can you stand comfortably?  approx. 15-20 minutes    How long can you walk comfortably? can walk - it just hurts, can walk smaller household distances    Diagnostic tests MRI cervical, lumbar and thoracic spine 11/27/19: No significant abnormality identified. Mild degenerative changes are present.    Patient Stated Goals wants to be able to bend and move and get back to work    Currently in Pain? Yes    Pain Score 7     Pain Location Back    Pain Orientation Right;Left;Lower    Pain Type Acute pain;Chronic pain    Pain Radiating Towards L shin    Pain Onset More than a month ago            Manual therapy:   STM QL and upper glute bilaterally; bilat hip  flexors/psoas  Supine:  Single knee to chest x30 sec bilat  Figure 4 stretch x30 sec bilat      Prone:  Extensions on hands x10  Alternating UE & LE x10 bilat with 5 sec hold  Plank 20 sec x 2  Seated trunk extension x30 sec Lateral hip shifts x30 sec bilat          PT Education - 02/11/20 1417    Education Details Reinforced using tennis ball for self trigger point release/massage.    Person(s) Educated Patient    Methods Explanation    Comprehension Verbalized  understanding            PT Short Term Goals - 01/26/20 1558      PT SHORT TERM GOAL #1   Title Pt will undergo further assessment of ODI with LTG to be written as appropriate. ALL STGS DUE 12/29/19    Baseline performed on 12/11/19: 55% = severe disability; 48% on 01/27/20    Time 3    Period Weeks    Status Achieved    Target Date 12/29/19      PT SHORT TERM GOAL #2   Title Pt will report low back pain to a 5/10 or less in order to improve functional mobility.    Baseline 7/10 pain in bakc and neck (12/24/19); 6/10 01/26/20    Time 3    Period Weeks    Status On-going      PT SHORT TERM GOAL #3   Title Pt will demo proper body mechanics to perform a functional squat in order to improve functional mobility and decr low back pain.    Baseline pt's movements are very guarded     Time 3    Period Weeks    Status On-going      PT SHORT TERM GOAL #4   Title Pt will demo 50 deg of cervical rotation bil with pain <4/10 to improve ability to turn head while driving    Baseline 40 deg bil cervical rotation with pain of 8/10    Time 4    Period Weeks    Status Achieved    Target Date 01/12/20      PT SHORT TERM GOAL #5   Title Patient will report 50% improvement in intermittent radicular symptoms in R UE with functional activities    Time 4    Period Weeks    Status On-going    Target Date 01/12/20             PT Long Term Goals - 01/26/20 1600      PT LONG TERM GOAL #1   Title Pt will decr ODI score to at least 45% in order to demo decr disability. ALL LTGS DUE 01/19/20    Baseline 55% on 12/11/19, 48% on 01/29/20    Time 6    Period Weeks    Status On-going    Target Date 02/26/20      PT LONG TERM GOAL #2   Title Pt will report 3/10 or less low back pain in order to improve functional mobility and return to work.    Baseline 6/10 on 01/26/20    Time 6    Period Weeks    Status Revised    Target Date 02/26/20      PT LONG TERM GOAL #3   Title Patient will demo 20 points improvement on FABQ to improve overall function and help to return back to work    Baseline 75/96 (12/24/19); 58/96 (01/26/20)    Time 6    Period Weeks    Status New      PT LONG TERM GOAL #4   Title Pt will report being able to perform standing/walking activities for at least 30 minutes in order to improve activity tolerance for work.    Baseline able to perform work for 8 hours with 6-7/10 pain in back.    Time 6    Period Weeks    Status Achieved      PT LONG TERM GOAL #5   Title Patient will be able to  turn head to 60 deg bil with <3/10 pain to improve ability to turn head while driving    Baseline 65 deg bil 01/05/20    Time 8    Period Weeks    Status Achieved      PT LONG TERM GOAL #6   Title Patient will be able to carry 40lbs for about 20 lbs to be able to return  back to work with modified duty    Baseline able to carry 20 lbs    Time 8    Period Weeks    Status On-going                 Plan - 02/11/20 1417    Clinical Impression Statement Pt comes into clinic with increased back pain and stiffness after driving for almost 12 hours within the past few days. Treatment session focused on manual therapy and stretching to address pt's pain and provided core stabilization exercises. Pt reports reduction of pain to 4/10 by end of session    Personal Factors and Comorbidities Comorbidity 2    Comorbidities HLD, HTN, diabetes, hx of sinusitis    Examination-Activity Limitations Bed Mobility;Bend;Locomotion Level;Caring for Others;Stand;Stairs;Squat;Transfers;Sit    Examination-Participation Restrictions Community Activity    Rehab Potential Good    PT Frequency 2x / week    PT Duration 6 weeks    PT Treatment/Interventions ADLs/Self Care Home Management;Gait training;Stair training;Functional mobility training;Therapeutic activities;Therapeutic exercise;Patient/family education;Manual techniques;Passive range of motion    PT Next Visit Plan Manual therapy if indicated. Hip strengthening and stretches. Continue core stabilization exercises. Work on improving pain free deadlifts/functional strengthening/lifting.    PT Home Exercise Plan Access Code: [email or web address]    Consulted and Agree with Plan of Care Patient           Patient will benefit from skilled therapeutic intervention in order to improve the following deficits and impairments:  Abnormal gait, Decreased activity tolerance, Decreased range of motion, Decreased mobility, Decreased strength, Difficulty walking, Hypomobility, Impaired flexibility, Impaired sensation, Increased muscle spasms, Postural dysfunction, Improper body mechanics, Pain  Visit Diagnosis: Acute midline low back pain, unspecified whether sciatica present  Abnormal posture  Radiculopathy, cervical region  Other  abnormalities of gait and mobility  Neck pain  Upper back pain     Problem List Patient Active Problem List   Diagnosis Date Noted  . Emesis   . Headache   . Acute recurrent sinusitis   . SIRS (systemic inflammatory response syndrome) (HCC) 11/27/2019  . Acute back pain 11/27/2019  . Fever 11/27/2019  . DM (diabetes mellitus), type 2 (HCC) 11/27/2019  . History of frequent upper respiratory infection 10/07/2019  . Other allergic rhinitis 10/07/2019  . Urticaria 10/07/2019  . Not well controlled severe persistent asthma 10/06/2019    Bryan Lopez PT, DPT 02/11/2020, 2:23 PM  [facility] [address] [place], [place], [address] Phone: [phone]   Fax:  [phone]  Name: Bryan Lopez MRN: [id number] Date of Birth: 30-Nov-1960    PT End of Session - 02/11/20 1421    Visit Number 16    Number of Visits 21    Date for PT Re-Evaluation 03/11/20    Authorization Type [employer]; recert 12/15/19 with added cervical diagnosis, PN 12/24/19, Recert 01/29/20 (8 sessions, 2x/week)    PT Start Time 1320    PT Stop Time 1401    PT Time Calculation (min) 41 min    Equipment Utilized During Treatment Gait  belt    Activity Tolerance Patient limited by pain    Behavior During Therapy [facility] for tasks assessed/performed           Past Medical History:  Diagnosis Date  . Crohn disease (HCC)   . Frequent sinus infections   . GERD (gastroesophageal reflux disease)   . History of ear infections   . Hx of bronchitis   . Hyperlipidemia   . Hypertension   . Urticaria     Past Surgical History:  Procedure Laterality Date  . Cardiac Stents    . Sinus Surgeries      There were no vitals filed for this visit.   Subjective Assessment - 02/11/20 1326    Subjective Pt reports he was able to stretch a little bit yesterday. Pt notes he drove for ~12 hours in the last 2 days and is hurting a lot today.    Pertinent  History asthma, frequent respiratory infections, HLD, HTN    Limitations Lifting;Standing;Walking;Sitting    How long can you stand comfortably? approx. 15-20 minutes    How long can you walk comfortably? can walk - it just hurts, can walk smaller household distances    Diagnostic tests MRI cervical, lumbar and thoracic spine 11/27/19: No significant abnormality identified. Mild degenerative changes are present.    Patient Stated Goals wants to be able to bend and move and get back to work    Currently in Pain? Yes    Pain Score 7     Pain Location Back    Pain Orientation Right;Left;Lower    Pain Type Acute pain;Chronic pain    Pain Radiating Towards L shin    Pain Onset More than a month ago                                     PT Education - 02/11/20 1417    Education Details Reinforced using tennis ball for self trigger point release/massage.    Person(s) Educated Patient    Methods Explanation    Comprehension Verbalized understanding            PT Short Term Goals - 01/26/20 1558      PT SHORT TERM GOAL #1   Title Pt will undergo further assessment of ODI with LTG to be written as appropriate. ALL STGS DUE 12/29/19    Baseline performed on 12/11/19: 55% = severe disability; 48% on 01/27/20    Time 3    Period Weeks    Status Achieved    Target Date 12/29/19      PT SHORT TERM GOAL #2   Title Pt will report low back pain to a 5/10 or less in order to improve functional mobility.    Baseline 7/10 pain in bakc and neck (12/24/19); 6/10 01/26/20    Time 3    Period Weeks    Status On-going      PT SHORT TERM GOAL #3   Title Pt will demo proper body mechanics to perform a functional squat in order to improve functional mobility and decr low back pain.    Baseline pt's movements are very guarded    Time 3    Period Weeks    Status On-going      PT SHORT TERM GOAL #4   Title Pt will demo 50 deg of cervical rotation bil with pain <4/10 to improve  ability to turn head while  driving    Baseline 40 deg bil cervical rotation with pain of 8/10    Time 4    Period Weeks    Status Achieved    Target Date 01/12/20      PT SHORT TERM GOAL #5   Title Patient will report 50% improvement in intermittent radicular symptoms in R UE with functional activities    Time 4    Period Weeks    Status On-going    Target Date 01/12/20             PT Long Term Goals - 01/26/20 1600      PT LONG TERM GOAL #1   Title Pt will decr ODI score to at least 45% in order to demo decr disability. ALL LTGS DUE 01/19/20    Baseline 55% on 12/11/19, 48% on 01/29/20    Time 6    Period Weeks    Status On-going    Target Date 02/26/20      PT LONG TERM GOAL #2   Title Pt will report 3/10 or less low back pain in order to improve functional mobility and return to work.    Baseline 6/10 on 01/26/20    Time 6    Period Weeks    Status Revised    Target Date 02/26/20      PT LONG TERM GOAL #3   Title Patient will demo 20 points improvement on FABQ to improve overall function and help to return back to work    Baseline 75/96 (12/24/19); 58/96 (01/26/20)    Time 6    Period Weeks    Status New      PT LONG TERM GOAL #4   Title Pt will report being able to perform standing/walking activities for at least 30 minutes in order to improve activity tolerance for work.    Baseline able to perform work for 8 hours with 6-7/10 pain in back.    Time 6    Period Weeks    Status Achieved      PT LONG TERM GOAL #5   Title Patient will be able to turn head to 60 deg bil with <3/10 pain to improve ability to turn head while driving    Baseline 65 deg bil 01/05/20    Time 8    Period Weeks    Status Achieved      PT LONG TERM GOAL #6   Title Patient will be able to carry 40lbs for about 20 lbs to be able to return back to work with modified duty    Baseline able to carry 20 lbs    Time 8    Period Weeks    Status On-going                 Plan - 02/11/20  1417    Clinical Impression Statement Pt comes into clinic with increased back pain and stiffness after driving for almost 12 hours within the past few days. Treatment session focused on manual therapy and stretching to address pt's pain and provided core stabilization exercises. Pt reports reduction of pain to 4/10 by end of session    Personal Factors and Comorbidities Comorbidity 2    Comorbidities HLD, HTN, diabetes, hx of sinusitis    Examination-Activity Limitations Bed Mobility;Bend;Locomotion Level;Caring for Others;Stand;Stairs;Squat;Transfers;Sit    Examination-Participation Restrictions Community Activity    Rehab Potential Good    PT Frequency 2x / week    PT Duration 6 weeks  PT Treatment/Interventions ADLs/Self Care Home Management;Gait training;Stair training;Functional mobility training;Therapeutic activities;Therapeutic exercise;Patient/family education;Manual techniques;Passive range of motion    PT Next Visit Plan Manual therapy if indicated. Hip strengthening and stretches. Continue core stabilization exercises. Work on improving pain free deadlifts/functional strengthening/lifting.    PT Home Exercise Plan Access Code: [email or web address]    Consulted and Agree with Plan of Care Patient           Patient will benefit from skilled therapeutic intervention in order to improve the following deficits and impairments:  Abnormal gait, Decreased activity tolerance, Decreased range of motion, Decreased mobility, Decreased strength, Difficulty walking, Hypomobility, Impaired flexibility, Impaired sensation, Increased muscle spasms, Postural dysfunction, Improper body mechanics, Pain  Visit Diagnosis: Acute midline low back pain, unspecified whether sciatica present  Abnormal posture  Radiculopathy, cervical region  Other abnormalities of gait and mobility  Neck pain  Upper back pain     Problem List Patient Active Problem List   Diagnosis Date Noted  . Emesis   . Headache    . Acute recurrent sinusitis   . SIRS (systemic inflammatory response syndrome) (HCC) 11/27/2019  . Acute back pain 11/27/2019  . Fever 11/27/2019  . DM (diabetes mellitus), type 2 (HCC) 11/27/2019  . History of frequent upper respiratory infection 10/07/2019  . Other allergic rhinitis 10/07/2019  . Urticaria 10/07/2019  . Not well controlled severe persistent asthma 10/06/2019    [facility] [address] [place] PT, DPT 02/11/2020, 2:23 PM  [facility] [address] [place], [place], [address] Phone: [phone]   Fax:  [phone]  Name: Vash Quezada MRN: [id number] Date of Birth: Sep 09, 1960

## 2020-02-16 ENCOUNTER — Ambulatory Visit: Payer: BC Managed Care – PPO | Admitting: Allergy

## 2020-02-16 NOTE — Progress Notes (Deleted)
Follow Up Note  RE: Bryan Lopez MRN: [id number] DOB: 07-15-1960 Date of Office Visit: 02/16/2020  Referring provider: April Manson, NP Primary care provider: April Manson, NP  Chief Complaint: No chief complaint on file.  History of Present Illness: I had the pleasure of seeing Bryan Lopez for a follow up visit at the [facility] on 02/16/2020. He is a 59 y.o. male, who is being followed for asthma on Dupixent, history of frequent upper respiratory infections, allergic rhinitis and history of urticaria. His previous allergy office visit was on 12/15/2019 with Dr. Selena Batten. Today is a regular follow up visit.  Not well controlled severe persistent asthma Past history - Diagnosed with asthma 3-4 years ago. Currently on Trelegy 200 1 puff daily x 1 month, Singulair, albuterol prn. Recently started on Dupixent injections in [place] and due for his injection. 7-8 courses of prednisone per year with minimal benefit. Has reflux and takes Protonix. Not up to date with COVID-19 vaccine. 2021 spirometry showed some restriction. Normal alpha-1 level. Normal CXR. Interim history - patient's last Dupixent was 2 months ago due to [place] out of state and recent hospitalization for back pain and fever. Not sure if Bryan Lopez (given in hospital) worked better than Trelegy.  Get flutter valve.   Continue with Dupixent injections every 2 weeks. ? If it's difficult for you to come into our office we can always switch to at home injections.   Daily controller medication(s):continue with Trelegy 1 puff daily and rinse mouth afterwards.  May use albuterol rescue inhaler 2 puffs every 4 to 6 hours as needed for shortness of breath, chest tightness, coughing, and wheezing. May use albuterol rescue inhaler 2 puffs 5 to 15 minutes prior to strenuous physical activities. Monitor frequency of use.   Will get spirometry at next visit instead of today due to COVID-19 pandemic and trying to minimize  any type of aerosolizing procedures at this time in the office.   History of frequent upper respiratory infection Past history - Frequent sinusitis, pneumonia, bronchitis and ear infections. 6-8 courses of antibiotics the last 12 months.  Previous allergist notes - Frequent infections needing 10+ courses of antibiotics and 7 courses of prednisone within 12 month. IgM deficiency with subclass deficiency. Good response to Prevnar in the past. Consider starting on IgG replacement therapy. Past labs - IgG 855, IgA normal 99, IgM 39L and IgE 10; IgG1 494, IgG2 227L, IgG3 9L, IgG4 50.  Interim history - sinus infection and treat with antibiotics while in the hospital. 2021 IgA, IgM and IgG levels were normal. IgG3 subclass was lower than normal. Pneumococcal, diptheria and tetanus titers present.   Keep track of infections and antibiotics use.  Will consider starting on lower dose of IgG replacement therapy if infections persistent.   Okay to get COVID-19 vaccine.  Keep ENT appointment.  Keep PCP appointment.   Other allergic rhinitis Past history - Perennial rhinoconjunctivitis symptoms for the past 3 to 4 years.  Used Flonase, Singulair and Allegra with some benefit.  Apparently he had 6-7 sinus surgeries due to MRSA infection.  No history of polyps. skin testing a few months ago was positive to ragweed, grass, dust mites and cockroach per patient report.  No previous allergy immunotherapy. Interim history - still having some sinus symptoms from recent acute sinusitis.  Continue environmental control measures as below.  May use over the counter antihistamines such as Zyrtec (cetirizine), Claritin (loratadine), Allegra (fexofenadine), or Xyzal (levocetirizine) daily  as needed.  May use Flonase 1 spray per nostril 1-2 times a day for nasal congestion.  Continue with Singulair daily.  Nasal saline spray (i.e., Simply Saline) or nasal saline lavage (i.e., NeilMed) is recommended as needed  and prior to medicated nasal sprays.  Keep ENT appointment.   Urticaria Past history - hives outbreak about 5 years ago with no known triggers.  No hives but had some itching.   Return in about 2 months (around 02/14/2020).  Assessment and Plan: Bryan Lopez is a 58 y.o. male with: No problem-specific Assessment & Plan notes found for this encounter.  No follow-ups on file.  No orders of the defined types were placed in this encounter.  Lab Orders  No laboratory test(s) ordered today    Diagnostics: Spirometry:  Tracings reviewed. His effort: {Blank single:19197::"Good reproducible efforts.","It was hard to get consistent efforts and there is a question as to whether this reflects a maximal maneuver.","Poor effort, data can not be interpreted."} FVC: ***L FEV1: ***L, ***% predicted FEV1/FVC ratio: ***% Interpretation: {Blank single:19197::"Spirometry consistent with mild obstructive disease","Spirometry consistent with moderate obstructive disease","Spirometry consistent with severe obstructive disease","Spirometry consistent with possible restrictive disease","Spirometry consistent with mixed obstructive and restrictive disease","Spirometry uninterpretable due to technique","Spirometry consistent with normal pattern","No overt abnormalities noted given today's efforts"}.  Please see scanned spirometry results for details.  Skin Testing: {Blank single:19197::"Select foods","Environmental allergy panel","Environmental allergy panel and select foods","Food allergy panel","None","Deferred due to recent antihistamines use"}. Positive test to: ***. Negative test to: ***.  Results discussed with patient/family.   Medication List:  Current Outpatient Medications  Medication Sig Dispense Refill  . acyclovir in dextrose 5 % 250 mL 300 mg/m2.    Marland Kitchen albuterol (VENTOLIN HFA) 108 (90 Base) MCG/ACT inhaler Inhale 1-2 puffs into the lungs every 4 (four) hours as needed for wheezing.     Marland Kitchen  atorvastatin (LIPITOR) 80 MG tablet Take 80 mg by mouth daily.    . Budeson-Glycopyrrol-Formoterol (BREZTRI AEROSPHERE) 160-9-4.8 MCG/ACT AERO Inhale 2 puffs into the lungs in the morning and at bedtime. Rinse mouth after each use. 10.7 g 5  . DUPIXENT 300 MG/2ML prefilled syringe Inject 300 mg into the skin every 14 (fourteen) days.     Marland Kitchen EPINEPHrine 0.3 mg/0.3 mL IJ SOAJ injection Inject 0.3 mg into the muscle as needed for anaphylaxis.     . fluticasone (FLONASE) 50 MCG/ACT nasal spray Place 1 spray into both nostrils 2 (two) times daily.    Marland Kitchen gabapentin (NEURONTIN) 600 MG tablet Take 1,200 mg by mouth 2 (two) times daily.     . Insulin Glargine-Lixisenatide (SOLIQUA [place]) Inject 40-60 Units into the skin daily. Depending on CBG's    . JARDIANCE 25 MG TABS tablet Take 25 mg by mouth daily.    . montelukast (SINGULAIR) 10 MG tablet Take 10 mg by mouth daily.    . pantoprazole (PROTONIX) 40 MG tablet Take 40 mg by mouth daily.    . pioglitazone (ACTOS) 45 MG tablet 45 mg.    . valsartan (DIOVAN) 160 MG tablet Take 160 mg by mouth daily.     Current Facility-Administered Medications  Medication Dose Route Frequency Provider Last Rate Last Admin  . dupilumab (DUPIXENT) prefilled syringe 300 mg  300 mg Subcutaneous Q14 Days Ellamae Sia, DO   300 mg at 02/11/20 1537   Allergies: No Known Allergies I reviewed his past medical history, social history, family history, and environmental history and no significant changes have been reported from his previous visit.  Review of Systems  Constitutional: Negative for appetite change, chills, fever and unexpected weight change.  HENT: Positive for congestion, postnasal drip, rhinorrhea, sinus pressure and sinus pain.   Eyes: Negative for itching.  Respiratory: Positive for shortness of breath. Negative for cough, chest tightness and wheezing.   Cardiovascular: Negative for chest pain.  Gastrointestinal: Negative for abdominal pain.  Genitourinary:  Negative for difficulty urinating.  Musculoskeletal: Positive for back pain.  Skin: Negative for rash.  Allergic/Immunologic: Positive for environmental allergies. Negative for food allergies.  Neurological: Positive for headaches.   Objective: There were no vitals taken for this visit. There is no height or weight on file to calculate BMI. Physical Exam Vitals and nursing note reviewed.  Constitutional:      Appearance: Normal appearance. He is well-developed.  HENT:     Head: Normocephalic and atraumatic.     Right Ear: Tympanic membrane and external ear normal.     Left Ear: Tympanic membrane and external ear normal.     Nose: Nose normal. No congestion or rhinorrhea.     Mouth/Throat:     Mouth: Mucous membranes are moist.     Pharynx: Oropharynx is clear.  Eyes:     Conjunctiva/sclera: Conjunctivae normal.  Cardiovascular:     Rate and Rhythm: Normal rate and regular rhythm.     Heart sounds: Normal heart sounds. No murmur heard.  No friction rub. No gallop.   Pulmonary:     Effort: Pulmonary effort is normal.     Breath sounds: No wheezing, rhonchi or rales.  Musculoskeletal:     Cervical back: Neck supple.  Skin:    General: Skin is warm.     Findings: No rash.  Neurological:     Mental Status: He is alert and oriented to person, place, and time.  Psychiatric:        Behavior: Behavior normal.    Previous notes and tests were reviewed. The plan was reviewed with the patient/family, and all questions/concerned were addressed.  It was my pleasure to see Bryan Lopez today and participate in his care. Please feel free to contact me with any questions or concerns.  Sincerely,  Wyline Mood, DO Allergy & Immunology  [facility] office: [phone] [facility] office: [phone]

## 2020-02-18 ENCOUNTER — Ambulatory Visit: Payer: BC Managed Care – PPO

## 2020-02-18 ENCOUNTER — Other Ambulatory Visit: Payer: Self-pay

## 2020-02-18 DIAGNOSIS — M545 Low back pain, unspecified: Secondary | ICD-10-CM

## 2020-02-18 DIAGNOSIS — M542 Cervicalgia: Secondary | ICD-10-CM

## 2020-02-18 DIAGNOSIS — R2689 Other abnormalities of gait and mobility: Secondary | ICD-10-CM | POA: Diagnosis not present

## 2020-02-18 DIAGNOSIS — R293 Abnormal posture: Secondary | ICD-10-CM

## 2020-02-18 DIAGNOSIS — M549 Dorsalgia, unspecified: Secondary | ICD-10-CM

## 2020-02-18 NOTE — Therapy (Signed)
[facility] [address] [place], [place], [address] Phone: [phone]   Fax:  [phone]  Physical Therapy Treatment  Patient Details  Name: Bryan Lopez MRN: [id number] Date of Birth: 01/07/1961 Referring Provider (PT): Rodolph Bong, MD   Encounter Date: 02/18/2020   PT End of Session - 02/18/20 1852    Visit Number 17    Number of Visits 21    Date for PT Re-Evaluation 03/11/20    Authorization Type [employer]; recert 12/15/19 with added cervical diagnosis, PN 12/24/19, Recert 01/29/20 (8 sessions, 2x/week)    PT Start Time 1745    PT Stop Time 1830    PT Time Calculation (min) 45 min    Equipment Utilized During Treatment Gait belt    Activity Tolerance Patient limited by pain    Behavior During Therapy [facility] for tasks assessed/performed           Past Medical History:  Diagnosis Date  . Crohn disease (HCC)   . Frequent sinus infections   . GERD (gastroesophageal reflux disease)   . History of ear infections   . Hx of bronchitis   . Hyperlipidemia   . Hypertension   . Urticaria     Past Surgical History:  Procedure Laterality Date  . Cardiac Stents    . Sinus Surgeries      There were no vitals filed for this visit.   Subjective Assessment - 02/18/20 1749    Subjective Pt reports he got 2 nerve blocks on Monday. He had lot of pressure and pain in L back. Pt reports he is still feeling pain down the left leg.    Pertinent History asthma, frequent respiratory infections, HLD, HTN    Limitations Lifting;Standing;Walking;Sitting    How long can you stand comfortably? approx. 15-20 minutes    How long can you walk comfortably? can walk - it just hurts, can walk smaller household distances    Diagnostic tests MRI cervical, lumbar and thoracic spine 11/27/19: No significant abnormality identified. Mild degenerative changes are present.    Patient Stated Goals wants to be able to bend and move and get back to  work    Pain Score 7     Pain Location Back    Pain Orientation Left    Pain Descriptors / Indicators Radiating   radiating in to left leg to left ankle.   Pain Onset More than a month ago                 Lumbar flexion: mid shin with pain in lower back on left side. Lumbar extension: pain going down to L glut L lateral lumbar flexion with Overpressure: pain radiating to L ankle  Prone lateral glides to R with belt: 20x Prone press up with manually mobilizing lateral glide to R: 5x Prone press ups: neutral: 5x Standing lateral glide to R with L lumbar lateral flexion: 2 x 10  Pt education: pt educated on asking for help when lifting, pusing, pulling heavy stuff at work. Pt educated on improving his HEP after work to reduce pain                      PT Short Term Goals - 01/26/20 1558      PT SHORT TERM GOAL #1   Title Pt will undergo further assessment of ODI with LTG to be written as appropriate. ALL STGS DUE 12/29/19    Baseline performed on 12/11/19: 55% =  severe disability; 48% on 01/27/20    Time 3    Period Weeks    Status Achieved    Target Date 12/29/19      PT SHORT TERM GOAL #2   Title Pt will report low back pain to a 5/10 or less in order to improve functional mobility.    Baseline 7/10 pain in bakc and neck (12/24/19); 6/10 01/26/20    Time 3    Period Weeks    Status On-going      PT SHORT TERM GOAL #3   Title Pt will demo proper body mechanics to perform a functional squat in order to improve functional mobility and decr low back pain.    Baseline pt's movements are very guarded    Time 3    Period Weeks    Status On-going      PT SHORT TERM GOAL #4   Title Pt will demo 50 deg of cervical rotation bil with pain <4/10 to improve ability to turn head while driving    Baseline 40 deg bil cervical rotation with pain of 8/10    Time 4    Period Weeks    Status Achieved    Target Date 01/12/20      PT SHORT TERM GOAL #5   Title Patient  will report 50% improvement in intermittent radicular symptoms in R UE with functional activities    Time 4    Period Weeks    Status On-going    Target Date 01/12/20             PT Long Term Goals - 01/26/20 1600      PT LONG TERM GOAL #1   Title Pt will decr ODI score to at least 45% in order to demo decr disability. ALL LTGS DUE 01/19/20    Baseline 55% on 12/11/19, 48% on 01/29/20    Time 6    Period Weeks    Status On-going    Target Date 02/26/20      PT LONG TERM GOAL #2   Title Pt will report 3/10 or less low back pain in order to improve functional mobility and return to work.    Baseline 6/10 on 01/26/20    Time 6    Period Weeks    Status Revised    Target Date 02/26/20      PT LONG TERM GOAL #3   Title Patient will demo 20 points improvement on FABQ to improve overall function and help to return back to work    Baseline 75/96 (12/24/19); 58/96 (01/26/20)    Time 6    Period Weeks    Status New      PT LONG TERM GOAL #4   Title Pt will report being able to perform standing/walking activities for at least 30 minutes in order to improve activity tolerance for work.    Baseline able to perform work for 8 hours with 6-7/10 pain in back.    Time 6    Period Weeks    Status Achieved      PT LONG TERM GOAL #5   Title Patient will be able to turn head to 60 deg bil with <3/10 pain to improve ability to turn head while driving    Baseline 65 deg bil 01/05/20    Time 8    Period Weeks    Status Achieved      PT LONG TERM GOAL #6   Title Patient will be able to carry  40lbs for about 20 lbs to be able to return back to work with modified duty    Baseline able to carry 20 lbs    Time 8    Period Weeks    Status On-going                 Plan - 02/18/20 1851    Clinical Impression Statement Pt demonstrated posterior and lateral components with extension biased exercises. Lateral component provoked radicular symptoms to ankle and posterior component provoide L  glut pain. Extension biased and lateral flexion biased exercises seemed to reduce radicular symptoms by end of the session. Pt was reporting Low back pain L buttock pain but no pain in L knee or L shin at end of the session. Patient continues to need eduation on modifying activities at work to reduce stress on his back by asking for help and taking breaks to perform exercises.    Personal Factors and Comorbidities Comorbidity 2    Comorbidities HLD, HTN, diabetes, hx of sinusitis    Examination-Activity Limitations Bed Mobility;Bend;Locomotion Level;Caring for Others;Stand;Stairs;Squat;Transfers;Sit    Examination-Participation Restrictions Community Activity    Rehab Potential Good    PT Frequency 2x / week    PT Duration 6 weeks    PT Treatment/Interventions ADLs/Self Care Home Management;Gait training;Stair training;Functional mobility training;Therapeutic activities;Therapeutic exercise;Patient/family education;Manual techniques;Passive range of motion    PT Next Visit Plan Manual therapy if indicated. Hip strengthening and stretches. Continue core stabilization exercises. Work on improving pain free deadlifts/functional strengthening/lifting.    PT Home Exercise Plan Access Code: [email or web address]    Consulted and Agree with Plan of Care Patient           Patient will benefit from skilled therapeutic intervention in order to improve the following deficits and impairments:  Abnormal gait, Decreased activity tolerance, Decreased range of motion, Decreased mobility, Decreased strength, Difficulty walking, Hypomobility, Impaired flexibility, Impaired sensation, Increased muscle spasms, Postural dysfunction, Improper body mechanics, Pain  Visit Diagnosis: Acute midline low back pain, unspecified whether sciatica present  Other abnormalities of gait and mobility  Neck pain  Upper back pain  Abnormal posture     Problem List Patient Active Problem List   Diagnosis Date Noted  . Emesis   .  Headache   . Acute recurrent sinusitis   . SIRS (systemic inflammatory response syndrome) (HCC) 11/27/2019  . Acute back pain 11/27/2019  . Fever 11/27/2019  . DM (diabetes mellitus), type 2 (HCC) 11/27/2019  . History of frequent upper respiratory infection 10/07/2019  . Other allergic rhinitis 10/07/2019  . Urticaria 10/07/2019  . Not well controlled severe persistent asthma 10/06/2019    Ileana Ladd 02/18/2020, 6:54 PM  [facility] [address] [place], [place], [address] Phone: [phone]   Fax:  [phone]  Name: Bryan Lopez MRN: [id number] Date of Birth: 26-Aug-1960

## 2020-02-23 ENCOUNTER — Ambulatory Visit: Payer: BC Managed Care – PPO | Admitting: Physical Therapy

## 2020-02-25 ENCOUNTER — Ambulatory Visit: Payer: BC Managed Care – PPO | Attending: Internal Medicine | Admitting: Physical Therapy

## 2020-02-25 ENCOUNTER — Other Ambulatory Visit: Payer: Self-pay

## 2020-02-25 ENCOUNTER — Ambulatory Visit: Payer: Self-pay

## 2020-02-25 DIAGNOSIS — M549 Dorsalgia, unspecified: Secondary | ICD-10-CM | POA: Insufficient documentation

## 2020-02-25 DIAGNOSIS — M545 Low back pain, unspecified: Secondary | ICD-10-CM | POA: Insufficient documentation

## 2020-02-25 DIAGNOSIS — R2689 Other abnormalities of gait and mobility: Secondary | ICD-10-CM

## 2020-02-25 DIAGNOSIS — R293 Abnormal posture: Secondary | ICD-10-CM | POA: Insufficient documentation

## 2020-02-25 NOTE — Therapy (Signed)
[facility] [address] [place], [place], [address] Phone: [phone]   Fax:  [phone]  Physical Therapy Treatment  Patient Details  Name: Bryan Lopez MRN: [id number] Date of Birth: Aug 13, 1960 Referring Provider (PT): Rodolph Bong, MD   Encounter Date: 02/25/2020   PT End of Session - 02/25/20 1338    Visit Number 18    Number of Visits 21    Date for PT Re-Evaluation 03/11/20    Authorization Type BCBS; recert 12/15/19 with added cervical diagnosis, PN 12/24/19, Recert 01/29/20 (8 sessions, 2x/week)    PT Start Time 1317    PT Stop Time 1400    PT Time Calculation (min) 43 min    Equipment Utilized During Treatment Gait belt    Activity Tolerance Patient limited by pain    Behavior During Therapy WFL for tasks assessed/performed           Past Medical History:  Diagnosis Date  . Crohn disease (HCC)   . Frequent sinus infections   . GERD (gastroesophageal reflux disease)   . History of ear infections   . Hx of bronchitis   . Hyperlipidemia   . Hypertension   . Urticaria     Past Surgical History:  Procedure Laterality Date  . Cardiac Stents    . Sinus Surgeries      There were no vitals filed for this visit.   Subjective Assessment - 02/25/20 1322    Subjective Pt states that the nerve blocks didn't seem to help. He had to drive to [place] and back again and had just returned at 4am. He had time to sleep and wake up to go to his appointment.    Pertinent History asthma, frequent respiratory infections, HLD, HTN    Limitations Lifting;Standing;Walking;Sitting    How long can you stand comfortably? approx. 15-20 minutes    How long can you walk comfortably? can walk - it just hurts, can walk smaller household distances    Diagnostic tests MRI cervical, lumbar and thoracic spine 11/27/19: No significant abnormality identified. Mild degenerative changes are present.    Patient Stated Goals wants to  be able to bend and move and get back to work    Currently in Pain? Yes    Pain Score 7     Pain Location Back    Pain Orientation Left    Pain Descriptors / Indicators Tightness    Pain Type Acute pain;Chronic pain    Pain Onset More than a month ago               Prone:  Extensions on elbows x10 with overpressure L4-5  Plank 2x20 sec  Standing:  Lateral shift x10 bilat against wall  Modified downward dog against mat table x30 sec for gastroc stretch  L stretch on counter x30 sec for lower lumbar stretch  Lumbar extensions against counter x30 sec  Hip flexor stretch against counter x30 sec  Quadruped:  Cat/cow 1x10  Alternating arm raise 1x10 bilat  Child's pose x30 sec  Child's pose with lateral flexion L & R x30 sec each  Attempted foam roll; however, too hard for pt  Manual therapy:  STW and TPR bilat QL and paraspinals                     PT Short Term Goals - 01/26/20 1558      PT SHORT TERM GOAL #1   Title Pt will undergo further assessment  of ODI with LTG to be written as appropriate. ALL STGS DUE 12/29/19    Baseline performed on 12/11/19: 55% = severe disability; 48% on 01/27/20    Time 3    Period Weeks    Status Achieved    Target Date 12/29/19      PT SHORT TERM GOAL #2   Title Pt will report low back pain to a 5/10 or less in order to improve functional mobility.    Baseline 7/10 pain in bakc and neck (12/24/19); 6/10 01/26/20    Time 3    Period Weeks    Status On-going      PT SHORT TERM GOAL #3   Title Pt will demo proper body mechanics to perform a functional squat in order to improve functional mobility and decr low back pain.    Baseline pt's movements are very guarded    Time 3    Period Weeks    Status On-going      PT SHORT TERM GOAL #4   Title Pt will demo 50 deg of cervical rotation bil with pain <4/10 to improve ability to turn head while driving    Baseline 40 deg bil cervical rotation with pain of 8/10    Time 4     Period Weeks    Status Achieved    Target Date 01/12/20      PT SHORT TERM GOAL #5   Title Patient will report 50% improvement in intermittent radicular symptoms in R UE with functional activities    Time 4    Period Weeks    Status On-going    Target Date 01/12/20             PT Long Term Goals - 01/26/20 1600      PT LONG TERM GOAL #1   Title Pt will decr ODI score to at least 45% in order to demo decr disability. ALL LTGS DUE 01/19/20    Baseline 55% on 12/11/19, 48% on 01/29/20    Time 6    Period Weeks    Status On-going    Target Date 02/26/20      PT LONG TERM GOAL #2   Title Pt will report 3/10 or less low back pain in order to improve functional mobility and return to work.    Baseline 6/10 on 01/26/20    Time 6    Period Weeks    Status Revised    Target Date 02/26/20      PT LONG TERM GOAL #3   Title Patient will demo 20 points improvement on FABQ to improve overall function and help to return back to work    Baseline 75/96 (12/24/19); 58/96 (01/26/20)    Time 6    Period Weeks    Status New      PT LONG TERM GOAL #4   Title Pt will report being able to perform standing/walking activities for at least 30 minutes in order to improve activity tolerance for work.    Baseline able to perform work for 8 hours with 6-7/10 pain in back.    Time 6    Period Weeks    Status Achieved      PT LONG TERM GOAL #5   Title Patient will be able to turn head to 60 deg bil with <3/10 pain to improve ability to turn head while driving    Baseline 65 deg bil 01/05/20    Time 8    Period Weeks  Status Achieved      PT LONG TERM GOAL #6   Title Patient will be able to carry 40lbs for about 20 lbs to be able to return back to work with modified duty    Baseline able to carry 20 lbs    Time 8    Period Weeks    Status On-going                 Plan - 02/25/20 1359    Clinical Impression Statement Pt comes into clinic with increased stiffness after prolonged  driving for 14 hours. Treatment session focused on continued extension and lateral flexion exercises and stretches. Provided manual therapy to improve tissue extensibility. Continued to progress core strengthening with planks and quadruped positioning. Pt with rating of 4 or 5/10 pain by end of session.    Personal Factors and Comorbidities Comorbidity 2    Comorbidities HLD, HTN, diabetes, hx of sinusitis    Examination-Activity Limitations Bed Mobility;Bend;Locomotion Level;Caring for Others;Stand;Stairs;Squat;Transfers;Sit    Examination-Participation Restrictions Community Activity    Rehab Potential Good    PT Frequency 2x / week    PT Duration 6 weeks    PT Treatment/Interventions ADLs/Self Care Home Management;Gait training;Stair training;Functional mobility training;Therapeutic activities;Therapeutic exercise;Patient/family education;Manual techniques;Passive range of motion    PT Next Visit Plan Manual therapy if indicated. Hip strengthening and stretches. Continue core stabilization exercises. Work on improving pain free deadlifts/functional strengthening/lifting.    PT Home Exercise Plan Access Code: [email or web address]    Consulted and Agree with Plan of Care Patient           Patient will benefit from skilled therapeutic intervention in order to improve the following deficits and impairments:  Abnormal gait, Decreased activity tolerance, Decreased range of motion, Decreased mobility, Decreased strength, Difficulty walking, Hypomobility, Impaired flexibility, Impaired sensation, Increased muscle spasms, Postural dysfunction, Improper body mechanics, Pain  Visit Diagnosis: Acute midline low back pain, unspecified whether sciatica present  Other abnormalities of gait and mobility  Upper back pain  Abnormal posture     Problem List Patient Active Problem List   Diagnosis Date Noted  . Emesis   . Headache   . Acute recurrent sinusitis   . SIRS (systemic inflammatory response  syndrome) (HCC) 11/27/2019  . Acute back pain 11/27/2019  . Fever 11/27/2019  . DM (diabetes mellitus), type 2 (HCC) 11/27/2019  . History of frequent upper respiratory infection 10/07/2019  . Other allergic rhinitis 10/07/2019  . Urticaria 10/07/2019  . Not well controlled severe persistent asthma 10/06/2019    [facility] April Ma L [place] PT, DPT 02/25/2020, 2:13 PM  [facility] [address] [place], [place], [address] Phone: [phone]   Fax:  [phone]  Name: Bryan Lopez MRN: [id number] Date of Birth: [birth year]

## 2020-03-01 ENCOUNTER — Ambulatory Visit (INDEPENDENT_AMBULATORY_CARE_PROVIDER_SITE_OTHER): Payer: BC Managed Care – PPO

## 2020-03-01 ENCOUNTER — Ambulatory Visit: Payer: BC Managed Care – PPO | Admitting: Physical Therapy

## 2020-03-01 ENCOUNTER — Other Ambulatory Visit: Payer: Self-pay

## 2020-03-01 DIAGNOSIS — J4550 Severe persistent asthma, uncomplicated: Secondary | ICD-10-CM | POA: Diagnosis not present

## 2020-03-03 ENCOUNTER — Ambulatory Visit: Payer: BC Managed Care – PPO | Admitting: Physical Therapy

## 2020-03-08 ENCOUNTER — Ambulatory Visit: Payer: BC Managed Care – PPO

## 2020-03-10 ENCOUNTER — Ambulatory Visit: Payer: BC Managed Care – PPO | Admitting: Physical Therapy

## 2020-03-10 ENCOUNTER — Telehealth: Payer: Self-pay | Admitting: Physical Therapy

## 2020-03-10 NOTE — Telephone Encounter (Signed)
Called pt with no answer.  PT left voice mail due to pt with missed PT visit. Called to check on pt because it is atypical for him to miss his appointments without calling. Asked pt to call back [facility] at [phone].   Semaya Vida April Dell Ponto, PT, DPT

## 2020-03-15 ENCOUNTER — Other Ambulatory Visit: Payer: Self-pay

## 2020-03-15 ENCOUNTER — Ambulatory Visit (INDEPENDENT_AMBULATORY_CARE_PROVIDER_SITE_OTHER): Payer: BC Managed Care – PPO

## 2020-03-15 DIAGNOSIS — J4550 Severe persistent asthma, uncomplicated: Secondary | ICD-10-CM | POA: Diagnosis not present

## 2020-03-22 ENCOUNTER — Encounter: Payer: Self-pay | Admitting: Physical Therapy

## 2020-03-22 ENCOUNTER — Ambulatory Visit: Payer: BC Managed Care – PPO | Admitting: Physical Therapy

## 2020-03-22 ENCOUNTER — Other Ambulatory Visit: Payer: Self-pay

## 2020-03-22 DIAGNOSIS — M545 Low back pain, unspecified: Secondary | ICD-10-CM

## 2020-03-22 DIAGNOSIS — R2689 Other abnormalities of gait and mobility: Secondary | ICD-10-CM

## 2020-03-22 DIAGNOSIS — R293 Abnormal posture: Secondary | ICD-10-CM

## 2020-03-22 DIAGNOSIS — M549 Dorsalgia, unspecified: Secondary | ICD-10-CM

## 2020-03-22 NOTE — Therapy (Signed)
[facility] [address] [place], [place], [address] Phone: [phone]   Fax:  [phone]  Physical Therapy Treatment and Re-Certification  Patient Details  Name: Bryan Lopez MRN: [id number] Date of Birth: [birth year] Referring Provider (PT): Eugenie Filler, MD   Encounter Date: 03/22/2020   PT End of Session - 03/22/20 1839    Visit Number 19    Number of Visits 21    Date for PT Re-Evaluation 03/11/20    Authorization Type [employer]BS; recert 3/38/32 with added cervical diagnosis, PN 12/22/89, Recert 66/0/60 (8 sessions, 2x/week); Recert 04/59 (2x/wk for 4 wks)    PT Start Time 1745    PT Stop Time 1830    PT Time Calculation (min) 45 min    Equipment Utilized During Treatment Gait belt    Activity Tolerance Patient limited by pain    Behavior During Therapy WFL for tasks assessed/performed           Past Medical History:  Diagnosis Date   Crohn disease ([place])    Frequent sinus infections    GERD (gastroesophageal reflux disease)    History of ear infections    Hx of bronchitis    Hyperlipidemia    Hypertension    Urticaria     Past Surgical History:  Procedure Laterality Date   Cardiac Stents     Sinus Surgeries      There were no vitals filed for this visit.   Subjective Assessment - 03/22/20 1755    Subjective Pt states that he had to drive back and forth to [place] for his brother (returned home last 9/18). Pt states he is to get more nerve block shots. Pt states he has been lifting almost 85lbs at work and walk a few feet.    Pertinent History asthma, frequent respiratory infections, HLD, HTN    Limitations Lifting;Standing;Walking;Sitting    How long can you stand comfortably? approx. 15-20 minutes    How long can you walk comfortably? can walk - it just hurts, can walk smaller household distances    Diagnostic tests MRI cervical, lumbar and thoracic spine 11/27/19: No significant  abnormality identified. Mild degenerative changes are present.    Patient Stated Goals wants to be able to bend and move and get back to work    Currently in Pain? Yes    Pain Score 6     Pain Location Back    Pain Orientation Right    Pain Descriptors / Indicators Tightness    Pain Onset More than a month ago                   Squat x5 with 30lb kettle bell Amb 50' carrying 30lb kettle bell   Supine:  Piriformis stretch x30 sec bilat   Knee to chest x 30 sec bilat  [place] stretch x 30 sec bilat    Manual therapy:  STW and TPR bilat QL and paraspinals            PT Short Term Goals - 03/22/20 1806      PT SHORT TERM GOAL #1   Title Pt will undergo further assessment of ODI with LTG to be written as appropriate. ALL STGS DUE 12/29/19    Baseline performed on 12/11/19: 55% = severe disability; 48% on 01/27/20    Time 3    Period Weeks    Status Achieved    Target Date 12/29/19      PT SHORT TERM GOAL #  2   Title Pt will report low back pain to a 5/10 or less in order to improve functional mobility.    Baseline 7/10 pain in bakc and neck (12/24/19); 6/10 01/26/20; remains 6/10 prior to PT treatment 11/30 -- improves to 4 or 5 after session    Time 3    Period Weeks    Status Partially Met      PT SHORT TERM GOAL #3   Title Pt will demo proper body mechanics to perform a functional squat in order to improve functional mobility and decr low back pain.    Baseline pt's movements are very guarded; demo safe squat with 35#s on 03/22/20    Time 3    Period Weeks    Status Achieved      PT SHORT TERM GOAL #4   Title Pt will demo 50 deg of cervical rotation bil with pain <4/10 to improve ability to turn head while driving    Baseline 40 deg bil cervical rotation with pain of 8/10    Time 4    Period Weeks    Status Achieved    Target Date 01/12/20      PT SHORT TERM GOAL #5   Title Patient will report 50% improvement in intermittent radicular symptoms in R  UE with functional activities    Baseline Pt reports no change since initial PT visit (03/22/20)    Time 4    Period Weeks    Status On-going    Target Date 01/12/20             PT Long Term Goals - 03/23/20 1811      PT LONG TERM GOAL #1   Title Pt will decr ODI score to at least 45% in order to demo decr disability. ALL LTGS DUE 01/19/20    Baseline 55% on 12/11/19, 48% on 01/29/20; 44% on 03/22/20    Time 6    Period Weeks    Status Achieved      PT LONG TERM GOAL #2   Title Pt will report 3/10 or less low back pain in order to improve functional mobility and return to work.    Baseline 6/10 on 01/26/20    Time 4    Period Weeks    Status Revised    Target Date 04/20/20      PT LONG TERM GOAL #3   Title Patient will demo 20 points improvement on FABQ to improve overall function and help to return back to work    Baseline 75/96 (12/24/19); 58/96 (01/26/20); 63/96 (03/22/20)    Time 4    Period Weeks    Status Revised    Target Date 04/20/20      PT LONG TERM GOAL #4   Title Pt will report being able to perform standing/walking activities for at least 30 minutes in order to improve activity tolerance for work.    Baseline able to perform work for 8 hours with 6-7/10 pain in back.    Time 6    Period Weeks    Status Achieved      PT LONG TERM GOAL #5   Title Patient will be able to turn head to 60 deg bil with <3/10 pain to improve ability to turn head while driving    Baseline 65 deg bil 01/05/20    Time 8    Period Weeks    Status Achieved      PT LONG TERM GOAL #6   Title  Patient will be able to carry 40lbs for about 20 lbs to be able to return back to work with modified duty    Baseline able to carry 20 lbs; able to carry 30 lbs safely in clinic, reports carrying 85 lbs at work (03/22/20)    Time 8    Period Weeks    Status Partially Met                 Plan - 03/22/20 1805    Clinical Impression Statement Treatment session focused on reassessing pt's  LTGs. Treatments have been disrupted due to pt's family requiring increased assistance and driving to [place]. Provided manual therapy to address pt's tightness. Pt's ODI score has decreased show overall improvement; however, his FABQ score has increased since last checked. Pt would benefit from 4 more weeks of PT to attempt to reach his LTGs. STG #5, LTG #2 and #3 remain ongoing. Pt has otherwise met or partially met LTG #1, #4, #5, and #6.    Personal Factors and Comorbidities Comorbidity 2    Comorbidities HLD, HTN, diabetes, hx of sinusitis    Examination-Activity Limitations Bed Mobility;Bend;Locomotion Level;Caring for Others;Stand;Stairs;Squat;Transfers;Sit    Examination-Participation Restrictions Community Activity    Stability/Clinical Decision Making Evolving/Moderate complexity    Rehab Potential Good    PT Frequency 2x / week    PT Duration 6 weeks    PT Treatment/Interventions ADLs/Self Care Home Management;Gait training;Stair training;Functional mobility training;Therapeutic activities;Therapeutic exercise;Patient/family education;Manual techniques;Passive range of motion    PT Next Visit Plan Manual therapy if indicated. Hip strengthening and stretches. Continue core stabilization exercises. Work on improving pain free deadlifts/functional strengthening/lifting.    PT Home Exercise Plan Access Code: [email or web address]    Consulted and Agree with Plan of Care Patient           Patient will benefit from skilled therapeutic intervention in order to improve the following deficits and impairments:  Abnormal gait, Decreased activity tolerance, Decreased range of motion, Decreased mobility, Decreased strength, Difficulty walking, Hypomobility, Impaired flexibility, Impaired sensation, Increased muscle spasms, Postural dysfunction, Improper body mechanics, Pain  Visit Diagnosis: Acute midline low back pain, unspecified whether sciatica present  Other abnormalities of gait and  mobility  Abnormal posture  Upper back pain     Problem List Patient Active Problem List   Diagnosis Date Noted   Emesis    Headache    Acute recurrent sinusitis    SIRS (systemic inflammatory response syndrome) ([place]) 11/27/2019   Acute back pain 11/27/2019   Fever 11/27/2019   DM (diabetes mellitus), type 2 ([place]) 11/27/2019   History of frequent upper respiratory infection 10/07/2019   Other allergic rhinitis 10/07/2019   Urticaria 10/07/2019   Not well controlled severe persistent asthma 10/06/2019    Alyaan Budzynski April Ma L Ioma Chismar PT, DPT 03/23/2020, 6:16 PM  [facility] [address] [place], [place], [address] Phone: [phone]   Fax:  [phone]  Name: Bryan Lopez MRN: [id number] Date of Birth: 11-14-1960

## 2020-03-29 ENCOUNTER — Ambulatory Visit: Payer: Self-pay

## 2020-03-29 ENCOUNTER — Ambulatory Visit: Payer: BC Managed Care – PPO | Attending: Internal Medicine | Admitting: Physical Therapy

## 2020-03-29 DIAGNOSIS — M5412 Radiculopathy, cervical region: Secondary | ICD-10-CM | POA: Insufficient documentation

## 2020-03-29 DIAGNOSIS — M545 Low back pain, unspecified: Secondary | ICD-10-CM | POA: Insufficient documentation

## 2020-03-29 DIAGNOSIS — R293 Abnormal posture: Secondary | ICD-10-CM | POA: Insufficient documentation

## 2020-03-29 DIAGNOSIS — M542 Cervicalgia: Secondary | ICD-10-CM | POA: Insufficient documentation

## 2020-03-29 DIAGNOSIS — R2689 Other abnormalities of gait and mobility: Secondary | ICD-10-CM | POA: Insufficient documentation

## 2020-03-29 DIAGNOSIS — M549 Dorsalgia, unspecified: Secondary | ICD-10-CM | POA: Insufficient documentation

## 2020-03-31 ENCOUNTER — Ambulatory Visit: Payer: BC Managed Care – PPO | Admitting: Physical Therapy

## 2020-03-31 ENCOUNTER — Ambulatory Visit (INDEPENDENT_AMBULATORY_CARE_PROVIDER_SITE_OTHER): Payer: BC Managed Care – PPO

## 2020-03-31 ENCOUNTER — Other Ambulatory Visit: Payer: Self-pay

## 2020-03-31 DIAGNOSIS — R2689 Other abnormalities of gait and mobility: Secondary | ICD-10-CM | POA: Diagnosis present

## 2020-03-31 DIAGNOSIS — M549 Dorsalgia, unspecified: Secondary | ICD-10-CM | POA: Diagnosis present

## 2020-03-31 DIAGNOSIS — M545 Low back pain, unspecified: Secondary | ICD-10-CM

## 2020-03-31 DIAGNOSIS — M542 Cervicalgia: Secondary | ICD-10-CM

## 2020-03-31 DIAGNOSIS — J4550 Severe persistent asthma, uncomplicated: Secondary | ICD-10-CM | POA: Diagnosis not present

## 2020-03-31 DIAGNOSIS — M5412 Radiculopathy, cervical region: Secondary | ICD-10-CM | POA: Diagnosis present

## 2020-03-31 DIAGNOSIS — R293 Abnormal posture: Secondary | ICD-10-CM

## 2020-03-31 NOTE — Therapy (Signed)
[facility] [address] [place], [place], [address] Phone: [phone]   Fax:  [phone]  Physical Therapy Treatment  Patient Details  Name: Bryan Lopez MRN: [id number] Date of Birth: [birth year] Referring Provider (PT): Eugenie Filler, MD   Encounter Date: 03/31/2020   PT End of Session - 03/31/20 1818    Visit Number 20    Number of Visits 29    Date for PT Re-Evaluation 04/19/20    Authorization Type BCBS; recert 5/88/50 with added cervical diagnosis, PN 05/30/72, Recert 03/30/77 (8 sessions, 2x/week); Recert 67/67 (2x/wk for 4 wks)    PT Start Time 1620    PT Stop Time 1700    PT Time Calculation (min) 40 min    Equipment Utilized During Treatment Gait belt    Activity Tolerance Patient limited by pain    Behavior During Therapy WFL for tasks assessed/performed           Past Medical History:  Diagnosis Date  . Crohn disease ([place])   . Frequent sinus infections   . GERD (gastroesophageal reflux disease)   . History of ear infections   . Hx of bronchitis   . Hyperlipidemia   . Hypertension   . Urticaria     Past Surgical History:  Procedure Laterality Date  . Cardiac Stents    . Sinus Surgeries      There were no vitals filed for this visit.   Subjective Assessment - 03/31/20 1624    Subjective Pt reports he got 6 nerve block shots on each side of his lumbar spine. He states that the first 2 days after he was not getting any sciatic pain but today he is feeling it. Pt states that his brain CT scan demonstrated a clot -- he is to get a CTA in [place]; pt states his MD told him to maintain his current activity. Pt is planning to drive to [facility] monday afternoon. Pt reports his R side sciatica was much improved for a bit after last PT session.    Pertinent History asthma, frequent respiratory infections, HLD, HTN    Limitations Lifting;Standing;Walking;Sitting    How long can you stand comfortably?  approx. 15-20 minutes    How long can you walk comfortably? can walk - it just hurts, can walk smaller household distances    Diagnostic tests MRI cervical, lumbar and thoracic spine 11/27/19: No significant abnormality identified. Mild degenerative changes are present.    Patient Stated Goals wants to be able to bend and move and get back to work    Currently in Pain? Yes    Pain Score 5     Pain Location Back    Pain Onset More than a month ago             Manual therapy:  STW, Bilat lumbar paraspinals, QL  Grade III PA mob L4-L5   Grade III PA mobs C-spine  STW, suboccipitals, upper trap, cervical paraspinals  Manual traction 3x20 sec  Prone  Lumbar extension on elbows x20 sec  Lumbar extension on to hands with overpressure at L5 x10 reps  Hip extension 2x10 bilat with 3#  Front plank 2x20 sec  Sidelying  Hip abduction 2x10 bilat with 3#  Side plank 2x15 sec   Supine  Neck retraction x10  Isometric side flexion 3x10 sec bilat  Neck flexion hold 3x10 sec  PT Short Term Goals - 03/22/20 1806      PT SHORT TERM GOAL #1   Title Pt will undergo further assessment of ODI with LTG to be written as appropriate. ALL STGS DUE 12/29/19    Baseline performed on 12/11/19: 55% = severe disability; 48% on 01/27/20    Time 3    Period Weeks    Status Achieved    Target Date 12/29/19      PT SHORT TERM GOAL #2   Title Pt will report low back pain to a 5/10 or less in order to improve functional mobility.    Baseline 7/10 pain in bakc and neck (12/24/19); 6/10 01/26/20; remains 6/10 prior to PT treatment 11/30 -- improves to 4 or 5 after session    Time 3    Period Weeks    Status Partially Met      PT SHORT TERM GOAL #3   Title Pt will demo proper body mechanics to perform a functional squat in order to improve functional mobility and decr low back pain.    Baseline pt's movements are very guarded; demo safe squat with 35#s on 03/22/20     Time 3    Period Weeks    Status Achieved      PT SHORT TERM GOAL #4   Title Pt will demo 50 deg of cervical rotation bil with pain <4/10 to improve ability to turn head while driving    Baseline 40 deg bil cervical rotation with pain of 8/10    Time 4    Period Weeks    Status Achieved    Target Date 01/12/20      PT SHORT TERM GOAL #5   Title Patient will report 50% improvement in intermittent radicular symptoms in R UE with functional activities    Baseline Pt reports no change since initial PT visit (03/22/20)    Time 4    Period Weeks    Status On-going    Target Date 01/12/20             PT Long Term Goals - 03/23/20 1811      PT LONG TERM GOAL #1   Title Pt will decr ODI score to at least 45% in order to demo decr disability. ALL LTGS DUE 01/19/20    Baseline 55% on 12/11/19, 48% on 01/29/20; 44% on 03/22/20    Time 6    Period Weeks    Status Achieved      PT LONG TERM GOAL #2   Title Pt will report 3/10 or less low back pain in order to improve functional mobility and return to work.    Baseline 6/10 on 01/26/20    Time 4    Period Weeks    Status Revised    Target Date 04/20/20      PT LONG TERM GOAL #3   Title Patient will demo 20 points improvement on FABQ to improve overall function and help to return back to work    Baseline 75/96 (12/24/19); 58/96 (01/26/20); 63/96 (03/22/20)    Time 4    Period Weeks    Status Revised    Target Date 04/20/20      PT LONG TERM GOAL #4   Title Pt will report being able to perform standing/walking activities for at least 30 minutes in order to improve activity tolerance for work.    Baseline able to perform work for 8 hours with 6-7/10 pain in back.    Time  6    Period Weeks    Status Achieved      PT LONG TERM GOAL #5   Title Patient will be able to turn head to 60 deg bil with <3/10 pain to improve ability to turn head while driving    Baseline 65 deg bil 01/05/20    Time 8    Period Weeks    Status Achieved       PT LONG TERM GOAL #6   Title Patient will be able to carry 40lbs for about 20 lbs to be able to return back to work with modified duty    Baseline able to carry 20 lbs; able to carry 30 lbs safely in clinic, reports carrying 85 lbs at work (03/22/20)    Time 8    Period Weeks    Status Partially Met                 Plan - 03/31/20 1702    Clinical Impression Statement Treatment focused on addressing pt's back and neck pain with manual therapy, stretching, and strengthening. Progressed pt's strengthening to include planking. Educated pt on stretching and strengthening to address his C-spine bulging discs and radiculopathy.    Personal Factors and Comorbidities Comorbidity 2    Comorbidities HLD, HTN, diabetes, hx of sinusitis    Examination-Activity Limitations Bed Mobility;Bend;Locomotion Level;Caring for Others;Stand;Stairs;Squat;Transfers;Sit    Examination-Participation Restrictions Community Activity    Stability/Clinical Decision Making Evolving/Moderate complexity    Rehab Potential Good    PT Frequency 2x / week    PT Duration 6 weeks    PT Treatment/Interventions ADLs/Self Care Home Management;Gait training;Stair training;Functional mobility training;Therapeutic activities;Therapeutic exercise;Patient/family education;Manual techniques;Passive range of motion    PT Next Visit Plan How were the motions to reduce radiculopathy? Manual therapy if indicated. Hip strengthening and stretches. Continue core stabilization exercises. Work on improving pain free deadlifts/functional strengthening/lifting.    PT Home Exercise Plan Access Code: [email or web address]    Consulted and Agree with Plan of Care Patient           Patient will benefit from skilled therapeutic intervention in order to improve the following deficits and impairments:  Abnormal gait,Decreased activity tolerance,Decreased range of motion,Decreased mobility,Decreased strength,Difficulty walking,Hypomobility,Impaired  flexibility,Impaired sensation,Increased muscle spasms,Postural dysfunction,Improper body mechanics,Pain  Visit Diagnosis: Acute midline low back pain, unspecified whether sciatica present  Other abnormalities of gait and mobility  Abnormal posture  Upper back pain  Neck pain  Radiculopathy, cervical region     Problem List Patient Active Problem List   Diagnosis Date Noted  . Emesis   . Headache   . Acute recurrent sinusitis   . SIRS (systemic inflammatory response syndrome) ([place]) 11/27/2019  . Acute back pain 11/27/2019  . Fever 11/27/2019  . DM (diabetes mellitus), type 2 ([place]) 11/27/2019  . History of frequent upper respiratory infection 10/07/2019  . Other allergic rhinitis 10/07/2019  . Urticaria 10/07/2019  . Not well controlled severe persistent asthma 10/06/2019    [facility] April Ma L [place]  PT, DPT 03/31/2020, 6:20 PM  [facility] [address] [place], [place], [address] Phone: [phone]   Fax:  [phone]  Name: Bryan Lopez MRN: [id number] Date of Birth: 1960/11/16

## 2020-04-05 ENCOUNTER — Ambulatory Visit: Payer: BC Managed Care – PPO | Admitting: Physical Therapy

## 2020-04-07 ENCOUNTER — Encounter: Payer: Self-pay | Admitting: Physical Therapy

## 2020-04-07 ENCOUNTER — Other Ambulatory Visit: Payer: Self-pay

## 2020-04-07 ENCOUNTER — Ambulatory Visit: Payer: BC Managed Care – PPO | Admitting: Physical Therapy

## 2020-04-07 DIAGNOSIS — M545 Low back pain, unspecified: Secondary | ICD-10-CM | POA: Diagnosis not present

## 2020-04-07 DIAGNOSIS — M542 Cervicalgia: Secondary | ICD-10-CM

## 2020-04-07 DIAGNOSIS — M549 Dorsalgia, unspecified: Secondary | ICD-10-CM

## 2020-04-07 DIAGNOSIS — R293 Abnormal posture: Secondary | ICD-10-CM

## 2020-04-07 DIAGNOSIS — R2689 Other abnormalities of gait and mobility: Secondary | ICD-10-CM

## 2020-04-07 DIAGNOSIS — M5412 Radiculopathy, cervical region: Secondary | ICD-10-CM

## 2020-04-07 NOTE — Therapy (Signed)
[facility] [address] [place], [place], [address] Phone: [phone]   Fax:  [phone]  Physical Therapy Treatment  Patient Details  Name: Bryan Lopez MRN: [id number] Date of Birth: November 03, 1960 Referring Provider (PT): Eugenie Filler, MD   Encounter Date: 04/07/2020   PT End of Session - 04/07/20 1614    Visit Number 21    Number of Visits 29    Date for PT Re-Evaluation 04/19/20    Authorization Type [employer]BS; recert 1/32/44 with added cervical diagnosis, PN 0/1/02, Recert 72/5/36 (8 sessions, 2x/week); Recert 64/40 (2x/wk for 4 wks)    PT Start Time 1615    PT Stop Time 1700    PT Time Calculation (min) 45 min    Equipment Utilized During Treatment Gait belt    Activity Tolerance Patient limited by pain    Behavior During Therapy WFL for tasks assessed/performed           Past Medical History:  Diagnosis Date   Crohn disease ([place])    Frequent sinus infections    GERD (gastroesophageal reflux disease)    History of ear infections    Hx of bronchitis    Hyperlipidemia    Hypertension    Urticaria     Past Surgical History:  Procedure Laterality Date   Cardiac Stents     Sinus Surgeries      There were no vitals filed for this visit.   Subjective Assessment - 04/07/20 1621    Subjective Pt states his trip down to [place] wasn't too bad. 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 Pt is to get a biopsy first of January. Pt states he went to a job interview afterward. Pt reports back and neck have not felt horrible. Pt states he was able to take more time on rest breaks to stretch and do exercises. Pt states after working on his neck last week he didn't have any problems.    Pertinent History asthma, frequent respiratory infections, HLD, HTN    Limitations Lifting;Standing;Walking;Sitting    How long can you stand comfortably? approx. 15-20 minutes    How long can you  walk comfortably? can walk - it just hurts, can walk smaller household distances    Diagnostic tests MRI cervical, lumbar and thoracic spine 11/27/19: No significant abnormality identified. Mild degenerative changes are present.    Patient Stated Goals wants to be able to bend and move and get back to work    Currently in Pain? Yes    Pain Score 5     Pain Location Back    Pain Orientation Right;Left    Pain Descriptors / Indicators Tightness    Pain Type Acute pain;Chronic pain    Pain Onset More than a month ago    Pain Score 5    Pain Location Neck    Pain Orientation Right;Left                 Manual therapy:  STW, Bilat lumbar paraspinals, QL  Grade III PA mob L4-L5   Grade III PA mobs C-spine  STW, suboccipitals, upper trap, cervical paraspinals  Seated:  Levator scapulae stretch bilat x30 sec  Upper trap stretch bilat x30 sec  Scapular retraction x10 with 2 sec hold  Thoracic "hug" stretch x10 with 2 sec hold  "L" stretch x20 sec  P-ball flexion with side bend 2x30 sec with TPR on L QL  Supine:  Neck retraction  x10   Prone:  Front plank with hip extension bilat x10 with 3#s on L; unable to perform on R - pt w/ spasm and addressed with stretching & manual                        PT Short Term Goals - 03/22/20 1806      PT SHORT TERM GOAL #1   Title Pt will undergo further assessment of ODI with LTG to be written as appropriate. ALL STGS DUE 12/29/19    Baseline performed on 12/11/19: 55% = severe disability; 48% on 01/27/20    Time 3    Period Weeks    Status Achieved    Target Date 12/29/19      PT SHORT TERM GOAL #2   Title Pt will report low back pain to a 5/10 or less in order to improve functional mobility.    Baseline 7/10 pain in bakc and neck (12/24/19); 6/10 01/26/20; remains 6/10 prior to PT treatment 11/30 -- improves to 4 or 5 after session    Time 3    Period Weeks    Status Partially Met      PT SHORT TERM GOAL #3   Title Pt  will demo proper body mechanics to perform a functional squat in order to improve functional mobility and decr low back pain.    Baseline pt's movements are very guarded; demo safe squat with 35#s on 03/22/20    Time 3    Period Weeks    Status Achieved      PT SHORT TERM GOAL #4   Title Pt will demo 50 deg of cervical rotation bil with pain <4/10 to improve ability to turn head while driving    Baseline 40 deg bil cervical rotation with pain of 8/10    Time 4    Period Weeks    Status Achieved    Target Date 01/12/20      PT SHORT TERM GOAL #5   Title Patient will report 50% improvement in intermittent radicular symptoms in R UE with functional activities    Baseline Pt reports no change since initial PT visit (03/22/20)    Time 4    Period Weeks    Status On-going    Target Date 01/12/20             PT Long Term Goals - 03/23/20 1811      PT LONG TERM GOAL #1   Title Pt will decr ODI score to at least 45% in order to demo decr disability. ALL LTGS DUE 01/19/20    Baseline 55% on 12/11/19, 48% on 01/29/20; 44% on 03/22/20    Time 6    Period Weeks    Status Achieved      PT LONG TERM GOAL #2   Title Pt will report 3/10 or less low back pain in order to improve functional mobility and return to work.    Baseline 6/10 on 01/26/20    Time 4    Period Weeks    Status Revised    Target Date 04/20/20      PT LONG TERM GOAL #3   Title Patient will demo 20 points improvement on FABQ to improve overall function and help to return back to work    Baseline 75/96 (12/24/19); 58/96 (01/26/20); 63/96 (03/22/20)    Time 4    Period Weeks    Status Revised    Target Date 04/20/20  PT LONG TERM GOAL #4   Title Pt will report being able to perform standing/walking activities for at least 30 minutes in order to improve activity tolerance for work.    Baseline able to perform work for 8 hours with 6-7/10 pain in back.    Time 6    Period Weeks    Status Achieved      PT LONG TERM  GOAL #5   Title Patient will be able to turn head to 60 deg bil with <3/10 pain to improve ability to turn head while driving    Baseline 65 deg bil 01/05/20    Time 8    Period Weeks    Status Achieved      PT LONG TERM GOAL #6   Title Patient will be able to carry 40lbs for about 20 lbs to be able to return back to work with modified duty    Baseline able to carry 20 lbs; able to carry 30 lbs safely in clinic, reports carrying 85 lbs at work (03/22/20)    Time 8    Period Weeks    Status Partially Met                 Plan - 04/07/20 1704    Clinical Impression Statement Treatment focused on continuing to progress pt's neck and back. Pt with improving cervical spine flexibility with less trigger points this session. Pt with less radiculopathy noted. Continued stretching and stabilization exercises. Progressed pt's planking to include hip movement with some pt difficulty.    Personal Factors and Comorbidities Comorbidity 2    Comorbidities HLD, HTN, diabetes, hx of sinusitis    Examination-Activity Limitations Bed Mobility;Bend;Locomotion Level;Caring for Others;Stand;Stairs;Squat;Transfers;Sit    Examination-Participation Restrictions Community Activity    Stability/Clinical Decision Making Evolving/Moderate complexity    Rehab Potential Good    PT Frequency 2x / week    PT Duration 6 weeks    PT Treatment/Interventions ADLs/Self Care Home Management;Gait training;Stair training;Functional mobility training;Therapeutic activities;Therapeutic exercise;Patient/family education;Manual techniques;Passive range of motion    PT Next Visit Plan Manual therapy if indicated. Hip strengthening and stretches. Continue core stabilization exercises. Work on improving pain free deadlifts/functional strengthening/lifting.    PT Home Exercise Plan Access Code: [email or web address]    Consulted and Agree with Plan of Care Patient           Patient will benefit from skilled therapeutic intervention in  order to improve the following deficits and impairments:  Abnormal gait,Decreased activity tolerance,Decreased range of motion,Decreased mobility,Decreased strength,Difficulty walking,Hypomobility,Impaired flexibility,Impaired sensation,Increased muscle spasms,Postural dysfunction,Improper body mechanics,Pain  Visit Diagnosis: Acute midline low back pain, unspecified whether sciatica present  Other abnormalities of gait and mobility  Abnormal posture  Upper back pain  Neck pain  Radiculopathy, cervical region     Problem List Patient Active Problem List   Diagnosis Date Noted   Emesis    Headache    Acute recurrent sinusitis    SIRS (systemic inflammatory response syndrome) ([place]) 11/27/2019   Acute back pain 11/27/2019   Fever 11/27/2019   DM (diabetes mellitus), type 2 ([place]) 11/27/2019   History of frequent upper respiratory infection 10/07/2019   Other allergic rhinitis 10/07/2019   Urticaria 10/07/2019   Not well controlled severe persistent asthma 10/06/2019    Markeia Harkless April Ma L Anyra Kaufman PT, DPT 04/07/2020, 5:07 PM  [facility] [address] [place], [place], [address] Phone: [phone]   Fax:  [phone]  Name: Bryan Lopez MRN: [id number]  Date of Birth: [birth year]

## 2020-04-12 ENCOUNTER — Other Ambulatory Visit: Payer: Self-pay

## 2020-04-12 ENCOUNTER — Ambulatory Visit: Payer: BC Managed Care – PPO | Admitting: Physical Therapy

## 2020-04-12 DIAGNOSIS — M542 Cervicalgia: Secondary | ICD-10-CM

## 2020-04-12 DIAGNOSIS — M545 Low back pain, unspecified: Secondary | ICD-10-CM

## 2020-04-12 DIAGNOSIS — M549 Dorsalgia, unspecified: Secondary | ICD-10-CM

## 2020-04-12 DIAGNOSIS — R2689 Other abnormalities of gait and mobility: Secondary | ICD-10-CM

## 2020-04-12 DIAGNOSIS — M5412 Radiculopathy, cervical region: Secondary | ICD-10-CM

## 2020-04-12 DIAGNOSIS — R293 Abnormal posture: Secondary | ICD-10-CM

## 2020-04-12 NOTE — Therapy (Signed)
[facility] [address] [place], [place], [address] Phone: [phone]   Fax:  [phone]  Physical Therapy Treatment  Patient Details  Name: Bryan Lopez MRN: [id number] Date of Birth: [birth year] Referring Provider (PT): Eugenie Filler, MD   Encounter Date: 04/12/2020    Past Medical History:  Diagnosis Date  . Crohn disease ([place])   . Frequent sinus infections   . GERD (gastroesophageal reflux disease)   . History of ear infections   . Hx of bronchitis   . Hyperlipidemia   . Hypertension   . Urticaria     Past Surgical History:  Procedure Laterality Date  . Cardiac Stents    . Sinus Surgeries      There were no vitals filed for this visit.   Subjective Assessment - 04/12/20 1708    Subjective Pt states that his L side back is still sore from the previous session. Pt states it didn't help that at work he has had to lift heavy equipment. Pt states he had time to do some stretches but it remains sore. Pt reports less sciatic pain and more muscle pain.    Pertinent History asthma, frequent respiratory infections, HLD, HTN    Limitations Lifting;Standing;Walking;Sitting    How long can you stand comfortably? approx. 15-20 minutes    How long can you walk comfortably? can walk - it just hurts, can walk smaller household distances    Diagnostic tests MRI cervical, lumbar and thoracic spine 11/27/19: No significant abnormality identified. Mild degenerative changes are present.    Patient Stated Goals wants to be able to bend and move and get back to work    Currently in Pain? Yes    Pain Score 7     Pain Location Back    Pain Orientation Right;Left    Pain Onset More than a month ago    Pain Score 7    Pain Location Neck    Pain Orientation Right;Left            Supine:  LTR 10 x 2 sec hold bilat  Piriformis 3x10 sec bilat  Knee to chest x30 sec bilat  Hamstring stretch x30 sec bilat  Neck AROM  flexion and side bending x5 with 10 sec hold  Seated:  Levator stretch x30 sec bilat  Neck retraction x10  Manual therapy:  Suboccipital release  Cervical traction  Manual upper trap stretch x30 sec bilat  Lumbar MWM into extension L4-5  STW and TPR L QL, lumbar paraspinals, piriformis   Standing:  Lateral hip shift against wall x5 bilat with 10 sec hold                        PT Short Term Goals - 03/22/20 1806      PT SHORT TERM GOAL #1   Title Pt will undergo further assessment of ODI with LTG to be written as appropriate. ALL STGS DUE 12/29/19    Baseline performed on 12/11/19: 55% = severe disability; 48% on 01/27/20    Time 3    Period Weeks    Status Achieved    Target Date 12/29/19      PT SHORT TERM GOAL #2   Title Pt will report low back pain to a 5/10 or less in order to improve functional mobility.    Baseline 7/10 pain in bakc and neck (12/24/19); 6/10 01/26/20; remains 6/10 prior to PT treatment 11/30 -- improves to 4  or 5 after session    Time 3    Period Weeks    Status Partially Met      PT SHORT TERM GOAL #3   Title Pt will demo proper body mechanics to perform a functional squat in order to improve functional mobility and decr low back pain.    Baseline pt's movements are very guarded; demo safe squat with 35#s on 03/22/20    Time 3    Period Weeks    Status Achieved      PT SHORT TERM GOAL #4   Title Pt will demo 50 deg of cervical rotation bil with pain <4/10 to improve ability to turn head while driving    Baseline 40 deg bil cervical rotation with pain of 8/10    Time 4    Period Weeks    Status Achieved    Target Date 01/12/20      PT SHORT TERM GOAL #5   Title Patient will report 50% improvement in intermittent radicular symptoms in R UE with functional activities    Baseline Pt reports no change since initial PT visit (03/22/20)    Time 4    Period Weeks    Status On-going    Target Date 01/12/20             PT Long  Term Goals - 03/23/20 1811      PT LONG TERM GOAL #1   Title Pt will decr ODI score to at least 45% in order to demo decr disability. ALL LTGS DUE 01/19/20    Baseline 55% on 12/11/19, 48% on 01/29/20; 44% on 03/22/20    Time 6    Period Weeks    Status Achieved      PT LONG TERM GOAL #2   Title Pt will report 3/10 or less low back pain in order to improve functional mobility and return to work.    Baseline 6/10 on 01/26/20    Time 4    Period Weeks    Status Revised    Target Date 04/20/20      PT LONG TERM GOAL #3   Title Patient will demo 20 points improvement on FABQ to improve overall function and help to return back to work    Baseline 75/96 (12/24/19); 58/96 (01/26/20); 63/96 (03/22/20)    Time 4    Period Weeks    Status Revised    Target Date 04/20/20      PT LONG TERM GOAL #4   Title Pt will report being able to perform standing/walking activities for at least 30 minutes in order to improve activity tolerance for work.    Baseline able to perform work for 8 hours with 6-7/10 pain in back.    Time 6    Period Weeks    Status Achieved      PT LONG TERM GOAL #5   Title Patient will be able to turn head to 60 deg bil with <3/10 pain to improve ability to turn head while driving    Baseline 65 deg bil 01/05/20    Time 8    Period Weeks    Status Achieved      PT LONG TERM GOAL #6   Title Patient will be able to carry 40lbs for about 20 lbs to be able to return back to work with modified duty    Baseline able to carry 20 lbs; able to carry 30 lbs safely in clinic, reports carrying 85 lbs at work (  03/22/20)    Time 8    Period Weeks    Status Partially Met                  Patient will benefit from skilled therapeutic intervention in order to improve the following deficits and impairments:     Visit Diagnosis: No diagnosis found.     Problem List Patient Active Problem List   Diagnosis Date Noted  . Emesis   . Headache   . Acute recurrent sinusitis    . SIRS (systemic inflammatory response syndrome) ([place]) 11/27/2019  . Acute back pain 11/27/2019  . Fever 11/27/2019  . DM (diabetes mellitus), type 2 ([place]) 11/27/2019  . History of frequent upper respiratory infection 10/07/2019  . Other allergic rhinitis 10/07/2019  . Urticaria 10/07/2019  . Not well controlled severe persistent asthma 10/06/2019    [facility] April Ma L [place] PT, DPT 04/12/2020, 5:15 PM  [facility] [address] [place], [place], [address] Phone: [phone]   Fax:  [phone]  Name: Bentlee Benningfield MRN: [id number] Date of Birth: [birth year]

## 2020-04-14 ENCOUNTER — Ambulatory Visit: Payer: BC Managed Care – PPO | Admitting: Physical Therapy

## 2020-04-14 ENCOUNTER — Ambulatory Visit (INDEPENDENT_AMBULATORY_CARE_PROVIDER_SITE_OTHER): Payer: BC Managed Care – PPO

## 2020-04-14 ENCOUNTER — Other Ambulatory Visit: Payer: Self-pay

## 2020-04-14 DIAGNOSIS — R2689 Other abnormalities of gait and mobility: Secondary | ICD-10-CM

## 2020-04-14 DIAGNOSIS — J4550 Severe persistent asthma, uncomplicated: Secondary | ICD-10-CM | POA: Diagnosis not present

## 2020-04-14 DIAGNOSIS — M545 Low back pain, unspecified: Secondary | ICD-10-CM

## 2020-04-14 DIAGNOSIS — R293 Abnormal posture: Secondary | ICD-10-CM

## 2020-04-14 DIAGNOSIS — M5412 Radiculopathy, cervical region: Secondary | ICD-10-CM

## 2020-04-14 DIAGNOSIS — M542 Cervicalgia: Secondary | ICD-10-CM

## 2020-04-14 DIAGNOSIS — M549 Dorsalgia, unspecified: Secondary | ICD-10-CM

## 2020-04-14 NOTE — Therapy (Signed)
[facility] [address] [place], [place], [address] Phone: [phone]   Fax:  [phone]  Physical Therapy Treatment  Patient Details  Name: Bryan Lopez MRN: [id number] Date of Birth: [birth year] Referring Provider (PT): Eugenie Filler, MD   Encounter Date: 04/14/2020   PT End of Session - 04/14/20 1625    Visit Number 23    Number of Visits 29    Date for PT Re-Evaluation 04/19/20    Authorization Type BCBS; recert 10/17/01 with added cervical diagnosis, PN 5/0/09, Recert 38/1/82 (8 sessions, 2x/week); Recert 99/37 (2x/wk for 4 wks)    PT Start Time 1610    PT Stop Time 1650    PT Time Calculation (min) 40 min    Activity Tolerance Patient limited by pain    Behavior During Therapy WFL for tasks assessed/performed           Past Medical History:  Diagnosis Date   Crohn disease ([place])    Frequent sinus infections    GERD (gastroesophageal reflux disease)    History of ear infections    Hx of bronchitis    Hyperlipidemia    Hypertension    Urticaria     Past Surgical History:  Procedure Laterality Date   Cardiac Stents     Sinus Surgeries      There were no vitals filed for this visit.   Subjective Assessment - 04/14/20 1611    Subjective Pt reports soreness yesterday but all the spots are feeling better today. Pt states he is to get shots in his neck. Pt reports that the sciatica has improved but not all the way better.    Pertinent History asthma, frequent respiratory infections, HLD, HTN    Limitations Lifting;Standing;Walking;Sitting    How long can you stand comfortably? approx. 15-20 minutes    How long can you walk comfortably? can walk - it just hurts, can walk smaller household distances    Diagnostic tests MRI cervical, lumbar and thoracic spine 11/27/19: No significant abnormality identified. Mild degenerative changes are present.    Patient Stated Goals wants to be able to bend  and move and get back to work    Currently in Pain? Yes    Pain Score 4     Pain Location Back    Pain Orientation Right;Left    Pain Descriptors / Indicators Tightness    Pain Type Acute pain;Chronic pain    Pain Onset More than a month ago              [facility] PT Assessment - 04/14/20 0001      Strength   Right Hand Grip (lbs) 82#, 80#, 85#    Left Hand Grip (lbs) 90#, 86#, 87#                         [facility] Adult PT Treatment/Exercise - 04/14/20 0001      Exercises   Exercises Other Exercises      Knee/Hip Exercises: Stretches   Passive Hamstring Stretch Both;2 reps;20 seconds    Piriformis Stretch Both;30 seconds      Knee/Hip Exercises: Machines for Strengthening   Total Gym Leg Press 100# x10; 110# x10; 130# x10      Knee/Hip Exercises: Standing   Hip Abduction Stengthening;Both;2 sets;10 reps    Abduction Limitations 5#    Hip Extension Stengthening;Both;2 sets;10 reps    Extension Limitations 5#    Other Standing Knee Exercises  Hamstring curl 5# 2x10; mini deadlift 12# x10    Other Standing Knee Exercises Squat jumps x10; sumo squats x10                    PT Short Term Goals - 03/22/20 1806      PT SHORT TERM GOAL #1   Title Pt will undergo further assessment of ODI with LTG to be written as appropriate. ALL STGS DUE 12/29/19    Baseline performed on 12/11/19: 55% = severe disability; 48% on 01/27/20    Time 3    Period Weeks    Status Achieved    Target Date 12/29/19      PT SHORT TERM GOAL #2   Title Pt will report low back pain to a 5/10 or less in order to improve functional mobility.    Baseline 7/10 pain in bakc and neck (12/24/19); 6/10 01/26/20; remains 6/10 prior to PT treatment 11/30 -- improves to 4 or 5 after session    Time 3    Period Weeks    Status Partially Met      PT SHORT TERM GOAL #3   Title Pt will demo proper body mechanics to perform a functional squat in order to improve functional mobility and decr low back  pain.    Baseline pt's movements are very guarded; demo safe squat with 35#s on 03/22/20    Time 3    Period Weeks    Status Achieved      PT SHORT TERM GOAL #4   Title Pt will demo 50 deg of cervical rotation bil with pain <4/10 to improve ability to turn head while driving    Baseline 40 deg bil cervical rotation with pain of 8/10    Time 4    Period Weeks    Status Achieved    Target Date 01/12/20      PT SHORT TERM GOAL #5   Title Patient will report 50% improvement in intermittent radicular symptoms in R UE with functional activities    Baseline Pt reports no change since initial PT visit (03/22/20)    Time 4    Period Weeks    Status On-going    Target Date 01/12/20             PT Long Term Goals - 03/23/20 1811      PT LONG TERM GOAL #1   Title Pt will decr ODI score to at least 45% in order to demo decr disability. ALL LTGS DUE 01/19/20    Baseline 55% on 12/11/19, 48% on 01/29/20; 44% on 03/22/20    Time 6    Period Weeks    Status Achieved      PT LONG TERM GOAL #2   Title Pt will report 3/10 or less low back pain in order to improve functional mobility and return to work.    Baseline 6/10 on 01/26/20    Time 4    Period Weeks    Status Revised    Target Date 04/20/20      PT LONG TERM GOAL #3   Title Patient will demo 20 points improvement on FABQ to improve overall function and help to return back to work    Baseline 75/96 (12/24/19); 58/96 (01/26/20); 63/96 (03/22/20)    Time 4    Period Weeks    Status Revised    Target Date 04/20/20      PT LONG TERM GOAL #4   Title Pt will  report being able to perform standing/walking activities for at least 30 minutes in order to improve activity tolerance for work.    Baseline able to perform work for 8 hours with 6-7/10 pain in back.    Time 6    Period Weeks    Status Achieved      PT LONG TERM GOAL #5   Title Patient will be able to turn head to 60 deg bil with <3/10 pain to improve ability to turn head while  driving    Baseline 65 deg bil 01/05/20    Time 8    Period Weeks    Status Achieved      PT LONG TERM GOAL #6   Title Patient will be able to carry 40lbs for about 20 lbs to be able to return back to work with modified duty    Baseline able to carry 20 lbs; able to carry 30 lbs safely in clinic, reports carrying 85 lbs at work (03/22/20)    Time 8    Period Weeks    Status Partially Met                 Plan - 04/14/20 1621    Clinical Impression Statement Pt demos decreased back pain this session. Treatment focused on progressing LE strengthening and core strengthening. Pt performing light plyometrics this session with good tolerance. Pt continuing to make gains towards his PT goals. Radiculopathy remains stable; grip strength is normal for age.    Personal Factors and Comorbidities Comorbidity 2    Comorbidities HLD, HTN, diabetes, hx of sinusitis    Examination-Activity Limitations Bed Mobility;Bend;Locomotion Level;Caring for Others;Stand;Stairs;Squat;Transfers;Sit    Examination-Participation Restrictions Community Activity    Stability/Clinical Decision Making Evolving/Moderate complexity    Rehab Potential Good    PT Frequency 2x / week    PT Duration 6 weeks    PT Treatment/Interventions ADLs/Self Care Home Management;Gait training;Stair training;Functional mobility training;Therapeutic activities;Therapeutic exercise;Patient/family education;Manual techniques;Passive range of motion    PT Next Visit Plan Order for cervical spine? Manual therapy if indicated. Hip strengthening and stretches. Continue core stabilization exercises. Work on improving pain free deadlifts/functional strengthening/lifting. Working on Financial planner.    PT Home Exercise Plan Access Code: [email or web address]    Consulted and Agree with Plan of Care Patient           Patient will benefit from skilled therapeutic intervention in order to improve the following deficits and impairments:  Abnormal gait,Decreased  activity tolerance,Decreased range of motion,Decreased mobility,Decreased strength,Difficulty walking,Hypomobility,Impaired flexibility,Impaired sensation,Increased muscle spasms,Postural dysfunction,Improper body mechanics,Pain  Visit Diagnosis: Acute midline low back pain, unspecified whether sciatica present  Other abnormalities of gait and mobility  Abnormal posture  Upper back pain  Neck pain  Radiculopathy, cervical region     Problem List Patient Active Problem List   Diagnosis Date Noted   Emesis    Headache    Acute recurrent sinusitis    SIRS (systemic inflammatory response syndrome) ([place]) 11/27/2019   Acute back pain 11/27/2019   Fever 11/27/2019   DM (diabetes mellitus), type 2 ([place]) 11/27/2019   History of frequent upper respiratory infection 10/07/2019   Other allergic rhinitis 10/07/2019   Urticaria 10/07/2019   Not well controlled severe persistent asthma 10/06/2019    Manahil Vanzile April Ma L [place]ama [place] PT, DPT 04/14/2020, 5:00 PM  [facility] [address] [place], [place], [address] Phone: [phone]   Fax:  [phone]  Name: Bryan Lopez MRN: [id number] Date of Birth:  06/09/1960 ° ° °

## 2020-04-19 ENCOUNTER — Ambulatory Visit: Payer: BC Managed Care – PPO | Admitting: Physical Therapy

## 2020-04-21 ENCOUNTER — Other Ambulatory Visit: Payer: Self-pay

## 2020-04-21 ENCOUNTER — Ambulatory Visit: Payer: BC Managed Care – PPO | Admitting: Physical Therapy

## 2020-04-21 ENCOUNTER — Telehealth: Payer: Self-pay

## 2020-04-21 DIAGNOSIS — M549 Dorsalgia, unspecified: Secondary | ICD-10-CM

## 2020-04-21 DIAGNOSIS — R2689 Other abnormalities of gait and mobility: Secondary | ICD-10-CM

## 2020-04-21 DIAGNOSIS — M545 Low back pain, unspecified: Secondary | ICD-10-CM

## 2020-04-21 DIAGNOSIS — M5412 Radiculopathy, cervical region: Secondary | ICD-10-CM

## 2020-04-21 DIAGNOSIS — R293 Abnormal posture: Secondary | ICD-10-CM

## 2020-04-21 NOTE — Therapy (Signed)
[facility] [address] [place], [place], [address] Phone: [phone]   Fax:  [phone]  Physical Therapy Treatment and Discharge  Patient Details  Name: Bryan Lopez MRN: [id number] Date of Birth: 1960-07-07 Referring Provider (PT): Eugenie Filler, MD   Encounter Date: 04/21/2020   PT End of Session - 04/21/20 8341    Visit Number 24    Number of Visits 29    Date for PT Re-Evaluation 04/19/20    Authorization Type [employer]BS; recert 9/62/22 with added cervical diagnosis, PN 12/28/96, Recert 92/1/19 (8 sessions, 2x/week); Recert 41/74 (2x/wk for 4 wks)    PT Start Time 1615    PT Stop Time 1700    PT Time Calculation (min) 45 min    Equipment Utilized During Treatment Gait belt    Activity Tolerance Patient limited by pain    Behavior During Therapy WFL for tasks assessed/performed           Past Medical History:  Diagnosis Date  . Crohn disease ([place])   . Frequent sinus infections   . GERD (gastroesophageal reflux disease)   . History of ear infections   . Hx of bronchitis   . Hyperlipidemia   . Hypertension   . Urticaria     Past Surgical History:  Procedure Laterality Date  . Cardiac Stents    . Sinus Surgeries      There were no vitals filed for this visit.   Subjective Assessment - 04/21/20 1617    Subjective Pt states he hasn't been too bad. Pt states Tuesday was rough -- he got a CT of his sinuses and got his sinuses suctioned. Pt is to get his sinus surgery done the beginning of next year. Pt states he still has a headache from the procedure. Pt reports increased pain on left side after work on Tuesday and has not done anything in the last few days since. Pt states he tried the exercises and it didn't take anything away. Pt states there are plans to get shots in his neck on Jan 17. 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    Pertinent History asthma, frequent respiratory infections, HLD, HTN    Limitations Lifting;Standing;Walking;Sitting    How long can you stand comfortably? approx. 15-20 minutes    How long can you walk comfortably? can walk - it just hurts, can walk smaller household distances    Diagnostic tests MRI cervical, lumbar and thoracic spine 11/27/19: No significant abnormality identified. Mild degenerative changes are present.    Patient Stated Goals wants to be able to bend and move and get back to work    Currently in Pain? Yes    Pain Score 6     Pain Location Back    Pain Orientation Left    Pain Descriptors / Indicators Tightness    Pain Onset More than a month ago            Lumbar extensions against bar x10 with 2 sec hold Lumbar lateral side bend against bar x10 bilat with 2 sec hold Squat 50# x10 holding on crate Carry 50# x115 feet  Manual therapy:  STW and TPR L QL, piriformis, and glute medius  Myofacial release L lower lumbar    [facility] PT Assessment - 04/21/20 0001      Observation/Other Assessments   Oswestry Disability Index  37.7%  Fear Avoidance Belief Questionnaire (FABQ)  53/96                                   PT Short Term Goals - 04/21/20 1643      PT SHORT TERM GOAL #1   Title Pt will undergo further assessment of ODI with LTG to be written as appropriate. ALL STGS DUE 12/29/19    Baseline performed on 12/11/19: 55% = severe disability; 48% on 01/27/20    Time 3    Period Weeks    Status Achieved    Target Date 12/29/19      PT SHORT TERM GOAL #2   Title Pt will report low back pain to a 5/10 or less in order to improve functional mobility.    Baseline 7/10 pain in bakc and neck (12/24/19); 6/10 01/26/20; remains 6/10 prior to PT treatment 11/30 -- improves to 4 or 5 after session    Time 3    Period Weeks    Status Partially Met      PT SHORT TERM GOAL #3   Title Pt will demo proper body mechanics to perform a  functional squat in order to improve functional mobility and decr low back pain.    Baseline pt's movements are very guarded; demo safe squat with 35#s on 03/22/20    Time 3    Period Weeks    Status Achieved      PT SHORT TERM GOAL #4   Title Pt will demo 50 deg of cervical rotation bil with pain <4/10 to improve ability to turn head while driving    Baseline 40 deg bil cervical rotation with pain of 8/10    Time 4    Period Weeks    Status Achieved    Target Date 01/12/20      PT SHORT TERM GOAL #5   Title Patient will report 50% improvement in intermittent radicular symptoms in R UE with functional activities    Baseline Pt reports no change since initial PT visit (03/22/20)    Time 4    Period Weeks    Status Achieved    Target Date 01/12/20             PT Long Term Goals - 04/21/20 1642      PT LONG TERM GOAL #1   Title Pt will decr ODI score to at least 45% in order to demo decr disability. ALL LTGS DUE 01/19/20    Baseline 55% on 12/11/19, 48% on 01/29/20; 44% on 03/22/20; 37.7% on 04/21/20    Time 6    Period Weeks    Status Achieved      PT LONG TERM GOAL #2   Title Pt will report 3/10 or less low back pain in order to improve functional mobility and return to work.    Baseline 6/10 on 01/26/20; remains at 4-5 out of 10 at the lowest    Time 4    Period Weeks    Status Partially [place] #3   Title Patient will demo 20 points improvement on FABQ to improve overall function and help to return back to work    Baseline 75/96 (12/24/19); 58/96 (01/26/20); 63/96 (03/22/20); 53/96 (04/21/20)    Time 4    Period Weeks    Status Revised      PT LONG TERM GOAL #4  Title Pt will report being able to perform standing/walking activities for at least 30 minutes in order to improve activity tolerance for work.    Baseline able to perform work for 8 hours with 6-7/10 pain in back.    Time 6    Period Weeks    Status Achieved      PT LONG TERM GOAL #5    Title Patient will be able to turn head to 60 deg bil with <3/10 pain to improve ability to turn head while driving    Baseline 65 deg bil 01/05/20    Time 8    Period Weeks    Status Achieved      PT LONG TERM GOAL #6   Title Patient will be able to carry 40lbs for about 20 lbs to be able to return back to work with modified duty    Baseline able to carry 20 lbs; able to carry 30 lbs safely in clinic, reports carrying 85 lbs at work (03/22/20); able to carry 50 lbs x115'    Time [place] - 04/21/20 1651    Clinical Impression Statement Pt's back pain continues to flare up with increased worked Research officer, 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 Treatment focused on rechecking pt's goals, manual therapy to manage his pain, strengthening, and educating pt on how to manage back pain at work/with activity. Pt's pain has not decreased much more since last re-assessment; however, pt's overall function has improved. Pt's ODI has further improved to 37.7% and FABQ score has met his LTG. Pt states he feels ready for PT d/c at this time as he has multiple surgeries planned early next year and feels he has enough tools from therapy to manage symptoms at home.    Personal Factors and Comorbidities Comorbidity 2    Comorbidities HLD, HTN, diabetes, hx of sinusitis    Examination-Activity Limitations Bed Mobility;Bend;Locomotion Level;Caring for Others;Stand;Stairs;Squat;Transfers;Sit    Examination-Participation Restrictions Community Activity    Stability/Clinical Decision Making Evolving/Moderate complexity    Rehab Potential Good    PT Frequency 2x / week    PT Duration 6 weeks    PT Treatment/Interventions ADLs/Self Care Home Management;Gait training;Stair training;Functional mobility training;Therapeutic activities;Therapeutic exercise;Patient/family education;Manual techniques;Passive range of motion    PT Next Visit Plan Order for cervical spine?  Manual therapy if indicated. Hip strengthening and stretches. Continue core stabilization exercises. Work on improving pain free deadlifts/functional strengthening/lifting. Working on Financial planner.    PT Home Exercise Plan Access Code: [email or web address]    Consulted and Agree with Plan of Care Patient           Patient will benefit from skilled therapeutic intervention in order to improve the following deficits and impairments:  Abnormal gait,Decreased activity tolerance,Decreased range of motion,Decreased mobility,Decreased strength,Difficulty walking,Hypomobility,Impaired flexibility,Impaired sensation,Increased muscle spasms,Postural dysfunction,Improper body mechanics,Pain  Visit Diagnosis: Acute midline low back pain, unspecified whether sciatica present  Other abnormalities of gait and mobility  Abnormal posture  Upper back pain  Radiculopathy, cervical region     Problem List Patient Active Problem List   Diagnosis Date Noted  . Emesis   . Headache   . Acute recurrent sinusitis   . SIRS (systemic inflammatory response syndrome) ([place]) 11/27/2019  . Acute back pain 11/27/2019  . Fever 11/27/2019  . DM (diabetes mellitus), type 2 ([place]) 11/27/2019  . History of frequent upper respiratory  infection 10/07/2019  . Other allergic rhinitis 10/07/2019  . Urticaria 10/07/2019  . Not well controlled severe persistent asthma 10/06/2019    [facility] April Ma L [place] PT, DPT 04/21/2020, 5:09 PM  [facility] [address] [place], [place], [address] Phone: [phone]   Fax:  [phone]  Name: Jery Hollern MRN: [id number] Date of Birth: 02-20-1961

## 2020-04-21 NOTE — Telephone Encounter (Signed)
Faxed [facility] for Montelukast 10mg  and Trelegy Ellipta INH Powder 200/62.5/25MCG  Fax# [phone]

## 2020-04-25 ENCOUNTER — Ambulatory Visit (INDEPENDENT_AMBULATORY_CARE_PROVIDER_SITE_OTHER): Payer: BC Managed Care – PPO

## 2020-04-25 ENCOUNTER — Other Ambulatory Visit: Payer: Self-pay

## 2020-04-25 ENCOUNTER — Ambulatory Visit: Payer: Self-pay

## 2020-04-25 ENCOUNTER — Ambulatory Visit
Admission: RE | Admit: 2020-04-25 | Discharge: 2020-04-25 | Disposition: A | Discharge status: Home / Self Care | Payer: BC Managed Care – PPO | Source: Ambulatory Visit | Attending: Family Medicine | Admitting: Family Medicine

## 2020-04-25 VITALS — BP 136/80 | HR 102 | Temp 98.6°F | Resp 18 | Ht 67.0 in | Wt 190.0 lb

## 2020-04-25 DIAGNOSIS — R059 Cough, unspecified: Secondary | ICD-10-CM | POA: Insufficient documentation

## 2020-04-25 DIAGNOSIS — J069 Acute upper respiratory infection, unspecified: Secondary | ICD-10-CM | POA: Diagnosis not present

## 2020-04-25 DIAGNOSIS — R5383 Other fatigue: Secondary | ICD-10-CM | POA: Insufficient documentation

## 2020-04-25 DIAGNOSIS — J029 Acute pharyngitis, unspecified: Secondary | ICD-10-CM | POA: Diagnosis present

## 2020-04-25 DIAGNOSIS — R52 Pain, unspecified: Secondary | ICD-10-CM | POA: Diagnosis not present

## 2020-04-25 DIAGNOSIS — R0602 Shortness of breath: Secondary | ICD-10-CM

## 2020-04-25 DIAGNOSIS — R6883 Chills (without fever): Secondary | ICD-10-CM | POA: Diagnosis not present

## 2020-04-25 HISTORY — DX: Occlusion and stenosis of unspecified carotid artery: I65.29

## 2020-04-25 HISTORY — DX: Atherosclerotic heart disease of native coronary artery without angina pectoris: I25.10

## 2020-04-25 HISTORY — DX: Type 2 diabetes mellitus without complications: E11.9

## 2020-04-25 LAB — POCT RAPID STREP A (OFFICE): Rapid Strep A Screen: NEGATIVE

## 2020-04-25 IMAGING — DX DG CHEST 2V
2 series · 2 of 2 positions shown · non-contrast
Comparison: [DATE]

CLINICAL DATA: Cough, short of breath, fatigue

EXAM:
CHEST - 2 VIEW

[chest pa]
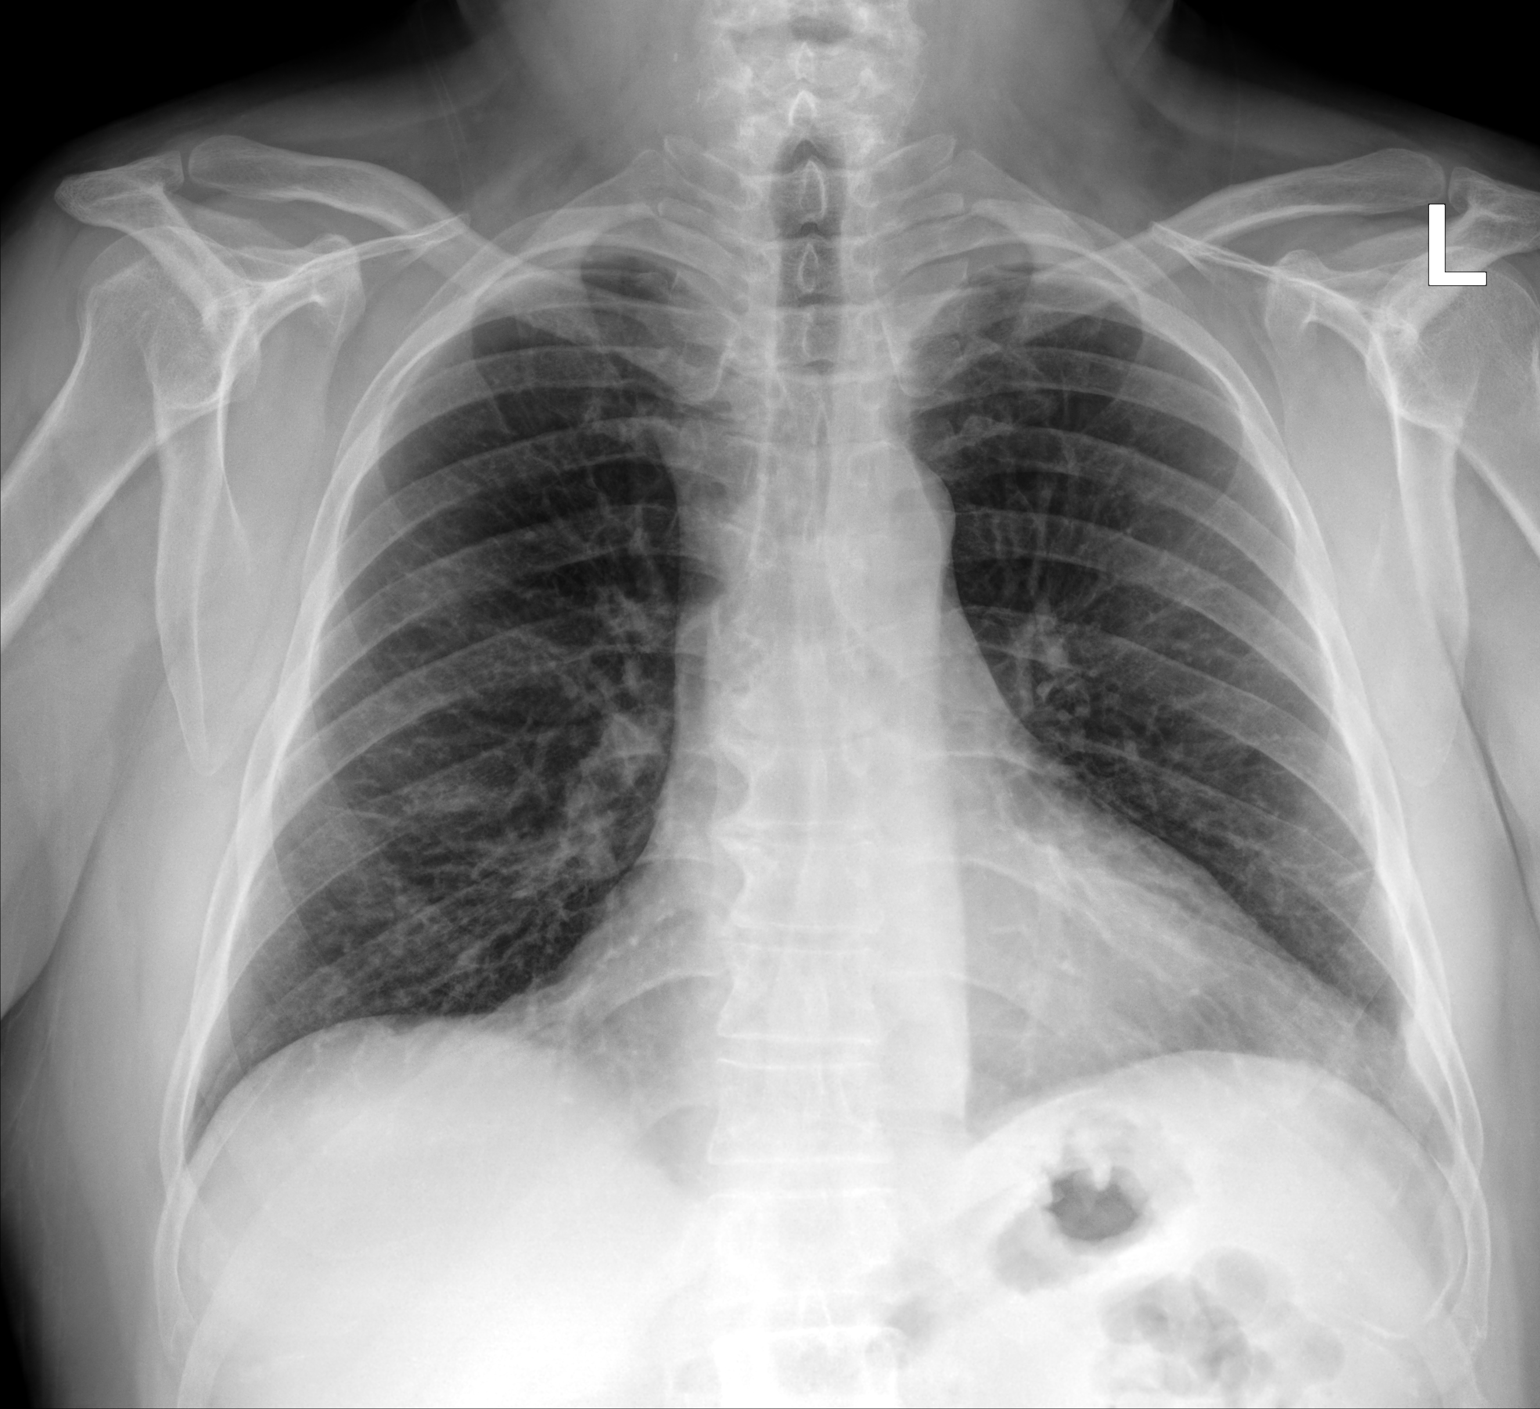

[chest lat]
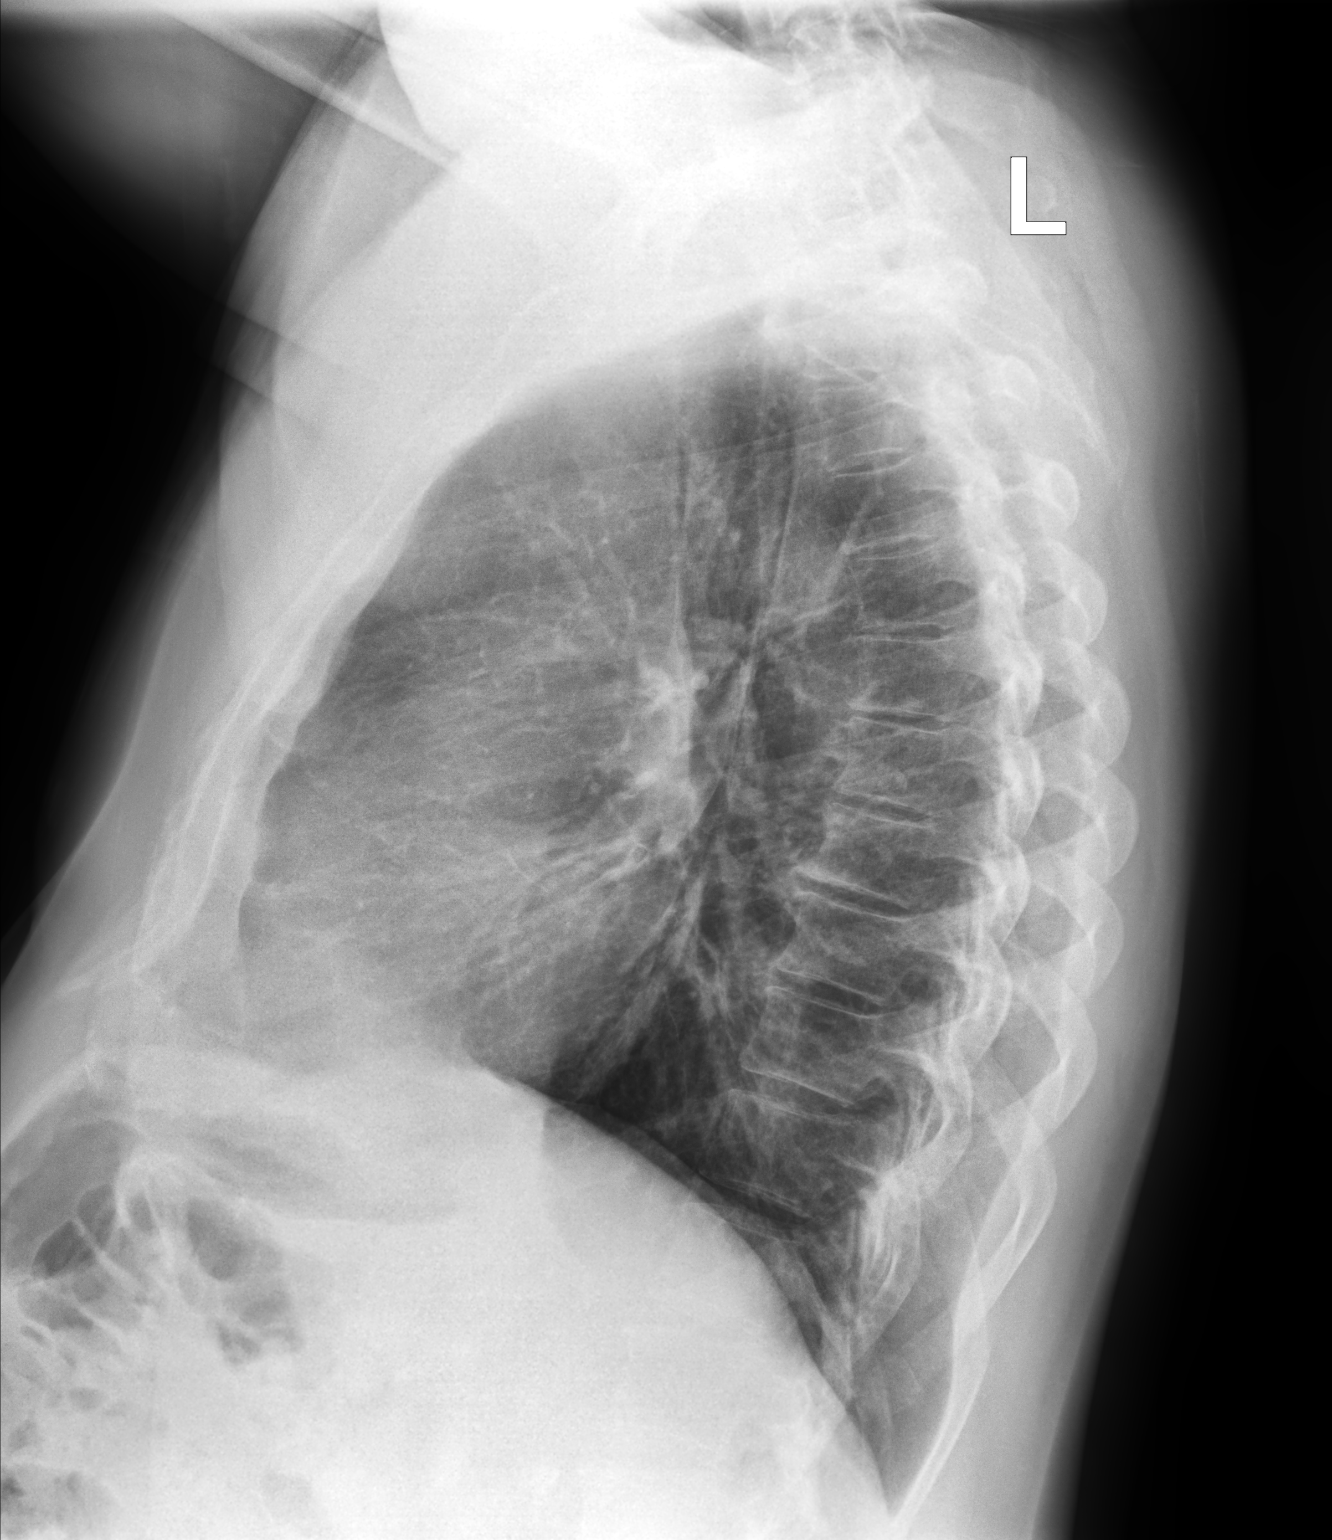

[2 of 2 positions shown; findings below may reference images not displayed]

FINDINGS: The heart size and mediastinal contours are within normal limits.
Both lungs are clear. The visualized skeletal structures are
unremarkable.
IMPRESSION: No active cardiopulmonary disease.

## 2020-04-25 MED ORDER — PROMETHAZINE-DM 6.25-15 MG/5ML PO SYRP: 5.0000 mL | ORAL_SOLUTION | Freq: Four times a day (QID) | ORAL | 0 refills | Status: DC | PRN

## 2020-04-25 NOTE — ED Provider Notes (Signed)
[facility]    CSN: [id number] Arrival date & time: 04/25/20  1742      History   Chief Complaint Chief Complaint  Patient presents with  . Sore Throat    HPI Bryan Lopez is a 60 y.o. male.   Reports that he has been having a cough, sore throat, body aches for the last 3 days. Has not attempted OTC treatment for this. Denies sick contacts. Has negative history of Covid. Has now completed Covid vaccines. States that he has history of pneumonia. Cough is aggravated by activity. Denies nausea, vomiting, diarrhea, rash, fever, other symptoms.  ROS per HPI  The history is provided by the patient.    Past Medical History:  Diagnosis Date  . Carotid artery obstruction   . Coronary artery disease   . Crohn disease ([place])   . Diabetes mellitus without complication ([place])   . Frequent sinus infections   . GERD (gastroesophageal reflux disease)   . History of ear infections   . Hx of bronchitis   . Hyperlipidemia   . Hypertension   . Urticaria     Patient Active Problem List   Diagnosis Date Noted  . Emesis   . Headache   . Acute recurrent sinusitis   . SIRS (systemic inflammatory response syndrome) ([place]) 11/27/2019  . Acute back pain 11/27/2019  . Fever 11/27/2019  . DM (diabetes mellitus), type 2 ([place]) 11/27/2019  . History of frequent upper respiratory infection 10/07/2019  . Other allergic rhinitis 10/07/2019  . Urticaria 10/07/2019  . Not well controlled severe persistent asthma 10/06/2019    Past Surgical History:  Procedure Laterality Date  . Cardiac Stents    . Sinus Surgeries         Home Medications    Prior to Admission medications   Medication Sig Start Date End Date Taking? Authorizing Provider  promethazine-dextromethorphan (PROMETHAZINE-DM) 6.25-15 MG/5ML syrup Take 5 mLs by mouth 4 (four) times daily as needed for cough. 04/25/20  Yes Faustino Congress, NP  acyclovir in dextrose 5 % 250 mL 300 mg/m2. 06/25/18   [provider]  albuterol (VENTOLIN HFA) 108 (90 Base) MCG/ACT inhaler Inhale 1-2 puffs into the lungs every 4 (four) hours as needed for wheezing.  04/11/19   [provider]  atorvastatin (LIPITOR) 80 MG tablet Take 80 mg by mouth daily. 09/26/19   [provider]  Budeson-Glycopyrrol-Formoterol (BREZTRI AEROSPHERE) 160-9-4.8 MCG/ACT AERO Inhale 2 puffs into the lungs in the morning and at bedtime. Rinse mouth after each use. 01/25/20   Garnet Sierras, DO  DUPIXENT 300 MG/2ML prefilled syringe Inject 300 mg into the skin every 14 (fourteen) days.  07/30/19   [provider]  EPINEPHrine 0.3 mg/0.3 mL IJ SOAJ injection Inject 0.3 mg into the muscle as needed for anaphylaxis.  08/13/19   [provider]  fluticasone (FLONASE) 50 MCG/ACT nasal spray Place 1 spray into both nostrils 2 (two) times daily. 09/28/19   [provider]  gabapentin (NEURONTIN) 600 MG tablet Take 1,200 mg by mouth 2 (two) times daily.  09/29/19   [provider]  Insulin Glargine-Lixisenatide (SOLIQUA [place]) Inject 40-60 Units into the skin daily. Depending on CBG's    [provider]  JARDIANCE 25 MG TABS tablet Take 25 mg by mouth daily. 07/20/19   [provider]  montelukast (SINGULAIR) 10 MG tablet Take 10 mg by mouth daily. 06/10/19   [provider]  pantoprazole (PROTONIX) 40 MG tablet Take 40 mg  by mouth daily. 08/04/19   [provider]  pioglitazone (ACTOS) 45 MG tablet 45 mg. 06/25/18   [provider]  valsartan (DIOVAN) 160 MG tablet Take 160 mg by mouth daily. 08/04/19   [provider]    Family History Family History  Problem Relation Age of Onset  . Asthma Father   . Allergic rhinitis Neg Hx   . Atopy Neg Hx     Social History Social History   Tobacco Use  . Smoking status: Never Smoker  . Smokeless tobacco: Never Used  Substance Use Topics  . Alcohol use: Never  . Drug use: Never     Allergies   Acetaminophen and  Metformin and related   Review of Systems Review of Systems   Physical Exam Triage Vital Signs ED Triage Vitals  Enc Vitals Group     BP 04/25/20 1835 136/80     Pulse Rate 04/25/20 1835 (!) 102     Resp 04/25/20 1835 18     Temp 04/25/20 1835 98.6 F (37 C)     Temp Source 04/25/20 1835 Oral     SpO2 04/25/20 1835 95 %     Weight 04/25/20 1833 190 lb (86.2 kg)     Height 04/25/20 1833 5\' 7"  (1.702 m)     Head Circumference --      Peak Flow --      Pain Score 04/25/20 1832 4     Pain Loc --      Pain Edu? --      Excl. in GC? --    No data found.  Updated Vital Signs BP 136/80 (BP Location: Right Arm)   Pulse (!) 102   Temp 98.6 F (37 C) (Oral)   Resp 18   Ht 5\' 7"  (1.702 m)   Wt 190 lb (86.2 kg)   SpO2 95%   BMI 29.76 kg/m   Visual Acuity Right Eye Distance:   Left Eye Distance:   Bilateral Distance:    Right Eye Near:   Left Eye Near:    Bilateral Near:     Physical Exam Vitals and nursing note reviewed.  Constitutional:      General: He is not in acute distress.    Appearance: He is well-developed and well-nourished. He is ill-appearing.  HENT:     Head: Normocephalic and atraumatic.     Right Ear: Tympanic membrane normal.     Left Ear: Tympanic membrane normal.     Nose: Congestion and rhinorrhea present.     Mouth/Throat:     Mouth: Mucous membranes are moist.     Pharynx: Posterior oropharyngeal erythema present.  Eyes:     Conjunctiva/sclera: Conjunctivae normal.  Cardiovascular:     Rate and Rhythm: Regular rhythm. Tachycardia present.     Heart sounds: Normal heart sounds. No murmur heard.   Pulmonary:     Effort: Pulmonary effort is normal. No respiratory distress.     Breath sounds: No stridor. Rhonchi present. No wheezing or rales.  Chest:     Chest wall: No tenderness.  Abdominal:     Palpations: Abdomen is soft.     Tenderness: There is no abdominal tenderness.  Musculoskeletal:        General: No edema.     Cervical  back: Neck supple.  Skin:    General: Skin is warm and dry.     Capillary Refill: Capillary refill takes less than 2 seconds.  Neurological:     General:  No focal deficit present.     Mental Status: He is alert and oriented to person, place, and time.  Psychiatric:        Mood and Affect: Mood and affect and mood normal.        Behavior: Behavior normal.      UC Treatments / Results  Labs (all labs ordered are listed, but only abnormal results are displayed) Labs Reviewed  CULTURE, GROUP A STREP ([facility])  COVID-19, FLU A+B NAA   Narrative:    Test(s) 140142-Influenza A, NAA; 140143-Influenza B, NAA was developed and its performance characteristics determined by [facility]. It has not been cleared or approved by the Food and Drug Administration. Performed at:  [address], [place], [place]  [id number] Lab Director: Jolene Schimke MD, Phone:  [phone]  POCT RAPID STREP A (OFFICE)    EKG   Radiology No results found.  Procedures Procedures (including critical care time)  Medications Ordered in UC Medications - No data to display  Initial Impression / Assessment and Plan / UC Course  I have reviewed the triage vital signs and the nursing notes.  Pertinent labs & imaging results that were available during my care of the patient were reviewed by me and considered in my medical decision making (see chart for details).    Viral URI Cough Chills Fatigue Shortness of breath Body aches Sore Throat  Chest x-ray in office today is negative for any pneumonia Rapid strep negative We will culture and inform of positive results and treat accordingly Prescribed promethazine cough syrup Sedation precautions given Covid and flu testing pending, quarantine until results are back and negative If Covid is positive, quarantine for at least 5 days and follow-up with PCP for further guidelines on whether to stay in isolation May take ibuprofen and Tylenol  as needed Rest and drink plenty of fluids Follow-up with this office or with primary care as needed Follow-up with the ER for trouble swallowing, trouble breathing, other concerning symptoms  Final Clinical Impressions(s) / UC Diagnoses   Final diagnoses:  Generalized body aches  Cough  Chills  Other fatigue  Viral URI  SOB (shortness of breath)  Sore throat     Discharge Instructions     I have sent in cough syrup for you to take. This medication can make you sleepy. Do not drive while taking this medication.  Your COVID and Flu tests are pending.  You should self quarantine until the test results are back.    Take Tylenol or ibuprofen as needed for fever or discomfort.  Rest and keep yourself hydrated.    Follow-up with your primary care provider if your symptoms are not improving.        ED Prescriptions    Medication Sig Dispense Auth. Provider   promethazine-dextromethorphan (PROMETHAZINE-DM) 6.25-15 MG/5ML syrup Take 5 mLs by mouth 4 (four) times daily as needed for cough. 118 mL Moshe Cipro, NP     PDMP not reviewed this encounter.   Moshe Cipro, NP 04/27/20 2159

## 2020-04-25 NOTE — ED Triage Notes (Signed)
Sore throat, body aches, cough x 3 days

## 2020-04-25 NOTE — Discharge Instructions (Signed)
I have sent in cough syrup for you to take. This medication can make you sleepy. Do not drive while taking this medication.  Your COVID and Flu tests are pending.  You should self quarantine until the test results are back.    Take Tylenol or ibuprofen as needed for fever or discomfort.  Rest and keep yourself hydrated.    Follow-up with your primary care provider if your symptoms are not improving.     

## 2020-04-27 LAB — COVID-19, FLU A+B NAA
Influenza A, NAA: NOT DETECTED
Influenza B, NAA: NOT DETECTED
SARS-CoV-2, NAA: NOT DETECTED

## 2020-04-28 ENCOUNTER — Other Ambulatory Visit: Payer: Self-pay

## 2020-04-28 ENCOUNTER — Ambulatory Visit (INDEPENDENT_AMBULATORY_CARE_PROVIDER_SITE_OTHER): Payer: BC Managed Care – PPO

## 2020-04-28 DIAGNOSIS — J4550 Severe persistent asthma, uncomplicated: Secondary | ICD-10-CM | POA: Diagnosis not present

## 2020-04-28 LAB — CULTURE, GROUP A STREP (THRC)

## 2020-05-03 ENCOUNTER — Other Ambulatory Visit: Payer: Self-pay | Admitting: Otolaryngology

## 2020-05-10 ENCOUNTER — Telehealth: Payer: Self-pay

## 2020-05-10 MED ORDER — MONTELUKAST SODIUM 10 MG PO TABS: 10.0000 mg | ORAL_TABLET | Freq: Every day | ORAL | 0 refills | Status: AC

## 2020-05-10 MED ORDER — TRELEGY ELLIPTA 200-62.5-25 MCG/INH IN AEPB: 1.0000 | INHALATION_SPRAY | Freq: Every day | RESPIRATORY_TRACT | 0 refills | Status: AC

## 2020-05-10 NOTE — Telephone Encounter (Signed)
Sent in refills for Montelukast and Trelegy to [facility]

## 2020-05-12 ENCOUNTER — Ambulatory Visit (INDEPENDENT_AMBULATORY_CARE_PROVIDER_SITE_OTHER): Payer: BC Managed Care – PPO

## 2020-05-12 ENCOUNTER — Other Ambulatory Visit: Payer: Self-pay

## 2020-05-12 DIAGNOSIS — J4550 Severe persistent asthma, uncomplicated: Secondary | ICD-10-CM

## 2020-05-23 ENCOUNTER — Encounter (HOSPITAL_BASED_OUTPATIENT_CLINIC_OR_DEPARTMENT_OTHER): Payer: Self-pay | Admitting: Otolaryngology

## 2020-05-23 ENCOUNTER — Other Ambulatory Visit: Payer: Self-pay

## 2020-05-25 NOTE — Progress Notes (Signed)
Chart reviewed with Dr. Stephannie Peters, due to cardiac history, patient will need to be seen by cardiology before proceeding with surgery. Called Dr. Clovis Pu office and left message that patient will need to have cardiology see him first.

## 2020-05-26 ENCOUNTER — Ambulatory Visit (INDEPENDENT_AMBULATORY_CARE_PROVIDER_SITE_OTHER): Payer: BC Managed Care – PPO

## 2020-05-26 ENCOUNTER — Other Ambulatory Visit: Payer: Self-pay

## 2020-05-26 ENCOUNTER — Other Ambulatory Visit (HOSPITAL_COMMUNITY)
Admission: RE | Admit: 2020-05-26 | Discharge: 2020-05-26 | Disposition: A | Discharge status: Home / Self Care | Payer: BC Managed Care – PPO | Source: Ambulatory Visit | Attending: Otolaryngology | Admitting: Otolaryngology

## 2020-05-26 ENCOUNTER — Encounter (HOSPITAL_BASED_OUTPATIENT_CLINIC_OR_DEPARTMENT_OTHER)
Admission: RE | Admit: 2020-05-26 | Discharge: 2020-05-26 | Disposition: A | Discharge status: Home / Self Care | Payer: BC Managed Care – PPO | Source: Ambulatory Visit | Attending: Otolaryngology | Admitting: Otolaryngology

## 2020-05-26 DIAGNOSIS — J4550 Severe persistent asthma, uncomplicated: Secondary | ICD-10-CM

## 2020-05-26 DIAGNOSIS — Z01818 Encounter for other preprocedural examination: Secondary | ICD-10-CM | POA: Diagnosis present

## 2020-05-26 DIAGNOSIS — Z20822 Contact with and (suspected) exposure to covid-19: Secondary | ICD-10-CM | POA: Insufficient documentation

## 2020-05-26 DIAGNOSIS — I10 Essential (primary) hypertension: Secondary | ICD-10-CM | POA: Insufficient documentation

## 2020-05-26 DIAGNOSIS — Z01812 Encounter for preprocedural laboratory examination: Secondary | ICD-10-CM | POA: Insufficient documentation

## 2020-05-26 LAB — BASIC METABOLIC PANEL
Anion gap: 13 (ref 5–15)
BUN: 21 mg/dL (ref 8–23)
CO2: 24 mmol/L (ref 22–32)
Calcium: 9.4 mg/dL (ref 8.9–10.3)
Chloride: 97 mmol/L — ABNORMAL LOW (ref 98–111)
Creatinine, Ser: 0.96 mg/dL (ref 0.61–1.24)
GFR, Estimated: >60 mL/min (ref >60)
Glucose, Bld: 349 mg/dL — ABNORMAL HIGH (ref 70–99)
Potassium: 4.7 mmol/L (ref 3.5–5.1)
Sodium: 134 mmol/L — ABNORMAL LOW (ref 135–145)

## 2020-05-26 LAB — SARS CORONAVIRUS 2 (TAT 6-24 HRS): SARS Coronavirus 2: NEGATIVE

## 2020-05-26 NOTE — Progress Notes (Signed)
Glucose-349, Notified Dr. Richardson Landry, will recheck day of surgery. Notified Kekia at Dr. Thurmon Fair office. Pt notified.

## 2020-05-27 NOTE — Progress Notes (Signed)
Called and spoke with Rana Snare at Dr. Clovis Pu office. Patient will need to see cardiology and at least have an echo completed before proceeding with surgery per Dr. Stephannie Peters.

## 2020-05-30 ENCOUNTER — Ambulatory Visit (HOSPITAL_BASED_OUTPATIENT_CLINIC_OR_DEPARTMENT_OTHER): Admission: RE | Admit: 2020-05-30 | Payer: BC Managed Care – PPO | Source: Home / Self Care | Admitting: Otolaryngology

## 2020-05-30 HISTORY — DX: Unspecified asthma, uncomplicated: J45.909

## 2020-05-30 HISTORY — DX: Sleep apnea, unspecified: G47.30

## 2020-05-30 SURGERY — EXCISION, NASAL TURBINATE, SUBMUCOSAL
Anesthesia: General | Laterality: Bilateral

## 2020-06-09 ENCOUNTER — Other Ambulatory Visit: Payer: Self-pay

## 2020-06-09 ENCOUNTER — Ambulatory Visit (INDEPENDENT_AMBULATORY_CARE_PROVIDER_SITE_OTHER): Payer: BC Managed Care – PPO

## 2020-06-09 DIAGNOSIS — J4550 Severe persistent asthma, uncomplicated: Secondary | ICD-10-CM

## 2020-06-23 ENCOUNTER — Ambulatory Visit (INDEPENDENT_AMBULATORY_CARE_PROVIDER_SITE_OTHER): Payer: BC Managed Care – PPO

## 2020-06-23 ENCOUNTER — Other Ambulatory Visit: Payer: Self-pay

## 2020-06-23 DIAGNOSIS — J4550 Severe persistent asthma, uncomplicated: Secondary | ICD-10-CM | POA: Diagnosis not present

## 2020-07-07 ENCOUNTER — Other Ambulatory Visit: Payer: Self-pay

## 2020-07-07 ENCOUNTER — Ambulatory Visit (INDEPENDENT_AMBULATORY_CARE_PROVIDER_SITE_OTHER): Payer: BC Managed Care – PPO

## 2020-07-07 DIAGNOSIS — J4550 Severe persistent asthma, uncomplicated: Secondary | ICD-10-CM

## 2020-07-21 ENCOUNTER — Ambulatory Visit: Payer: Self-pay

## 2020-07-28 ENCOUNTER — Ambulatory Visit (INDEPENDENT_AMBULATORY_CARE_PROVIDER_SITE_OTHER): Payer: BC Managed Care – PPO

## 2020-07-28 ENCOUNTER — Other Ambulatory Visit: Payer: Self-pay

## 2020-07-28 DIAGNOSIS — J4550 Severe persistent asthma, uncomplicated: Secondary | ICD-10-CM

## 2020-08-11 ENCOUNTER — Other Ambulatory Visit: Payer: Self-pay

## 2020-08-11 ENCOUNTER — Ambulatory Visit (INDEPENDENT_AMBULATORY_CARE_PROVIDER_SITE_OTHER): Payer: BC Managed Care – PPO

## 2020-08-11 DIAGNOSIS — J4550 Severe persistent asthma, uncomplicated: Secondary | ICD-10-CM | POA: Diagnosis not present

## 2020-08-25 ENCOUNTER — Ambulatory Visit: Payer: Self-pay

## 2020-09-01 ENCOUNTER — Ambulatory Visit: Payer: Self-pay

## 2020-09-08 ENCOUNTER — Other Ambulatory Visit: Payer: Self-pay

## 2020-09-08 ENCOUNTER — Ambulatory Visit (INDEPENDENT_AMBULATORY_CARE_PROVIDER_SITE_OTHER): Payer: BC Managed Care – PPO

## 2020-09-08 DIAGNOSIS — J4540 Moderate persistent asthma, uncomplicated: Secondary | ICD-10-CM

## 2020-09-08 DIAGNOSIS — J309 Allergic rhinitis, unspecified: Secondary | ICD-10-CM

## 2020-09-19 NOTE — Progress Notes (Deleted)
Cardiology Office Note:   Date:  09/19/2020  NAME:  Bryan Lopez    MRN: [id number] DOB:  March 13, 1961   PCP:  April Manson, NP  Cardiologist:  None  Electrophysiologist:  None   Referring MD: April Manson, NP   No chief complaint on file. ***  History of Present Illness:   Bryan Lopez is a 60 y.o. male with a hx of DM, CAD, HLD who is being seen today for the evaluation of SOB at the request of White, Bonnell Public, NP.  Problem List 1. DM -A1c 10.2 2. HLD -T chol 153, HDL 45, LDL 92, TG 87 3. HTN 4. CAD 5. Carotidu artery disease   Past Medical History: Past Medical History:  Diagnosis Date  . Asthma   . Carotid artery obstruction   . Coronary artery disease   . Crohn disease ([facility])   . Diabetes mellitus without complication ([facility])   . Frequent sinus infections   . GERD (gastroesophageal reflux disease)   . History of ear infections   . Hx of bronchitis   . Hyperlipidemia   . Hypertension   . Sleep apnea    unable to use cpap   . Urticaria     Past Surgical History: Past Surgical History:  Procedure Laterality Date  . Cardiac Stents    . CAROTID ENDARTERECTOMY    . Sinus Surgeries      Current Medications: No outpatient medications have been marked as taking for the 09/21/20 encounter (Appointment) with Sande Rives, MD.   Current Facility-Administered Medications for the 09/21/20 encounter (Appointment) with O'Neal, Ronnald Ramp, MD  Medication  . dupilumab (DUPIXENT) prefilled syringe 300 mg     Allergies:    Acetaminophen and Metformin and related   Social History: Social History   Socioeconomic History  . Marital status: Single    Spouse name: Not on file  . Number of children: Not on file  . Years of education: Not on file  . Highest education level: Not on file  Occupational History  . Not on file  Tobacco Use  . Smoking status: Never Smoker  . Smokeless tobacco: Never Used  Substance and Sexual Activity  . Alcohol use: Never   . Drug use: Never  . Sexual activity: Not on file  Other Topics Concern  . Not on file  Social History Narrative  . Not on file   Social Determinants of Health   Financial Resource Strain: Not on file  Food Insecurity: Not on file  Transportation Needs: Not on file  Physical Activity: Not on file  Stress: Not on file  Social Connections: Not on file     Family History: The patient's ***family history includes Asthma in his father. There is no history of Allergic rhinitis or Atopy.  ROS:   All other ROS reviewed and negative. Pertinent positives noted in the HPI.     EKGs/Labs/Other Studies Reviewed:   The following studies were personally reviewed by me today:  EKG:  EKG is *** ordered today.  The ekg ordered today demonstrates ***, and was personally reviewed by me.   Recent Labs: 11/28/2019: ALT 18; TSH 1.109 11/30/2019: Magnesium 2.6 12/02/2019: Hemoglobin 10.7; Platelets 366 05/26/2020: BUN 21; Creatinine, Ser 0.96; Potassium 4.7; Sodium 134   Recent Lipid Panel No results found for: CHOL, TRIG, HDL, CHOLHDL, VLDL, LDLCALC, LDLDIRECT  Physical Exam:   VS:  There were no vitals taken for this visit.   Wt Readings from Last 3 Encounters:  04/25/20 190 lb (86.2 kg)  11/27/19 190 lb (86.2 kg)  10/06/19 196 lb (88.9 kg)    General: Well nourished, well developed, in no acute distress Head: Atraumatic, normal size  Eyes: PEERLA, EOMI  Neck: Supple, no JVD Endocrine: No thryomegaly Cardiac: Normal S1, S2; RRR; no murmurs, rubs, or gallops Lungs: Clear to auscultation bilaterally, no wheezing, rhonchi or rales  Abd: Soft, nontender, no hepatomegaly  Ext: No edema, pulses 2+ Musculoskeletal: No deformities, BUE and BLE strength normal and equal Skin: Warm and dry, no rashes   Neuro: Alert and oriented to person, place, time, and situation, CNII-XII grossly intact, no focal deficits  Psych: Normal mood and affect   ASSESSMENT:   Bryan Lopez is a 60 y.o. male who  presents for the following: No diagnosis found.  PLAN:   There are no diagnoses linked to this encounter.  Disposition: No follow-ups on file.  Medication Adjustments/Labs and Tests Ordered: Current medicines are reviewed at length with the patient today.  Concerns regarding medicines are outlined above.  No orders of the defined types were placed in this encounter.  No orders of the defined types were placed in this encounter.   There are no Patient Instructions on file for this visit.   Time Spent with Patient: I have spent a total of *** minutes with patient reviewing hospital notes, telemetry, EKGs, labs and examining the patient as well as establishing an assessment and plan that was discussed with the patient.  > 50% of time was spent in direct patient care.  Signed, Lenna Gilford. Flora Lipps, MD, [facility]  [facility]  [address], [address] [place], [place] [address] [phone]  09/19/2020 4:13 PM

## 2020-09-21 ENCOUNTER — Ambulatory Visit: Payer: BC Managed Care – PPO | Admitting: Cardiovascular Disease

## 2020-09-21 DIAGNOSIS — I2510 Atherosclerotic heart disease of native coronary artery without angina pectoris: Secondary | ICD-10-CM

## 2020-09-21 DIAGNOSIS — R0602 Shortness of breath: Secondary | ICD-10-CM

## 2020-09-21 DIAGNOSIS — E782 Mixed hyperlipidemia: Secondary | ICD-10-CM

## 2020-09-21 DIAGNOSIS — I10 Essential (primary) hypertension: Secondary | ICD-10-CM

## 2020-09-22 ENCOUNTER — Ambulatory Visit: Payer: Self-pay

## 2020-09-27 ENCOUNTER — Encounter: Payer: Self-pay | Admitting: Cardiovascular Disease
